# Patient Record
Sex: Male | Born: 1975
Health system: Southern US, Community
[De-identification: ages and names within clinical notes are randomized; demographics above are authoritative.]

## PROBLEM LIST (undated history)

## (undated) ENCOUNTER — Emergency Department (HOSPITAL_BASED_OUTPATIENT_CLINIC_OR_DEPARTMENT_OTHER): Admission: EM | Payer: BC Managed Care – PPO

## (undated) DIAGNOSIS — G4730 Sleep apnea, unspecified: Secondary | ICD-10-CM

## (undated) DIAGNOSIS — N289 Disorder of kidney and ureter, unspecified: Secondary | ICD-10-CM

## (undated) DIAGNOSIS — N189 Chronic kidney disease, unspecified: Secondary | ICD-10-CM

## (undated) DIAGNOSIS — K219 Gastro-esophageal reflux disease without esophagitis: Secondary | ICD-10-CM

## (undated) DIAGNOSIS — J189 Pneumonia, unspecified organism: Secondary | ICD-10-CM

## (undated) DIAGNOSIS — R7611 Nonspecific reaction to tuberculin skin test without active tuberculosis: Secondary | ICD-10-CM

## (undated) DIAGNOSIS — S62316A Displaced fracture of base of fifth metacarpal bone, right hand, initial encounter for closed fracture: Secondary | ICD-10-CM

## (undated) DIAGNOSIS — I10 Essential (primary) hypertension: Secondary | ICD-10-CM

## (undated) DIAGNOSIS — J45909 Unspecified asthma, uncomplicated: Secondary | ICD-10-CM

## (undated) HISTORY — DX: Chronic kidney disease, unspecified: N18.9

## (undated) HISTORY — DX: Nonspecific reaction to tuberculin skin test without active tuberculosis: R76.11

## (undated) HISTORY — DX: Essential (primary) hypertension: I10

## (undated) HISTORY — DX: Disorder of kidney and ureter, unspecified: N28.9

## (undated) HISTORY — PX: ADENOIDECTOMY: SUR15

## (undated) HISTORY — DX: Displaced fracture of base of fifth metacarpal bone, right hand, initial encounter for closed fracture: S62.316A

## (undated) HISTORY — DX: Gastro-esophageal reflux disease without esophagitis: K21.9

## (undated) HISTORY — DX: Unspecified asthma, uncomplicated: J45.909

---
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
## 2007-04-04 ENCOUNTER — Ambulatory Visit: Payer: Self-pay | Admitting: Internal Medicine

## 2007-04-04 DIAGNOSIS — K219 Gastro-esophageal reflux disease without esophagitis: Secondary | ICD-10-CM | POA: Insufficient documentation

## 2007-04-04 HISTORY — DX: Gastro-esophageal reflux disease without esophagitis: K21.9

## 2007-04-22 ENCOUNTER — Encounter: Payer: Self-pay | Admitting: Internal Medicine

## 2007-04-22 ENCOUNTER — Ambulatory Visit: Payer: Self-pay | Admitting: Internal Medicine

## 2007-04-22 DIAGNOSIS — H1030 Unspecified acute conjunctivitis, unspecified eye: Secondary | ICD-10-CM | POA: Insufficient documentation

## 2007-04-22 LAB — CONVERTED CEMR LAB

## 2007-04-23 LAB — CONVERTED CEMR LAB
Bilirubin, Direct: 0.2 mg/dL (ref 0.0–0.3)
CO2: 31 meq/L (ref 19–32)
Calcium: 9.3 mg/dL (ref 8.4–10.5)
Cholesterol: 186 mg/dL (ref 0–200)
Eosinophils Absolute: 0.4 10*3/uL (ref 0.0–0.6)
Eosinophils Relative: 4.1 % (ref 0.0–5.0)
GFR calc Af Amer: 65 mL/min
GFR calc non Af Amer: 54 mL/min
Glucose, Bld: 93 mg/dL (ref 70–99)
H Pylori IgG: NEGATIVE
Hemoglobin: 16.3 g/dL (ref 13.0–17.0)
Leukocytes, UA: NEGATIVE
Lymphocytes Relative: 33.5 % (ref 12.0–46.0)
MCV: 87.4 fL (ref 78.0–100.0)
Monocytes Absolute: 0.9 10*3/uL — ABNORMAL HIGH (ref 0.2–0.7)
Neutro Abs: 5.2 10*3/uL (ref 1.4–7.7)
Neutrophils Relative %: 52.9 % (ref 43.0–77.0)
Nitrite: NEGATIVE
Platelets: 249 10*3/uL (ref 150–400)
Potassium: 4.2 meq/L (ref 3.5–5.1)
Sodium: 141 meq/L (ref 135–145)
Total CHOL/HDL Ratio: 4.9
Total Protein: 7.5 g/dL (ref 6.0–8.3)
Triglycerides: 156 mg/dL — ABNORMAL HIGH (ref 0–149)
Urine Glucose: NEGATIVE mg/dL
WBC: 10 10*3/uL (ref 4.5–10.5)

## 2007-05-01 ENCOUNTER — Ambulatory Visit: Payer: Self-pay | Admitting: Internal Medicine

## 2007-05-01 DIAGNOSIS — M549 Dorsalgia, unspecified: Secondary | ICD-10-CM | POA: Insufficient documentation

## 2007-05-01 DIAGNOSIS — N259 Disorder resulting from impaired renal tubular function, unspecified: Secondary | ICD-10-CM

## 2007-05-01 HISTORY — DX: Disorder resulting from impaired renal tubular function, unspecified: N25.9

## 2007-05-02 ENCOUNTER — Encounter: Payer: Self-pay | Admitting: Internal Medicine

## 2007-05-06 ENCOUNTER — Encounter: Payer: Self-pay | Admitting: Internal Medicine

## 2007-05-06 LAB — CONVERTED CEMR LAB: Total Protein, Serum Electrophoresis: 7.7 g/dL (ref 6.0–8.3)

## 2007-05-09 ENCOUNTER — Encounter: Admission: RE | Admit: 2007-05-09 | Discharge: 2007-05-09 | Payer: Self-pay | Admitting: Internal Medicine

## 2007-05-13 LAB — CONVERTED CEMR LAB
Albumin, U: DETECTED %
Alpha 1, Urine: DETECTED % — AB
Free Kappa Lt Chains,Ur: 1.86 mg/dL — ABNORMAL HIGH (ref 0.04–1.51)
Free Kappa/Lambda Ratio: 11.63 — ABNORMAL HIGH (ref 0.46–4.00)
Time: 24
Volume, Urine: 900 mL

## 2008-01-17 ENCOUNTER — Ambulatory Visit: Payer: Self-pay | Admitting: Internal Medicine

## 2008-02-24 ENCOUNTER — Ambulatory Visit: Payer: Self-pay | Admitting: Internal Medicine

## 2008-02-24 LAB — CONVERTED CEMR LAB
BUN: 12 mg/dL (ref 6–23)
Bilirubin Urine: NEGATIVE
CO2: 30 meq/L (ref 19–32)
Chloride: 104 meq/L (ref 96–112)
Eosinophils Relative: 1.6 % (ref 0.0–5.0)
Glucose, Bld: 101 mg/dL — ABNORMAL HIGH (ref 70–99)
HCT: 47.4 % (ref 39.0–52.0)
Hemoglobin, Urine: NEGATIVE
Leukocytes, UA: NEGATIVE
Lymphocytes Relative: 34.4 % (ref 12.0–46.0)
Monocytes Absolute: 0.7 10*3/uL (ref 0.1–1.0)
Monocytes Relative: 7.4 % (ref 3.0–12.0)
Nitrite: NEGATIVE
Platelets: 234 10*3/uL (ref 150–400)
Potassium: 4.2 meq/L (ref 3.5–5.1)
RDW: 12.1 % (ref 11.5–14.6)
Sodium: 140 meq/L (ref 135–145)
Total Protein, Urine: NEGATIVE mg/dL
WBC: 9.4 10*3/uL (ref 4.5–10.5)
pH: 7.5 (ref 5.0–8.0)

## 2008-04-24 ENCOUNTER — Encounter: Payer: Self-pay | Admitting: Internal Medicine

## 2008-05-27 ENCOUNTER — Encounter: Payer: Self-pay | Admitting: Internal Medicine

## 2008-09-23 ENCOUNTER — Encounter: Payer: Self-pay | Admitting: Internal Medicine

## 2009-01-20 ENCOUNTER — Encounter: Payer: Self-pay | Admitting: Internal Medicine

## 2009-03-24 ENCOUNTER — Encounter: Payer: Self-pay | Admitting: Internal Medicine

## 2009-09-21 ENCOUNTER — Ambulatory Visit: Payer: Self-pay | Admitting: Internal Medicine

## 2009-09-21 DIAGNOSIS — I10 Essential (primary) hypertension: Secondary | ICD-10-CM

## 2009-09-21 HISTORY — DX: Essential (primary) hypertension: I10

## 2009-09-27 ENCOUNTER — Telehealth: Payer: Self-pay | Admitting: Internal Medicine

## 2009-09-27 ENCOUNTER — Encounter: Payer: Self-pay | Admitting: Internal Medicine

## 2009-09-27 LAB — CONVERTED CEMR LAB
AST: 22 units/L (ref 0–37)
BUN: 9 mg/dL (ref 6–23)
Bilirubin, Direct: 0.1 mg/dL (ref 0.0–0.3)
CO2: 21 meq/L (ref 19–32)
Calcium: 9.3 mg/dL (ref 8.4–10.5)
Cholesterol: 170 mg/dL (ref 0–200)
Glucose, Bld: 88 mg/dL (ref 70–99)
HCT: 47.1 % (ref 39.0–52.0)
Hemoglobin: 15.4 g/dL (ref 13.0–17.0)
Indirect Bilirubin: 0.5 mg/dL (ref 0.0–0.9)
MCHC: 32.7 g/dL (ref 30.0–36.0)
MCV: 85.9 fL (ref 78.0–100.0)
RBC: 5.48 M/uL (ref 4.22–5.81)
Sodium: 139 meq/L (ref 135–145)
TSH: 1.601 microintl units/mL (ref 0.350–4.500)
Total Bilirubin: 0.6 mg/dL (ref 0.3–1.2)
Total CHOL/HDL Ratio: 5.2
WBC: 8.3 10*3/uL (ref 4.0–10.5)

## 2009-10-08 ENCOUNTER — Encounter: Payer: Self-pay | Admitting: Internal Medicine

## 2010-03-09 NOTE — Assessment & Plan Note (Signed)
Summary: cpx/mhf rsc from bump/mhf   Vital Signs:  Patient profile:   35 year old male Height:      71 inches Weight:      249.25 pounds BMI:     34.89 O2 Sat:      97 % on Room air Temp:     98.4 degrees F oral Pulse rate:   81 / minute Pulse rhythm:   regular Resp:     18 per minute BP sitting:   122 / 80  (left arm) Cuff size:   large  Vitals Entered By: Glendell Docker CMA (September 21, 2009 2:48 PM)  O2 Flow:  Room air CC: CPX Is Patient Diabetic? No Pain Assessment Patient in pain? no       Does patient need assistance? Functional Status Self care Ambulation Normal Comments non fasting   Primary Care Kvon Mcilhenny:  Dondra Spry DO  CC:  CPX.  History of Present Illness: 35 y/o AA male for routine CPX relationship with partner ended he is raising his daughter on his own no change in wt  diet is suboptimal not exercising regularly  Preventive Screening-Counseling & Management  Alcohol-Tobacco     Alcohol drinks/day: 0     Smoking Status: never  Caffeine-Diet-Exercise     Caffeine use/day: None     Does Patient Exercise: yes  EKG  Procedure date:  09/21/2009  Findings:      Normal sinus rhythm with rate of:  66 bpm  Allergies (verified): No Known Drug Allergies  Past History:  Past Medical History: History of childhood asthma History of genital warts 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 Never Smoked Alcohol use-no Caffeine use/day:  None Does Patient Exercise:  yes  Review of Systems  The patient denies weight loss, weight gain, chest pain, dyspnea on exertion, prolonged cough,  abdominal pain, melena, hematochezia, severe indigestion/heartburn, and depression.    Physical Exam  General:  alert, well-developed, and well-nourished.   Head:  normocephalic and atraumatic.   Ears:  R ear normal and L ear normal.   Mouth:  Oral mucosa and oropharynx without lesions or exudates.  Teeth in good repair. Neck:  supple and no carotid bruits.   Lungs:  normal respiratory effort and normal breath sounds.   Heart:  normal rate, regular rhythm, no murmur, and no gallop.   Abdomen:  soft, non-tender, normal bowel sounds, and no masses.   Extremities:  No lower extremity edema  Neurologic:  cranial nerves II-XII intact and gait normal.   Psych:  normally interactive, good eye contact, not anxious appearing, and not depressed appearing.     Impression & Recommendations:  Problem # 1:  ROUTINE GENERAL MEDICAL [email or web address]  CARE FACL (ICD-V70.0) Reviewed adult health maintenance protocols. Pt counseled on diet and exercise.  Orders: EKG w/ Interpretation (93000) T-HIV Antibody  (Reflex) ([id number])  Td Booster: Tdap (01/17/2008)   Chol: 186 (04/22/2007)   HDL: 37.9 (04/22/2007)   LDL: 117 (04/22/2007)   TG: 156 (04/22/2007) TSH: 0.64 (04/22/2007)     Td Booster: Tdap (01/17/2008)  Chol: 186 (04/22/2007)   HDL: 37.9 (04/22/2007)   LDL: 117 (04/22/2007)   TG: 156 (04/22/2007) TSH: 0.64 (04/22/2007)     Complete Medication List: 1)  Azor 5-20 Mg Tabs (Amlodipine-olmesartan) .... Take 1 tablet by mouth once a day  Other Orders: T-Basic Metabolic Panel [id number]) T-Hepatic Function [id number]) T-Lipid Profile [id number]) T-CBC No Diff ([id number]) T-TSH ([id number])  Patient Instructions: 1)  Please schedule a follow-up appointment in 1 year. 2)  Limit calorie intake to 2000-2200 cal per day 3)  [email or web address]    Current Allergies (reviewed today): No known allergies

## 2010-03-09 NOTE — Letter (Signed)
   [facility] [address] [place], [place]  [address]  [place] Phone: [phone]      September 27, 2009   [facility] [address] [place], [place] [address]  RE:  LAB RESULTS  Dear  Mr. Dripps,  The following is an interpretation of your most recent lab tests.  Please take note of any instructions provided or changes to medications that have resulted from your lab work.  ELECTROLYTES:  Good - no changes needed  KIDNEY FUNCTION TESTS:  Stable - no changes needed  LIVER FUNCTION TESTS:  Good - no changes needed  LIPID PANEL:  Fair - review at your next visit Triglyceride: 156   Cholesterol: 170   LDL: 106   HDL: 33   Chol/HDL%:  5.2 Ratio  THYROID STUDIES:  Thyroid studies normal TSH: 1.601     CBC:  Good - no changes needed       Sincerely Yours,    Dr. Thomos Lemons  Appended Document:  mailed

## 2010-03-09 NOTE — Letter (Signed)
Summary: [facility]   Imported By: Lanelle Bal 11/04/2009 08:24:28  _____________________________________________________________________  External Attachment:    Type:   Image     Comment:   External Document

## 2010-03-09 NOTE — Progress Notes (Signed)
Summary: Lab Results  Phone Note Outgoing Call   Summary of Call: call pt - HIV is negative.   a letter will be sent re:  other lab results  Initial call taken by: D. Thomos Lemons DO,  September 27, 2009 11:20 PM  Follow-up for Phone Call        attempted to contact patient at [phone], no answer. A detailed voice message was left informing patient per Dr Artist Pais instructions Follow-up by: Glendell Docker CMA,  September 28, 2009 9:06 AM

## 2010-05-25 ENCOUNTER — Telehealth: Payer: Self-pay | Admitting: Internal Medicine

## 2010-05-25 DIAGNOSIS — Z0000 Encounter for general adult medical examination without abnormal findings: Secondary | ICD-10-CM

## 2010-05-25 NOTE — Telephone Encounter (Signed)
Pt has a cpe appt with dr. Artist Pais on 6.6.12. Last labs were on 8.22.12. Pt wanted to come in for before august. Please order labs.

## 2010-05-25 NOTE — Telephone Encounter (Signed)
BMET,  FLP, U/A - v70

## 2010-05-26 NOTE — Telephone Encounter (Signed)
Orders entered and faxed to the lab. Left message for pt to return my call.

## 2010-06-07 NOTE — Telephone Encounter (Signed)
Pt notified that orders have been sent to the lab and he was provided with lab hours.

## 2010-06-09 ENCOUNTER — Encounter: Payer: Self-pay | Admitting: Internal Medicine

## 2010-06-10 ENCOUNTER — Ambulatory Visit (INDEPENDENT_AMBULATORY_CARE_PROVIDER_SITE_OTHER): Payer: 59 | Admitting: Internal Medicine

## 2010-06-10 ENCOUNTER — Encounter: Payer: Self-pay | Admitting: Internal Medicine

## 2010-06-10 VITALS — BP 120/90 | HR 74 | Temp 98.4°F | Resp 18 | Ht 71.0 in | Wt 255.0 lb

## 2010-06-10 DIAGNOSIS — Z114 Encounter for screening for human immunodeficiency virus [HIV]: Secondary | ICD-10-CM

## 2010-06-10 DIAGNOSIS — Z1159 Encounter for screening for other viral diseases: Secondary | ICD-10-CM

## 2010-06-10 DIAGNOSIS — R51 Headache: Secondary | ICD-10-CM

## 2010-06-10 DIAGNOSIS — G8929 Other chronic pain: Secondary | ICD-10-CM | POA: Insufficient documentation

## 2010-06-10 MED ORDER — CYCLOBENZAPRINE HCL 5 MG PO TABS: 5.0000 mg | ORAL_TABLET | Freq: Three times a day (TID) | ORAL | Status: DC | PRN

## 2010-06-10 NOTE — Progress Notes (Signed)
  Subjective:    Patient ID: Erik Weber, male    DOB: [birth year], 35 y.o.   MRN: [id number]  HPI  Pt gets frontal headache.  Feeling like a throbbing / pounding sensation.  Sometimes triggered by laughing. Symptoms can last 2 hrs.  Has not tx ed with any otc meds.  No nausea.  No photophobia or sonophobia. Severity - 7 out of 10.  Mild headaches in high school.  No visual changes.   Mild blurry vision.   In has been a while since last eye exam.  Some neck pain.   Review of Systems  Past Medical History  Diagnosis Date  . Childhood asthma   . Genital warts   . Positive TB test   . Hypertension   . Chronic renal insufficiency     History   Social History  . Marital Status: Single    Spouse Name: N/A    Number of Children: N/A  . Years of Education: N/A   Occupational History  . Not on file.   Social History Main Topics  . Smoking status: Never Smoker   . Smokeless tobacco: Not on file  . Alcohol Use: Not on file  . Drug Use: Not on file  . Sexually Active: Not on file   Other Topics Concern  . 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 Tonsillectomy     1980's    Family History  Problem Relation Age of Onset  . Kidney cancer Mother 86  . Hypertension Father     age 27  . Other      no colon, no prostate cancer  . Lung cancer      unlce    No Known Allergies  Current Outpatient Prescriptions on File Prior to Visit  Medication Sig Dispense Refill  . amLODipine-olmesartan (AZOR) 5-20 MG per tablet Take 1 tablet by mouth daily.          BP 120/90  Pulse 74  Temp(Src) 98.4 F (36.9 C) (Oral)  Resp 18  Ht 5\' 11"  (1.803 m)  Wt 255 lb (115.667 kg)  BMI 35.57 kg/m2  SpO2 97%       Objective:   Physical Exam    Constitutional: He is oriented to person, place, and time. He appears well-developed and well-nourished. No distress.  Cardiovascular: Normal rate, regular rhythm and normal heart sounds.   Pulmonary/Chest: Effort normal and breath sounds normal. He has no wheezes. He has no rales.  Neurological: He is oriented to person, place, and time. He displays normal reflexes. No cranial nerve deficit. He exhibits normal muscle tone. Coordination normal.  Skin: Skin is warm and dry.  Psychiatric: His behavior is normal.  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

## 2010-06-10 NOTE — Assessment & Plan Note (Signed)
35 y/o AA male with chronic severe headaches triggered by laughing (7 months) Rule out intracranial lesion - obtain MRI of brain Pt to follow up with eye doctor to rule out ocular etiology Trial of muscle relaxers Patient advised to call office if symptoms persist or worsen. Reassess in 2 wks

## 2010-06-12 ENCOUNTER — Ambulatory Visit
Admission: RE | Admit: 2010-06-12 | Discharge: 2010-06-12 | Disposition: A | Discharge status: Home / Self Care | Payer: 59 | Source: Ambulatory Visit | Attending: Internal Medicine | Admitting: Internal Medicine

## 2010-06-12 DIAGNOSIS — G8929 Other chronic pain: Secondary | ICD-10-CM

## 2010-06-14 LAB — URINALYSIS, MICROSCOPIC ONLY: Crystals: NONE SEEN

## 2010-06-14 LAB — URINALYSIS, ROUTINE W REFLEX MICROSCOPIC
Glucose, UA: NEGATIVE mg/dL
Nitrite: NEGATIVE
Protein, ur: NEGATIVE mg/dL

## 2010-06-24 ENCOUNTER — Ambulatory Visit: Payer: Self-pay | Admitting: Internal Medicine

## 2010-07-13 ENCOUNTER — Encounter: Payer: Self-pay | Admitting: Internal Medicine

## 2010-07-28 ENCOUNTER — Encounter: Payer: Self-pay | Admitting: Internal Medicine

## 2010-08-05 ENCOUNTER — Ambulatory Visit (INDEPENDENT_AMBULATORY_CARE_PROVIDER_SITE_OTHER): Payer: 59 | Admitting: Family

## 2010-08-05 ENCOUNTER — Encounter: Payer: Self-pay | Admitting: Family

## 2010-08-05 DIAGNOSIS — Z206 Contact with and (suspected) exposure to human immunodeficiency virus [HIV]: Secondary | ICD-10-CM

## 2010-08-05 DIAGNOSIS — Z20828 Contact with and (suspected) exposure to other viral communicable diseases: Secondary | ICD-10-CM

## 2010-08-05 DIAGNOSIS — Z0000 Encounter for general adult medical examination without abnormal findings: Secondary | ICD-10-CM

## 2010-08-05 LAB — TSH: TSH: 1.224 u[IU]/mL (ref 0.350–4.500)

## 2010-08-05 LAB — BASIC METABOLIC PANEL
BUN: 15 mg/dL (ref 6–23)
CO2: 27 mEq/L (ref 19–32)
Chloride: 102 mEq/L (ref 96–112)
Creat: 1.65 mg/dL — ABNORMAL HIGH (ref 0.50–1.35)

## 2010-08-05 LAB — HEPATIC FUNCTION PANEL
ALT: 20 U/L (ref 0–53)
Indirect Bilirubin: 0.5 mg/dL (ref 0.0–0.9)
Total Protein: 8 g/dL (ref 6.0–8.3)

## 2010-08-05 LAB — LIPID PANEL
Cholesterol: 204 mg/dL — ABNORMAL HIGH (ref 0–200)
VLDL: 29 mg/dL (ref 0–40)

## 2010-08-05 NOTE — Progress Notes (Signed)
Subjective:    Patient ID: Erik Weber, male    DOB: 04-Jun-1975, 35 y.o.   MRN: [id number]  HPI   Preventative- going to the gym every day.  Diet is "ok".  He is working with a Scientific laboratory technician.  Trying to keep to 2500.   Uses app on phone. Patient is up to date with his tetanus shot.  Pt reports + exposure to someone with known HIV last summer.  He had a neg HIV antibody, but is requesting an HIV viral load.   Review of Systems  Constitutional:       Difficulty losing weight  HENT: Negative for hearing loss.        Told that he has adenoid inflammation and fluid in his ears- Sees Dr. Jearld Fenton at [facility]  Eyes: Negative for visual disturbance.  Respiratory: Negative for shortness of breath.   Cardiovascular: Negative for chest pain.  Gastrointestinal: Negative for nausea, vomiting, diarrhea and blood in stool.  Genitourinary: Negative for dysuria and frequency.  Musculoskeletal: Negative for myalgias and arthralgias.  Neurological: Negative for weakness and numbness.  Hematological: Negative for adenopathy.  Psychiatric/Behavioral:       Denies depression or anxiety   Past Medical History  Diagnosis Date  . Childhood asthma   . Genital warts   . Positive TB test   . Hypertension   . Chronic renal insufficiency     History   Social History  . Marital Status: Single    Spouse Name: N/A    Number of Children: N/A  . Years of Education: N/A   Occupational History  . Not on file.   Social History Main Topics  . Smoking status: Never Smoker   . Smokeless tobacco: Not on file  . Alcohol Use: Not on file  . Drug Use: Not on file  . Sexually Active: Not on file   Other Topics Concern  . 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 Tonsillectomy    1980's    Family History  Problem Relation Age of Onset  . Kidney cancer Mother 5  . Hypertension Father     age 64  . Other      no colon, no prostate cancer  . Lung cancer      unlce    No Known Allergies  Current Outpatient Prescriptions on File Prior to Visit  Medication Sig Dispense Refill  . amLODipine-olmesartan (AZOR) 5-20 MG per tablet Take 1 tablet by mouth daily.        . cyclobenzaprine (FLEXERIL) 5 MG tablet Take 1 tablet (5 mg total) by mouth 3 (three) times daily as needed.  30 tablet  0  . HYDROcodone-acetaminophen (VICODIN) 5-500 MG per tablet Take 1 tablet by mouth every 4 (four) hours as needed. For tooth pain       . ibuprofen (ADVIL,MOTRIN) 600 MG tablet Take 600 mg by mouth every 4 (four) hours as needed. For Tooth extraction pain       . Penicillin V Potassium (PEN-VEE K PO) Take by mouth 4 (four) times daily. X 10 days         BP 126/90  Pulse 74  Temp(Src) 98.5 F (36.9 C) (Oral)  Wt 255 lb (115.667 kg)       Objective:  Physical Exam  Constitutional: He is oriented to person, place, and time. He appears well-developed and well-nourished.  HENT:  Head: Normocephalic and atraumatic.  Right Ear: Tympanic membrane normal.  Left Ear: Tympanic membrane normal.       Some erythema noted in bilatera canals  Eyes: Conjunctivae and EOM are normal. Pupils are equal, round, and reactive to light.  Neck: Normal range of motion. Neck supple. No tracheal deviation present. No thyromegaly present.  Cardiovascular: Normal rate and regular rhythm.   No murmur heard. Pulmonary/Chest: Effort normal and breath sounds normal. No respiratory distress. He has no wheezes. He has no rales.  Abdominal: Soft. Bowel sounds are normal. He exhibits no distension. There is no tenderness. There is no rebound.  Musculoskeletal: Normal range of motion.  Neurological: He is alert and oriented to person, place, and time. Coordination normal.  Skin: Skin is warm and dry.    Psychiatric: He has a normal mood and affect. His behavior is normal. Judgment and thought content normal.          Assessment & Plan:

## 2010-08-05 NOTE — Assessment & Plan Note (Signed)
Pt was counseled on diet, exercise and weight loss.  I recommended that he keep his calories to 1800 or less a day. Will order HIV viral load at patient's request.  Immunizations reviewed and up to date. Patient was counseled on safe sex.

## 2010-08-05 NOTE — Patient Instructions (Addendum)
Try to keep your calories to 1800 a day. Continue your exercise regimen as you are. Please complete your blood work on the first floor.

## 2010-08-06 LAB — CBC
Hemoglobin: 15.5 g/dL (ref 13.0–17.0)
MCH: 28.5 pg (ref 26.0–34.0)
RBC: 5.43 MIL/uL (ref 4.22–5.81)

## 2010-08-13 ENCOUNTER — Telehealth: Payer: Self-pay | Admitting: Family

## 2010-08-13 DIAGNOSIS — N289 Disorder of kidney and ureter, unspecified: Secondary | ICD-10-CM

## 2010-08-13 NOTE — Telephone Encounter (Signed)
Pls call patient and let him know that his kidney function has worsened slightly.  I would like for him to establish with a kidney doctor for consult.  I will make referral and Myriam Jacobson will call him with apt.

## 2010-08-15 NOTE — Telephone Encounter (Signed)
Notified pt and he states he sees a doctor with [facility] and has a follow up in August.

## 2010-08-29 ENCOUNTER — Telehealth: Payer: Self-pay | Admitting: Family

## 2010-08-29 NOTE — Telephone Encounter (Signed)
Please call pt and let him know that for some reason the HIV RNA Quant screen was not run.  If he would still like to have this test completed, we will need him to return to the lab please.

## 2010-08-29 NOTE — Telephone Encounter (Signed)
Pt notified and states that he does not want to have the test run. He states that he had an HIV screen that was negative in May and is satisfied with that result.

## 2011-01-11 ENCOUNTER — Ambulatory Visit (INDEPENDENT_AMBULATORY_CARE_PROVIDER_SITE_OTHER): Payer: 59 | Admitting: Internal Medicine

## 2011-01-11 ENCOUNTER — Telehealth: Payer: Self-pay | Admitting: Internal Medicine

## 2011-01-11 VITALS — BP 124/94 | Temp 98.1°F | Wt 253.0 lb

## 2011-01-11 DIAGNOSIS — H1030 Unspecified acute conjunctivitis, unspecified eye: Secondary | ICD-10-CM

## 2011-01-11 MED ORDER — TOBRAMYCIN-DEXAMETHASONE 0.3-0.1 % OP OINT: TOPICAL_OINTMENT | Freq: Three times a day (TID) | OPHTHALMIC | Status: DC

## 2011-01-11 NOTE — Progress Notes (Signed)
  Subjective:    Patient ID: Erik Weber, male    DOB: Jan 04, 1976, 35 y.o.   MRN: [id number]  Conjunctivitis  The current episode started yesterday. The onset was sudden. The problem has been gradually worsening. The problem is moderate. Associated symptoms include eye discharge and eye redness. Pertinent negatives include no fever, no decreased vision, no double vision and no photophobia.      Review of Systems  Constitutional: Negative for fever.  Eyes: Positive for discharge and redness. Negative for double vision and photophobia.   Past Medical History  Diagnosis Date  . Childhood asthma   . Genital warts   . Positive TB test   . Hypertension   . Chronic renal insufficiency     History   Social History  . Marital Status: Single    Spouse Name: N/A    Number of Children: N/A  . Years of Education: N/A   Occupational History  . Not on file.   Social History Main Topics  . Smoking status: Never Smoker   . Smokeless tobacco: Not on file  . Alcohol Use: Not on file  . Drug Use: Not on file  . Sexually Active: Not on file   Other Topics Concern  . 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 Tonsillectomy     1980's    Family History  Problem Relation Age of Onset  . Kidney cancer Mother 12  . Hypertension Father     age 51  . Other      no colon, no prostate cancer  . Lung cancer      unlce    No Known Allergies  Current Outpatient Prescriptions on File Prior to Visit  Medication Sig Dispense Refill  . amLODipine-olmesartan (AZOR) 5-20 MG per tablet Take 1 tablet by mouth daily.        . cyclobenzaprine (FLEXERIL) 5 MG tablet Take 1 tablet (5 mg total) by mouth 3 (three) times daily as needed.  30 tablet  0  .  HYDROcodone-acetaminophen (VICODIN) 5-500 MG per tablet Take 1 tablet by mouth every 4 (four) hours as needed. For tooth pain       . ibuprofen (ADVIL,MOTRIN) 600 MG tablet Take 600 mg by mouth every 4 (four) hours as needed. For Tooth extraction pain       . Penicillin V Potassium (PEN-VEE K PO) Take by mouth 4 (four) times daily. X 10 days         BP 124/94  Temp(Src) 98.1 F (36.7 C) (Oral)  Wt 253 lb (114.76 kg)       Objective:   Physical Exam  Constitutional: He appears well-developed and well-nourished.  HENT:  Head: Normocephalic and atraumatic.  Eyes: Pupils are equal, round, and reactive to light.       Bilateral conjunctival injection left greater than right Mild discharge  Cardiovascular: Normal rate, regular rhythm and normal heart sounds.   Pulmonary/Chest: Effort normal. No respiratory distress. He has no wheezes.          Assessment & Plan:

## 2011-01-11 NOTE — Patient Instructions (Signed)
Please call our office if your symptoms do not improve or gets worse.  

## 2011-01-11 NOTE — Telephone Encounter (Signed)
Pt needs to be seen.  We can not call in rx without evaluation

## 2011-01-11 NOTE — Telephone Encounter (Signed)
Pt is sch for today 430pm

## 2011-01-11 NOTE — Telephone Encounter (Signed)
Pt has conjunctivitis and wants an rx called in for him. Please contact

## 2011-01-11 NOTE — Assessment & Plan Note (Signed)
Patient with bilateral conjunctivitis. He reports significant eye discharge especially in AM.  His vision is normal and pupillary responses normal. Treat with TobraDex ointment 3 times a day. Patient advised to call office if symptoms persist or worsen.

## 2011-01-13 ENCOUNTER — Telehealth: Payer: Self-pay | Admitting: *Deleted

## 2011-01-13 MED ORDER — ERYTHROMYCIN 5 MG/GM OP OINT: TOPICAL_OINTMENT | Freq: Four times a day (QID) | OPHTHALMIC | Status: AC

## 2011-01-13 NOTE — Telephone Encounter (Signed)
Patient is calling because the co-pay for his Rx for pink eye will be $100 and he would like to know if there is something else he could try?

## 2011-01-16 NOTE — Telephone Encounter (Signed)
rx of eythromycin ophthalmic called in last week.

## 2011-01-16 NOTE — Telephone Encounter (Signed)
Pt aware.

## 2011-03-31 ENCOUNTER — Other Ambulatory Visit: Payer: 59

## 2011-04-04 ENCOUNTER — Other Ambulatory Visit (INDEPENDENT_AMBULATORY_CARE_PROVIDER_SITE_OTHER): Payer: 59

## 2011-04-04 DIAGNOSIS — Z0000 Encounter for general adult medical examination without abnormal findings: Secondary | ICD-10-CM

## 2011-04-04 LAB — HEPATIC FUNCTION PANEL
Albumin: 4.4 g/dL (ref 3.5–5.2)
Bilirubin, Direct: 0 mg/dL (ref 0.0–0.3)
Total Protein: 7.5 g/dL (ref 6.0–8.3)

## 2011-04-04 LAB — LIPID PANEL: Cholesterol: 189 mg/dL (ref 0–200)

## 2011-04-04 LAB — CBC WITH DIFFERENTIAL/PLATELET
Basophils Relative: 0.8 % (ref 0.0–3.0)
Eosinophils Absolute: 0.3 10*3/uL (ref 0.0–0.7)
MCHC: 32.8 g/dL (ref 30.0–36.0)
MCV: 86.5 fl (ref 78.0–100.0)
Monocytes Absolute: 0.7 10*3/uL (ref 0.1–1.0)
Neutro Abs: 4.2 10*3/uL (ref 1.4–7.7)
Neutrophils Relative %: 46 % (ref 43.0–77.0)
RBC: 5.24 Mil/uL (ref 4.22–5.81)
RDW: 13.8 % (ref 11.5–14.6)

## 2011-04-04 LAB — POCT URINALYSIS DIPSTICK
Leukocytes, UA: NEGATIVE
Protein, UA: NEGATIVE
Urobilinogen, UA: 0.2

## 2011-04-04 LAB — BASIC METABOLIC PANEL
BUN: 16 mg/dL (ref 6–23)
CO2: 27 mEq/L (ref 19–32)
Chloride: 104 mEq/L (ref 96–112)
Creatinine, Ser: 1.5 mg/dL (ref 0.4–1.5)
Glucose, Bld: 82 mg/dL (ref 70–99)

## 2011-04-07 ENCOUNTER — Ambulatory Visit (INDEPENDENT_AMBULATORY_CARE_PROVIDER_SITE_OTHER): Payer: 59 | Admitting: Internal Medicine

## 2011-04-07 ENCOUNTER — Encounter: Payer: Self-pay | Admitting: Internal Medicine

## 2011-04-07 DIAGNOSIS — N259 Disorder resulting from impaired renal tubular function, unspecified: Secondary | ICD-10-CM

## 2011-04-07 DIAGNOSIS — Z0000 Encounter for general adult medical examination without abnormal findings: Secondary | ICD-10-CM

## 2011-04-07 MED ORDER — AMLODIPINE-OLMESARTAN 5-20 MG PO TABS: 1.0000 | ORAL_TABLET | Freq: Every day | ORAL | Status: DC

## 2011-04-07 NOTE — Progress Notes (Signed)
Subjective:    Patient ID: Erik Weber, male    DOB: 02/01/1976, 36 y.o.   MRN: [id number]  HPI  36 year old African American male with hypertension and associated renal insufficiency for routine physical. The patient denies significant interval medical history. His renal function/ Cr has been stable at 1.5. Patient has been trying to exercise on regular basis. He is going to the gym and lifting weights. There has been mild weight gain since previous visit.  No significant change in social history. Denies high-risk sexual contact. HIV screening was negative.  Screening labs reviewed in detail the patient  Review of Systems  Constitutional: Negative for activity change, appetite change  Eyes: Negative for visual disturbance.  Respiratory: Negative for cough, chest tightness and shortness of breath.   Cardiovascular: Negative for chest heaviness  Genitourinary:  increased frequency of urination Neurological: Negative for headaches.  Gastrointestinal: Negative for abdominal pain, heartburn melena or hematochezia Psych: Negative for depression or anxiety  Past Medical History  Diagnosis Date  . Childhood asthma   . Genital warts   . Positive TB test   . Hypertension   . Chronic renal insufficiency     History   Social History  . Marital Status: Single    Spouse Name: N/A    Number of Children: N/A  . Years of Education: N/A   Occupational History  . Not on file.   Social History Main Topics  . Smoking status: Never Smoker   . Smokeless tobacco: Not on file  . Alcohol Use: Not on file  . Drug Use: Not on file  . Sexually Active: Not on file   Other Topics Concern  . 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 Tonsillectomy     1980's     Family History  Problem Relation Age of Onset  . Kidney cancer Mother 63  . Hypertension Father     age 58  . Other      no colon, no prostate cancer  . Lung cancer      unlce    No Known Allergies  No current outpatient prescriptions on file prior to visit.    BP 122/82  Pulse 60  Temp(Src) 98.4 F (36.9 C) (Oral)  Ht 5\' 11"  (1.803 m)  Wt 263 lb (119.296 kg)  BMI 36.68 kg/m2          Objective:   Physical Exam  Constitutional: He is oriented to person, place, and time. He appears well-developed and well-nourished.  HENT:  Head: Normocephalic and atraumatic.  Right Ear: External ear normal.  Mouth/Throat: Oropharynx is clear and moist.  Eyes: Conjunctivae are normal. Pupils are equal, round, and reactive to light.  Neck: Normal range of motion. Neck supple.  Cardiovascular: Normal rate, regular rhythm and normal heart sounds.   No murmur heard. Pulmonary/Chest: Effort normal and breath sounds normal. He has no wheezes. He has no rales.  Abdominal: Soft. Bowel sounds are normal. He exhibits no mass.  Musculoskeletal: Normal range of motion.       Trace edema bilaterally  Neurological: He is alert and oriented to person, place, and time.  Skin: Skin is warm and dry. No rash noted.  Psychiatric: He has a normal mood and affect. His behavior is normal.  Assessment & Plan:

## 2011-04-07 NOTE — Patient Instructions (Signed)
Please complete the following lab tests before your next follow up appointment: BMET - 401.9 

## 2011-04-07 NOTE — Assessment & Plan Note (Signed)
Patient followed by nephrologist-Dr. Lowell Guitar. 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 Stressed importance of compliance with antihypertensives. Also stressed the need to avoid NSAIDs/nephrotoxic agents. Cr is stable Lab Results  Component Value Date   CREATININE 1.5 04/04/2011

## 2011-04-07 NOTE — Assessment & Plan Note (Signed)
Reviewed adult health maintenance protocols. We discussed weight loss strategies. Goal weight approximately 220 pounds. Blood pressure is well controlled.  Lipid panel is within acceptable limits.

## 2011-06-02 ENCOUNTER — Ambulatory Visit (INDEPENDENT_AMBULATORY_CARE_PROVIDER_SITE_OTHER): Payer: 59 | Admitting: Internal Medicine

## 2011-06-02 ENCOUNTER — Encounter: Payer: Self-pay | Admitting: Internal Medicine

## 2011-06-02 VITALS — BP 122/82 | HR 80 | Temp 98.2°F | Wt 272.0 lb

## 2011-06-02 DIAGNOSIS — N529 Male erectile dysfunction, unspecified: Secondary | ICD-10-CM

## 2011-06-02 DIAGNOSIS — R635 Abnormal weight gain: Secondary | ICD-10-CM

## 2011-06-02 LAB — TSH: TSH: 0.53 u[IU]/mL (ref 0.35–5.50)

## 2011-06-02 LAB — T4, FREE: Free T4: 0.87 ng/dL (ref 0.60–1.60)

## 2011-06-02 NOTE — Assessment & Plan Note (Signed)
Abnormal weight gain of unclear etiology. Obtain TSH and free T4. Patient advised to stop weight lifting and increase cardiovascular exercises. Patient also advised to follow low-salt diet.

## 2011-06-02 NOTE — Progress Notes (Signed)
  Subjective:    Patient ID: Erik Weber, male    DOB: 07/08/1975, 36 y.o.   MRN: [id number]  HPI  36 year old African American male with history of hypertension and chronic renal insufficiency complains of unusual weight gain over the last several months. He has not changed his eating habits. He has seen a nutritionist in the past and is following approximately 2500-calorie per day diet. He also exercises on a regular basis which includes weight lifting. He denies lower extremity edema. He denies shortness of breath.  Review of Systems  Positive for fatigue, decreased libido  Past Medical History  Diagnosis Date  . Childhood asthma   . Genital warts   . Positive TB test   . Hypertension   . Chronic renal insufficiency     History   Social History  . Marital Status: Single    Spouse Name: N/A    Number of Children: N/A  . Years of Education: N/A   Occupational History  . Not on file.   Social History Main Topics  . Smoking status: Never Smoker   . Smokeless tobacco: Not on file  . Alcohol Use: Not on file  . Drug Use: Not on file  . Sexually Active: Not on file   Other Topics Concern  . 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 Tonsillectomy     1980's    Family History  Problem Relation Age of Onset  . Kidney cancer Mother 15  . Hypertension Father     age 43  . Other      no colon, no prostate cancer  . Lung cancer      unlce    No Known Allergies  No current outpatient prescriptions on file prior to visit.    BP 122/82  Pulse 80  Temp(Src) 98.2 F (36.8 C) (Oral)  Wt 272 lb (123.378 kg)       Objective:   Physical Exam  Constitutional: He is oriented to person, place, and time. He appears well-developed and  well-nourished.  Cardiovascular: Normal rate, regular rhythm and normal heart sounds.   Pulmonary/Chest: Effort normal and breath sounds normal. He has no wheezes.  Musculoskeletal:       Trace lower extremity edema bilaterally  Neurological: He is alert and oriented to person, place, and time.  Skin: Skin is warm and dry.  Psychiatric: He has a normal mood and affect. His behavior is normal.      Assessment & Plan:

## 2011-06-05 ENCOUNTER — Telehealth: Payer: Self-pay | Admitting: *Deleted

## 2011-06-05 LAB — TESTOSTERONE, FREE, TOTAL, SHBG
Sex Hormone Binding: 17 nmol/L (ref 13–71)
Testosterone-% Free: 2.7 % (ref 1.6–2.9)
Testosterone: 243.48 ng/dL — ABNORMAL LOW (ref 300–890)

## 2011-06-05 NOTE — Progress Notes (Signed)
Pt informed

## 2011-06-05 NOTE — Telephone Encounter (Signed)
I suggest OV to discuss pros and cons of testosterone replacement

## 2011-06-05 NOTE — Telephone Encounter (Signed)
Message was given to pt re: low testosterone and weight loss- pt states he has been working out and losing weight but he states it is not helping low testosterone sx-- wants to know if there is any medication he can take that will help with sx?

## 2011-06-05 NOTE — Telephone Encounter (Signed)
Spoke with patient and he will call back for an office visit

## 2011-06-07 ENCOUNTER — Encounter: Payer: Self-pay | Admitting: Internal Medicine

## 2011-06-07 ENCOUNTER — Ambulatory Visit (INDEPENDENT_AMBULATORY_CARE_PROVIDER_SITE_OTHER): Payer: 59 | Admitting: Internal Medicine

## 2011-06-07 VITALS — BP 134/82 | HR 68 | Temp 98.1°F | Ht 71.0 in | Wt 274.0 lb

## 2011-06-07 DIAGNOSIS — E291 Testicular hypofunction: Secondary | ICD-10-CM

## 2011-06-07 HISTORY — DX: Testicular hypofunction: E29.1

## 2011-06-07 NOTE — Progress Notes (Signed)
  Subjective:    Patient ID: Erik Weber, male    DOB: [birth year], 36 y.o.   MRN: [id number]  HPI  36 year old African American male complains of symptoms of hypogonadism. He notes decreased libido over the last 6-7 months. He initially attributed to stress. He also complains of chronic fatigue. Patient also reports muscles ache after exercise. He denies any recent illness. He has occasional headaches. No changes in vision.  Serum testosterone -  Lab Results  Component Value Date   TESTOSTERONE 243.48* 06/02/2011     Review of Systems Weight gain, mild gynecomastia  Past Medical History  Diagnosis Date  . Childhood asthma   . Genital warts   . Positive TB test   . Hypertension   . Chronic renal insufficiency     History   Social History  . Marital Status: Single    Spouse Name: N/A    Number of Children: N/A  . Years of Education: N/A   Occupational History  . Not on file.   Social History Main Topics  . Smoking status: Never Smoker   . Smokeless tobacco: Not on file  . Alcohol Use: Not on file  . Drug Use: Not on file  . Sexually Active: Not on file   Other Topics Concern  . 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 Tonsillectomy     1980's    Family History  Problem Relation Age of Onset  . Kidney cancer Mother 71  . Hypertension Father     age 12  . Other      no colon, no prostate cancer  . Lung cancer      unlce    No Known Allergies  Current Outpatient Prescriptions on File Prior to Visit  Medication Sig Dispense Refill  . amlodipine-olmesartan (AZOR) 10-20 MG per tablet Take 1 tablet by mouth daily.        BP 134/82  Pulse 68  Temp(Src) 98.1 F (36.7 C) (Oral)  Ht 5\' 11"  (1.803 m)  Wt 274 lb (124.286 kg)   BMI 38.22 kg/m2       Objective:   Physical Exam  Constitutional: He is oriented to person, place, and time. He appears well-developed and well-nourished.  Eyes: EOM are normal. Pupils are equal, round, and reactive to light.       No defect in peripheral vision  Cardiovascular: Normal rate, regular rhythm and normal heart sounds.   Pulmonary/Chest: Effort normal and breath sounds normal. He has no wheezes. He has no rales.       Mild gynecomastia  Genitourinary: Penis normal.       Testicular exam normal  Neurological: He is alert and oriented to person, place, and time. No cranial nerve deficit.  Skin: Skin is warm and dry.  Psychiatric: He has a normal mood and affect. His behavior is normal.       Assessment & Plan:

## 2011-06-07 NOTE — Assessment & Plan Note (Signed)
36 year old [place] American male with mild hypogonadism. Check prolactin, LH, IBC and ferritin level.  His symptoms may be related to obesity. We discussed pros and cons of testosterone therapy. If workup negative we discussed starting testosterone replacement with goal testosterone level between 400-500. Obtain baseline PSA.

## 2011-06-08 LAB — LUTEINIZING HORMONE: LH: 5.37 m[IU]/mL (ref 1.50–9.30)

## 2011-06-08 LAB — PROLACTIN: Prolactin: 8.5 ng/mL (ref 2.1–17.1)

## 2011-06-09 ENCOUNTER — Telehealth: Payer: Self-pay | Admitting: *Deleted

## 2011-06-09 ENCOUNTER — Ambulatory Visit: Payer: 59 | Admitting: Internal Medicine

## 2011-06-09 MED ORDER — TESTOSTERONE 12.5 MG/ACT (1%) TD GEL: 4.0000 | Freq: Every day | TRANSDERMAL | Status: DC

## 2011-06-12 ENCOUNTER — Telehealth: Payer: Self-pay | Admitting: Internal Medicine

## 2011-06-12 MED ORDER — TESTOSTERONE 50 MG/5GM (1%) TD GEL: 5.0000 g | Freq: Every day | TRANSDERMAL | Status: DC

## 2011-06-12 NOTE — Telephone Encounter (Signed)
rx faxed to pharmacy, pt aware 

## 2011-06-12 NOTE — Telephone Encounter (Signed)
Pt is going to call insurance company and find out what they cover and call us back

## 2011-06-12 NOTE — Telephone Encounter (Signed)
Call pt - rx changed to Testim and sent to [facility]

## 2011-06-12 NOTE — Telephone Encounter (Signed)
Pt called back and knows that the testim is covered and wants to know if Dr Artist Pais would change his rx

## 2011-06-12 NOTE — Telephone Encounter (Signed)
Addended by: Simeon Craft on: 06/12/2011 01:37 PM   Modules accepted: Orders, Medications

## 2011-06-12 NOTE — Telephone Encounter (Signed)
Pt said that Androgel in not covered by pts insurance. Pt needs to know what meds are covered? Pt said that Testim is covered, but pt wants to know what Dr Artist Pais would recommend. Pls call.

## 2011-06-13 ENCOUNTER — Telehealth: Payer: Self-pay | Admitting: Internal Medicine

## 2011-06-13 MED ORDER — TESTOSTERONE 50 MG/5GM (1%) TD GEL: 5.0000 g | Freq: Every day | TRANSDERMAL | Status: DC

## 2011-06-13 NOTE — Telephone Encounter (Signed)
rx faxed to pharmacy

## 2011-06-13 NOTE — Telephone Encounter (Signed)
rx called in, pt aware 

## 2011-06-13 NOTE — Telephone Encounter (Signed)
[facility] can not read fax for testosterone please re fax

## 2011-06-13 NOTE — Telephone Encounter (Signed)
Pt contacted the [facility] about testim, [facility] stated they did not receive script pt requesting to have script resent to [facility]

## 2011-07-10 ENCOUNTER — Encounter: Payer: Self-pay | Admitting: Internal Medicine

## 2011-07-10 ENCOUNTER — Ambulatory Visit (INDEPENDENT_AMBULATORY_CARE_PROVIDER_SITE_OTHER): Payer: 59 | Admitting: Internal Medicine

## 2011-07-10 VITALS — BP 154/102 | HR 72 | Temp 98.8°F | Wt 268.0 lb

## 2011-07-10 DIAGNOSIS — E291 Testicular hypofunction: Secondary | ICD-10-CM

## 2011-07-10 DIAGNOSIS — I10 Essential (primary) hypertension: Secondary | ICD-10-CM

## 2011-07-10 NOTE — Assessment & Plan Note (Signed)
The patient has mild increase in blood pressure since starting testosterone replacement. Continue to monitor, if persistent elevation we discussed increasing Azor dose to 40/10.

## 2011-07-10 NOTE — Assessment & Plan Note (Addendum)
Patient using testosterone gel but has not noticed significant change in symptomatology. Repeat testosterone level today. She would like to either consider changing to axiron gel versus testosterone injections. Lab Results  Component Value Date   TESTOSTERONE 243.48* 06/02/2011   Baseline PSA was normal. Plan to repeat PSA in 6 months. Lab Results  Component Value Date   PSA 0.30 06/07/2011

## 2011-07-10 NOTE — Progress Notes (Signed)
  Subjective:    Patient ID: Erik Weber, male    DOB: 12/25/1975, 36 y.o.   MRN: [id number]  HPI  36 year old [place] American male for followup regarding hypogonadism. Patient found to have mildly depressed testosterone levels. Prolactin level and iron studies were normal. LH was also normal. Patient was started on testosterone gel 5 g daily. He has not noticed significant improvement. Application of gel is somewhat inconvenient. He has a 80-year-old son who he he is trying to avoid exposure to gel.  Review of Systems Blood pressure increased,  Negative for chest pain or headache  Past Medical History  Diagnosis Date  . Childhood asthma   . Genital warts   . Positive TB test   . Hypertension   . Chronic renal insufficiency     History   Social History  . Marital Status: Single    Spouse Name: N/A    Number of Children: N/A  . Years of Education: N/A   Occupational History  . Not on file.   Social History Main Topics  . Smoking status: Never Smoker   . Smokeless tobacco: Not on file  . Alcohol Use: Not on file  . Drug Use: Not on file  . Sexually Active: Not on file   Other Topics Concern  . 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 Tonsillectomy     1980's    Family History  Problem Relation Age of Onset  . Kidney cancer Mother 85  . Hypertension Father     age 37  . Other      no colon, no prostate cancer  . Lung cancer      unlce    No Known Allergies  Current Outpatient Prescriptions on File Prior to Visit  Medication Sig Dispense Refill  . amlodipine-olmesartan (AZOR) 10-20 MG per tablet Take 1 tablet by mouth daily.      Marland Kitchen testosterone (TESTIM) 50 MG/5GM GEL Place 5 g onto the skin daily.  5 g  2    BP 154/102   Pulse 72  Temp(Src) 98.8 F (37.1 C) (Oral)  Wt 268 lb (121.564 kg)       Objective:   Physical Exam  Constitutional: He appears well-developed and well-nourished.  Cardiovascular: Normal rate, regular rhythm and normal heart sounds.   Pulmonary/Chest: Effort normal and breath sounds normal. He has no wheezes.  Musculoskeletal: He exhibits no edema.  Skin: Skin is warm and dry.          Assessment & Plan:

## 2011-07-11 LAB — TESTOSTERONE, FREE, TOTAL, SHBG
Sex Hormone Binding: 18 nmol/L (ref 13–71)
Testosterone-% Free: 2.6 % (ref 1.6–2.9)
Testosterone: 235.17 ng/dL — ABNORMAL LOW (ref 300–890)

## 2011-07-13 ENCOUNTER — Other Ambulatory Visit: Payer: Self-pay | Admitting: *Deleted

## 2011-07-13 MED ORDER — TESTOSTERONE CYPIONATE 200 MG/ML IM SOLN: 200.0000 mg | INTRAMUSCULAR | Status: DC

## 2011-07-17 ENCOUNTER — Ambulatory Visit (INDEPENDENT_AMBULATORY_CARE_PROVIDER_SITE_OTHER): Payer: 59 | Admitting: Internal Medicine

## 2011-07-17 DIAGNOSIS — E349 Endocrine disorder, unspecified: Secondary | ICD-10-CM

## 2011-07-17 DIAGNOSIS — E291 Testicular hypofunction: Secondary | ICD-10-CM

## 2011-07-17 MED ORDER — "SYRINGE 22G X 1"" 3 ML MISC": 1.0000 | Status: DC

## 2011-07-17 MED ORDER — TESTOSTERONE CYPIONATE 200 MG/ML IM SOLN
200.0000 mg | Freq: Once | INTRAMUSCULAR | Status: AC
  Administered 2011-07-17: 200 mg via INTRAMUSCULAR

## 2011-07-18 ENCOUNTER — Ambulatory Visit: Payer: 59 | Admitting: Internal Medicine

## 2011-08-14 ENCOUNTER — Telehealth: Payer: Self-pay | Admitting: *Deleted

## 2011-08-14 NOTE — Telephone Encounter (Signed)
Erik Weber is not covered by insurance and he is retaining a lot of fluid.  Pt wants to know you could change his medicine

## 2011-08-15 NOTE — Telephone Encounter (Signed)
Called pt and told pt to call insurance company and find out what is covered and call me back.  Also left some samples up front for him to p/u

## 2011-08-15 NOTE — Telephone Encounter (Signed)
Please find our which ARB/Hctz combo is preferred on his ins co.

## 2011-08-28 ENCOUNTER — Telehealth: Payer: Self-pay | Admitting: *Deleted

## 2011-08-28 MED ORDER — OLMESARTAN MEDOXOMIL-HCTZ 20-12.5 MG PO TABS: 1.0000 | ORAL_TABLET | Freq: Every day | ORAL | Status: DC

## 2011-08-28 NOTE — Telephone Encounter (Signed)
I also suggest OV within 6 wks of starting new medication.  Pt should increase intake of high K foods

## 2011-08-28 NOTE — Telephone Encounter (Signed)
Insurance covers Eprosartan, Losartan, Benicar, and Micardis.  Pt was wanting the Eprosartan cause its the cheapist.  [employer]

## 2011-08-28 NOTE — Telephone Encounter (Signed)
I suggest change to Benicar / Hctz 20/12.5 mg # 30 one po qd.  RF x 2.  Pt will need BMET within 2 wks of starting medication - 401.9

## 2011-08-28 NOTE — Telephone Encounter (Signed)
Pt aware, rx sent in electronically, pt will call back to schedule appts

## 2011-11-02 ENCOUNTER — Telehealth: Payer: Self-pay | Admitting: Internal Medicine

## 2011-11-02 NOTE — Telephone Encounter (Signed)
Pt called and is req to get copy of cpx lab results from 03/2011. Pt needs this for his work. Pls call pt.

## 2011-11-02 NOTE — Telephone Encounter (Signed)
Labs mailed, pt aware

## 2011-12-19 ENCOUNTER — Other Ambulatory Visit: Payer: Self-pay | Admitting: Internal Medicine

## 2012-02-09 ENCOUNTER — Telehealth: Payer: Self-pay | Admitting: Internal Medicine

## 2012-02-09 NOTE — Telephone Encounter (Signed)
Per Dr Artist Pais add PSA, appt scheduled

## 2012-02-09 NOTE — Telephone Encounter (Signed)
Ok for order for free and total testosterone.  Use hypogonadism code

## 2012-02-09 NOTE — Telephone Encounter (Signed)
Patient is requesting to have his testosterone level checked, please advise

## 2012-02-12 ENCOUNTER — Other Ambulatory Visit (INDEPENDENT_AMBULATORY_CARE_PROVIDER_SITE_OTHER): Payer: 59

## 2012-02-12 DIAGNOSIS — E291 Testicular hypofunction: Secondary | ICD-10-CM

## 2012-02-12 LAB — TESTOSTERONE: Testosterone: 249.4 ng/dL — ABNORMAL LOW (ref 350.00–890.00)

## 2012-02-13 ENCOUNTER — Other Ambulatory Visit: Payer: Self-pay | Admitting: Internal Medicine

## 2012-02-13 MED ORDER — TESTOSTERONE 30 MG/ACT TD SOLN: 60.0000 mg | Freq: Every day | TRANSDERMAL | Status: DC

## 2012-02-13 NOTE — Progress Notes (Signed)
Pt is sch for 03-25-2012

## 2012-02-13 NOTE — Progress Notes (Signed)
Could you call pt to schedule testosterone lab? Thanks

## 2012-02-13 NOTE — Progress Notes (Signed)
1-7 lmom for pt to callback

## 2012-02-20 ENCOUNTER — Encounter: Payer: Self-pay | Admitting: *Deleted

## 2012-02-21 ENCOUNTER — Other Ambulatory Visit: Payer: Self-pay | Admitting: Internal Medicine

## 2012-03-14 ENCOUNTER — Other Ambulatory Visit: Payer: Self-pay | Admitting: Internal Medicine

## 2012-03-25 ENCOUNTER — Other Ambulatory Visit: Payer: 59

## 2012-03-25 ENCOUNTER — Encounter: Payer: Self-pay | Admitting: Internal Medicine

## 2012-03-27 ENCOUNTER — Ambulatory Visit (INDEPENDENT_AMBULATORY_CARE_PROVIDER_SITE_OTHER): Payer: 59 | Admitting: Internal Medicine

## 2012-03-27 ENCOUNTER — Encounter: Payer: Self-pay | Admitting: Internal Medicine

## 2012-03-27 VITALS — BP 124/90 | HR 76 | Temp 98.1°F | Wt 278.0 lb

## 2012-03-27 DIAGNOSIS — E291 Testicular hypofunction: Secondary | ICD-10-CM

## 2012-03-27 DIAGNOSIS — J209 Acute bronchitis, unspecified: Secondary | ICD-10-CM

## 2012-03-27 MED ORDER — MOMETASONE FURO-FORMOTEROL FUM 200-5 MCG/ACT IN AERO: 2.0000 | INHALATION_SPRAY | Freq: Two times a day (BID) | RESPIRATORY_TRACT | Status: DC

## 2012-03-27 MED ORDER — HYDROCODONE-HOMATROPINE 5-1.5 MG/5ML PO SYRP: 5.0000 mL | ORAL_SOLUTION | Freq: Three times a day (TID) | ORAL | Status: DC | PRN

## 2012-03-27 MED ORDER — CEFPODOXIME PROXETIL 200 MG PO TABS: 200.0000 mg | ORAL_TABLET | Freq: Two times a day (BID) | ORAL | Status: DC

## 2012-03-27 MED ORDER — AZITHROMYCIN 250 MG PO TABS: ORAL_TABLET | ORAL | Status: DC

## 2012-03-27 NOTE — Assessment & Plan Note (Signed)
Patient has been on depo testosterone for one month. Arrange followup testosterone level and PSA in one month.

## 2012-03-27 NOTE — Patient Instructions (Addendum)
Please contact our office if your symptoms do not improve or gets worse. Return in one month for blood tests: Total testosterone level, PSA - 257.2

## 2012-03-27 NOTE — Assessment & Plan Note (Signed)
37 year old [place] American male with signs symptoms of acute bronchitis. Treat with Vantin 200 mg twice daily for 10 days and azithromycin 250 mg for 5 days. Use sample of Dulera metered-dose inhaler as directed.  Patient advised to call office if symptoms persist or worsen.

## 2012-03-27 NOTE — Progress Notes (Signed)
  Subjective:    Patient ID: Erik Weber, male    DOB: 12-17-1975, 37 y.o.   MRN: [id number]  HPI  37 year old African American male with history of hypertension complains of chest congestion and cough x1 week. His cough is productive of greenish sputum. He reports intermittent wheezing and mild shortness of breath. He denies any sinus pressure.  Hypogonadism-he finally received depo testosterone. He has been using for one month.  Review of Systems Negative for fever or myalgias  Past Medical History  Diagnosis Date  . Childhood asthma   . Genital warts   . Positive TB test   . Hypertension   . Chronic renal insufficiency     History   Social History  . Marital Status: Single    Spouse Name: N/A    Number of Children: N/A  . Years of Education: N/A   Occupational History  . Not on file.   Social History Main Topics  . Smoking status: Never Smoker   . Smokeless tobacco: Not on file  . Alcohol Use: Not on file  . Drug Use: Not on file  . Sexually Active: Not on file   Other Topics Concern  . 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   Never Smoked   Alcohol use-no   Caffeine use/day:  None   Does Patient Exercise:  yes          Past Surgical History  Procedure Laterality Date  . Tonsillectomy      1980's    Family History  Problem Relation Age of Onset  . Kidney cancer Mother 43  . Hypertension Father     age 54  . Other      no colon, no prostate cancer  . Lung cancer      unlce    No Known Allergies  Current Outpatient Prescriptions on File Prior to Visit  Medication Sig Dispense Refill  . BENICAR HCT 20-12.5 MG per tablet TAKE ONE TABLET BY MOUTH EVERY DAY  30 tablet  0  . Syringe/Needle, Disp, (SYRINGE 3CC/22GX1") 22G X 1" 3 ML MISC 1 each by Does not apply route every 14 (fourteen) days.  100 each  0  . testosterone  cypionate (DEPOTESTOTERONE CYPIONATE) 200 MG/ML injection Inject 1 mL (200 mg total) into the muscle every 14 (fourteen) days.  10 mL  0   No current facility-administered medications on file prior to visit.    BP 124/90  Pulse 76  Temp(Src) 98.1 F (36.7 C) (Oral)  Wt 278 lb (126.1 kg)  BMI 38.79 kg/m2  SpO2 97%       Objective:   Physical Exam  Constitutional: He appears well-developed and well-nourished.  HENT:  Head: Normocephalic and atraumatic.  Mouth/Throat: No oropharyngeal exudate.  Left tympanic membranes erythematous and retracted, oropharyngeal erythema   Neck: Neck supple.  No neck tenderness  Cardiovascular: Normal rate, regular rhythm and normal heart sounds.   Pulmonary/Chest: Effort normal.  Bilateral expiratory wheeze, worse in right lung field versus left  Skin: Skin is warm and dry.  Psychiatric: He has a normal mood and affect. His behavior is normal.          Assessment & Plan:

## 2012-04-15 ENCOUNTER — Other Ambulatory Visit: Payer: Self-pay | Admitting: Internal Medicine

## 2012-04-18 ENCOUNTER — Other Ambulatory Visit (INDEPENDENT_AMBULATORY_CARE_PROVIDER_SITE_OTHER): Payer: 59

## 2012-04-18 DIAGNOSIS — E291 Testicular hypofunction: Secondary | ICD-10-CM

## 2012-04-18 DIAGNOSIS — Z209 Contact with and (suspected) exposure to unspecified communicable disease: Secondary | ICD-10-CM

## 2012-04-18 LAB — PSA: PSA: 0.45 ng/mL (ref 0.10–4.00)

## 2012-04-19 ENCOUNTER — Other Ambulatory Visit: Payer: 59

## 2012-04-24 ENCOUNTER — Telehealth: Payer: Self-pay | Admitting: Internal Medicine

## 2012-04-24 NOTE — Telephone Encounter (Signed)
Patient Information:  Caller Name: Fritz  Phone: [phone]  Patient: Erik Weber, Erik Weber  Gender: Male  DOB: 1975-05-24  Age: 37 Years  PCP: Artist Pais Doe-Hyun Molly Maduro) (Adults only)  Office Follow Up:  Does the office need to follow up with this patient?: No  Instructions For The Office: N/A  RN Note:  Suggested Coricidin HBP OTC; Neti Pot; cool mist humidifier; push liquids.  Symptoms  Reason For Call & Symptoms: Has nasal congestion since 3/17.  No fever.  No wheezing.  Reviewed Health History In EMR: Yes  Reviewed Medications In EMR: Yes  Reviewed Allergies In EMR: Yes  Reviewed Surgeries / Procedures: Yes  Date of Onset of Symptoms: 04/22/2012  Treatments Tried: Musinex, Benadryl  Treatments Tried Worked: No  Guideline(s) Used:  Colds  Disposition Per Guideline:   Home Care  Reason For Disposition Reached:   Colds with no complications  Advice Given:  Reassurance  It sounds like an uncomplicated cold that we can treat at home.  Treatment for Associated Symptoms of Colds:  For muscle aches, headaches, or moderate fever (more than 101 F or 38.9 C): Take acetaminophen every 4 hours.  Hydrate: Drink adequate liquids.  Expected Course:   Nasal discharge 7-14 days  Caution - Nasal Decongestants:  Do not take these medications if you have high blood pressure, heart disease, prostate problems, or an overactive thyroid.  Patient Will Follow Care Advice:  YES

## 2012-05-09 ENCOUNTER — Telehealth: Payer: Self-pay | Admitting: Internal Medicine

## 2012-05-09 MED ORDER — TESTOSTERONE CYPIONATE 200 MG/ML IM SOLN: 200.0000 mg | INTRAMUSCULAR | Status: DC

## 2012-05-09 MED ORDER — AMLODIPINE BESYLATE 5 MG PO TABS: 5.0000 mg | ORAL_TABLET | Freq: Every day | ORAL | Status: DC

## 2012-05-09 MED ORDER — OLMESARTAN MEDOXOMIL-HCTZ 20-12.5 MG PO TABS: ORAL_TABLET | ORAL | Status: DC

## 2012-05-09 NOTE — Telephone Encounter (Signed)
Call his pharm to confirm he has been taking amlodipine.  If yes, ok to refill for 3 month supply.  Please update med list accordingly

## 2012-05-09 NOTE — Telephone Encounter (Signed)
It looks like amlodipine was taken off of med list.  Please advise

## 2012-05-09 NOTE — Telephone Encounter (Signed)
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 Please refill his amlodipine 5 mg 1poqd , benicar HCT 20-12.5 mg 1poqd, and testosterone cypinate 200 mg/ml injection inject 1 ml into the muscle q 14 days. Please assist.

## 2012-05-09 NOTE — Telephone Encounter (Signed)
rx sent in electronically 

## 2012-05-13 ENCOUNTER — Telehealth: Payer: Self-pay | Admitting: Internal Medicine

## 2012-05-13 MED ORDER — AMLODIPINE BESYLATE 5 MG PO TABS: 5.0000 mg | ORAL_TABLET | Freq: Every day | ORAL | Status: DC

## 2012-05-13 MED ORDER — TESTOSTERONE CYPIONATE 200 MG/ML IM SOLN: 200.0000 mg | INTRAMUSCULAR | Status: DC

## 2012-05-13 MED ORDER — OLMESARTAN MEDOXOMIL-HCTZ 20-12.5 MG PO TABS: ORAL_TABLET | ORAL | Status: DC

## 2012-05-13 NOTE — Telephone Encounter (Signed)
rxs faxed to pharmacy

## 2012-05-13 NOTE — Telephone Encounter (Signed)
Patient called stating that [facility] never received refill request for his benicar, norvasc, and testosterone. Please assist.

## 2012-05-14 ENCOUNTER — Other Ambulatory Visit: Payer: 59

## 2012-05-15 ENCOUNTER — Other Ambulatory Visit (INDEPENDENT_AMBULATORY_CARE_PROVIDER_SITE_OTHER): Payer: 59

## 2012-05-15 DIAGNOSIS — Z0000 Encounter for general adult medical examination without abnormal findings: Secondary | ICD-10-CM

## 2012-05-15 LAB — BASIC METABOLIC PANEL
CO2: 29 mEq/L (ref 19–32)
Calcium: 9.4 mg/dL (ref 8.4–10.5)
GFR: 61.33 mL/min (ref >60.00)
Sodium: 138 mEq/L (ref 135–145)

## 2012-05-15 LAB — POCT URINALYSIS DIPSTICK
Leukocytes, UA: NEGATIVE
Protein, UA: NEGATIVE
Urobilinogen, UA: 0.2

## 2012-05-15 LAB — CBC WITH DIFFERENTIAL/PLATELET
Basophils Relative: 0.7 % (ref 0.0–3.0)
Hemoglobin: 16.1 g/dL (ref 13.0–17.0)
Lymphocytes Relative: 45.5 % (ref 12.0–46.0)
Monocytes Relative: 8.4 % (ref 3.0–12.0)
Neutro Abs: 3.8 10*3/uL (ref 1.4–7.7)
RBC: 5.71 Mil/uL (ref 4.22–5.81)

## 2012-05-15 LAB — HEPATIC FUNCTION PANEL
AST: 24 U/L (ref 0–37)
Albumin: 4.4 g/dL (ref 3.5–5.2)

## 2012-05-15 LAB — LIPID PANEL
HDL: 30.6 mg/dL — ABNORMAL LOW (ref >39.00)
Total CHOL/HDL Ratio: 6
VLDL: 42.8 mg/dL — ABNORMAL HIGH (ref 0.0–40.0)

## 2012-05-15 LAB — TSH: TSH: 0.68 u[IU]/mL (ref 0.35–5.50)

## 2012-05-15 LAB — LDL CHOLESTEROL, DIRECT: Direct LDL: 111.2 mg/dL

## 2012-05-16 ENCOUNTER — Other Ambulatory Visit: Payer: Self-pay | Admitting: Internal Medicine

## 2012-05-21 ENCOUNTER — Encounter: Payer: 59 | Admitting: Internal Medicine

## 2012-05-28 ENCOUNTER — Encounter: Payer: 59 | Admitting: Internal Medicine

## 2012-05-28 ENCOUNTER — Ambulatory Visit (INDEPENDENT_AMBULATORY_CARE_PROVIDER_SITE_OTHER): Payer: 59 | Admitting: Family

## 2012-05-28 ENCOUNTER — Encounter: Payer: Self-pay | Admitting: Family

## 2012-05-28 VITALS — BP 120/88 | HR 69 | Ht 71.0 in | Wt 273.0 lb

## 2012-05-28 DIAGNOSIS — E781 Pure hyperglyceridemia: Secondary | ICD-10-CM | POA: Insufficient documentation

## 2012-05-28 DIAGNOSIS — Z0000 Encounter for general adult medical examination without abnormal findings: Secondary | ICD-10-CM

## 2012-05-28 HISTORY — DX: Pure hyperglyceridemia: E78.1

## 2012-05-28 NOTE — Progress Notes (Signed)
Subjective:    Patient ID: Erik Weber, male    DOB: [birth year], 37 y.o.   MRN: [id number]  HPI 37 year old African American male, nonsmoker, patient of Dr. Marquis Lunch. is in today for complete physical exam. He has a history of hypertension, hypogonadism, and bronchitis. He's currently stable on all medications. Denies any concerns.   All immunizations and health maintenance protocols were reviewed with the patient and they are up to date with these protocols.   screening laboratory values were reviewed with the patient including screening of hyperlipidemia PSA renal function and hepatic function.   There medications past medical history social history problem list and allergies were reviewed in detail.   Goals were established with regard to weight loss exercise diet in compliance with medications   Review of Systems  Constitutional: Negative.   HENT: Negative.   Eyes: Negative.   Respiratory: Negative.   Cardiovascular: Negative.   Gastrointestinal: Negative.   Endocrine: Negative.   Genitourinary: Negative.   Musculoskeletal: Negative.   Skin: Negative.   Allergic/Immunologic: Negative.   Neurological: Negative.   Hematological: Negative.   Psychiatric/Behavioral: Negative.    Past Medical History  Diagnosis Date  . Childhood asthma   . Genital warts   . Positive TB test   . Hypertension   . Chronic renal insufficiency     History   Social History  . Marital Status: Single    Spouse Name: N/A    Number of Children: N/A  . Years of Education: N/A   Occupational History  . Not on file.   Social History Main Topics  . Smoking status: Never Smoker   . Smokeless tobacco: Not on file  . Alcohol Use: Not on file  . Drug Use: Not on file  . Sexually Active: Not on file   Other Topics Concern  . 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   Never  Smoked   Alcohol use-no   Caffeine use/day:  None   Does Patient Exercise:  yes          Past Surgical History  Procedure Laterality Date  . Tonsillectomy      1980's    Family History  Problem Relation Age of Onset  . Kidney cancer Mother 22  . Hypertension Father     age 31  . Other      no colon, no prostate cancer  . Lung cancer      unlce    No Known Allergies  Current Outpatient Prescriptions on File Prior to Visit  Medication Sig Dispense Refill  . amLODipine (NORVASC) 5 MG tablet Take 1 tablet (5 mg total) by mouth daily.  90 tablet  0  . BENICAR HCT 20-12.5 MG per tablet TAKE ONE TABLET BY MOUTH ONCE DAILY  30 tablet  0  . cefpodoxime (VANTIN) 200 MG tablet Take 1 tablet (200 mg total) by mouth 2 (two) times daily.  20 tablet  0  . HYDROcodone-homatropine (HYCODAN) 5-1.5 MG/5ML syrup Take 5 mLs by mouth every 8 (eight) hours as needed for cough.  120 mL  0  . mometasone-formoterol (DULERA) 200-5 MCG/ACT AERO Inhale 2 puffs into the lungs 2 (two) times daily.  8.8 g  0  . olmesartan-hydrochlorothiazide (BENICAR HCT) 20-12.5 MG per tablet TAKE ONE TABLET BY MOUTH EVERY DAY  90 tablet  0  . Syringe/Needle, Disp, (SYRINGE 3CC/22GX1") 22G X 1" 3 ML MISC 1 each by Does not apply route every 14 (fourteen) days.  100 each  0  . testosterone cypionate (DEPOTESTOTERONE CYPIONATE) 200 MG/ML injection Inject 1 mL (200 mg total) into the muscle every 14 (fourteen) days.  10 mL  0  . azithromycin (ZITHROMAX) 250 MG tablet 2 tabs on day one, then one tab daily for 4 days  6 tablet  0   No current facility-administered medications on file prior to visit.    BP 120/88  Pulse 69  Ht 5\' 11"  (1.803 m)  Wt 273 lb (123.832 kg)  BMI 38.09 kg/m2  SpO2 98%chart    Objective:   Physical Exam  Constitutional: He appears well-developed and well-nourished.  HENT:  Head: Normocephalic and atraumatic.  Right Ear: External ear normal.  Left Ear: External ear normal.  Nose: Nose  normal.  Mouth/Throat: Oropharynx is clear and moist.  Eyes: Conjunctivae and EOM are normal. Pupils are equal, round, and reactive to light.  Neck: Normal range of motion. Neck supple.  Cardiovascular: Normal rate, regular rhythm and normal heart sounds.   Pulmonary/Chest: Effort normal and breath sounds normal.  Abdominal: Soft. Bowel sounds are normal.  Genitourinary: Penis normal. No penile tenderness.  Musculoskeletal: Normal range of motion.  Neurological: He is alert. He has normal reflexes.  Skin: Skin is warm and dry.  Psychiatric: He has a normal mood and affect.          Assessment & Plan:  Assessment:  1. CPX 2. Hypertension 3. Hypercholesterolemia  Plan: Encouraged a healthy diet, exercise, testicular exams. Advised low triglyceride diet. Recheck cholesterol in 3-4 months with Dr. Artist Pais and sooner as needed.

## 2012-05-28 NOTE — Patient Instructions (Signed)
Hypertriglyceridemia  Diet for High blood levels of Triglycerides Most fats in food are triglycerides. Triglycerides in your blood are stored as fat in your body. High levels of triglycerides in your blood may put you at a greater risk for heart disease and stroke.  Normal triglyceride levels are less than 150 mg/dL. Borderline high levels are 150-199 mg/dl. High levels are 200 - 499 mg/dL, and very high triglyceride levels are greater than 500 mg/dL. The decision to treat high triglycerides is generally based on the level. For people with borderline or high triglyceride levels, treatment includes weight loss and exercise. Drugs are recommended for people with very high triglyceride levels. Many people who need treatment for high triglyceride levels have metabolic syndrome. This syndrome is a collection of disorders that often include: insulin resistance, high blood pressure, blood clotting problems, high cholesterol and triglycerides. TESTING PROCEDURE FOR TRIGLYCERIDES  You should not eat 4 hours before getting your triglycerides measured. The normal range of triglycerides is between 10 and 250 milligrams per deciliter (mg/dl). Some people may have extreme levels (1000 or above), but your triglyceride level may be too high if it is above 150 mg/dl, depending on what other risk factors you have for heart disease.  People with high blood triglycerides may also have high blood cholesterol levels. If you have high blood cholesterol as well as high blood triglycerides, your risk for heart disease is probably greater than if you only had high triglycerides. High blood cholesterol is one of the main risk factors for heart disease. CHANGING YOUR DIET  Your weight can affect your blood triglyceride level. If you are more than 20% above your ideal body weight, you may be able to lower your blood triglycerides by losing weight. Eating less and exercising regularly is the best way to combat this. Fat provides more  calories than any other food. The best way to lose weight is to eat less fat. Only 30% of your total calories should come from fat. Less than 7% of your diet should come from saturated fat. A diet low in fat and saturated fat is the same as a diet to decrease blood cholesterol. By eating a diet lower in fat, you may lose weight, lower your blood cholesterol, and lower your blood triglyceride level.  Eating a diet low in fat, especially saturated fat, may also help you lower your blood triglyceride level. Ask your dietitian to help you figure how much fat you can eat based on the number of calories your caregiver has prescribed for you.  Exercise, in addition to helping with weight loss may also help lower triglyceride levels.   Alcohol can increase blood triglycerides. You may need to stop drinking alcoholic beverages.  Too much carbohydrate in your diet may also increase your blood triglycerides. Some complex carbohydrates are necessary in your diet. These may include bread, rice, potatoes, other starchy vegetables and cereals.  Reduce "simple" carbohydrates. These may include pure sugars, candy, honey, and jelly without losing other nutrients. If you have the kind of high blood triglycerides that is affected by the amount of carbohydrates in your diet, you will need to eat less sugar and less high-sugar foods. Your caregiver can help you with this.  Adding 2-4 grams of fish oil (EPA+ DHA) may also help lower triglycerides. Speak with your caregiver before adding any supplements to your regimen. Following the Diet  Maintain your ideal weight. Your caregivers can help you with a diet. Generally, eating less food and getting more   exercise will help you lose weight. Joining a weight control group may also help. Ask your caregivers for a good weight control group in your area.  Eat low-fat foods instead of high-fat foods. This can help you lose weight too.  These foods are lower in fat. Eat MORE of these:    Dried beans, peas, and lentils.  Egg whites.  Low-fat cottage cheese.  Fish.  Lean cuts of meat, such as round, sirloin, rump, and flank (cut extra fat off meat you fix).  Whole grain breads, cereals and pasta.  Skim and nonfat dry milk.  Low-fat yogurt.  Poultry without the skin.  Cheese made with skim or part-skim milk, such as mozzarella, parmesan, farmers', ricotta, or pot cheese. These are higher fat foods. Eat LESS of these:   Whole milk and foods made from whole milk, such as American, blue, cheddar, monterey jack, and swiss cheese  High-fat meats, such as luncheon meats, sausages, knockwurst, bratwurst, hot dogs, ribs, corned beef, ground pork, and regular ground beef.  Fried foods. Limit saturated fats in your diet. Substituting unsaturated fat for saturated fat may decrease your blood triglyceride level. You will need to read package labels to know which products contain saturated fats.  These foods are high in saturated fat. Eat LESS of these:   Fried pork skins.  Whole milk.  Skin and fat from poultry.  Palm oil.  Butter.  Shortening.  Cream cheese.  Bacon.  Margarines and baked goods made from listed oils.  Vegetable shortenings.  Chitterlings.  Fat from meats.  Coconut oil.  Palm kernel oil.  Lard.  Cream.  Sour cream.  Fatback.  Coffee whiteners and non-dairy creamers made with these oils.  Cheese made from whole milk. Use unsaturated fats (both polyunsaturated and monounsaturated) moderately. Remember, even though unsaturated fats are better than saturated fats; you still want a diet low in total fat.  These foods are high in unsaturated fat:   Canola oil.  Sunflower oil.  Mayonnaise.  Almonds.  Peanuts.  Pine nuts.  Margarines made with these oils.  Safflower oil.  Olive oil.  Avocados.  Cashews.  Peanut butter.  Sunflower seeds.  Soybean oil.  Peanut  oil.  Olives.  Pecans.  Walnuts.  Pumpkin seeds. Avoid sugar and other high-sugar foods. This will decrease carbohydrates without decreasing other nutrients. Sugar in your food goes rapidly to your blood. When there is excess sugar in your blood, your liver may use it to make more triglycerides. Sugar also contains calories without other important nutrients.  Eat LESS of these:   Sugar, brown sugar, powdered sugar, jam, jelly, preserves, honey, syrup, molasses, pies, candy, cakes, cookies, frosting, pastries, colas, soft drinks, punches, fruit drinks, and regular gelatin.  Avoid alcohol. Alcohol, even more than sugar, may increase blood triglycerides. In addition, alcohol is high in calories and low in nutrients. Ask for sparkling water, or a diet soft drink instead of an alcoholic beverage. Suggestions for planning and preparing meals   Bake, broil, grill or roast meats instead of frying.  Remove fat from meats and skin from poultry before cooking.  Add spices, herbs, lemon juice or vinegar to vegetables instead of salt, rich sauces or gravies.  Use a non-stick skillet without fat or use no-stick sprays.  Cool and refrigerate stews and broth. Then remove the hardened fat floating on the surface before serving.  Refrigerate meat drippings and skim off fat to make low-fat gravies.  Serve more fish.  Use less butter,   margarine and other high-fat spreads on bread or vegetables.  Use skim or reconstituted non-fat dry milk for cooking.  Cook with low-fat cheeses.  Substitute low-fat yogurt or cottage cheese for all or part of the sour cream in recipes for sauces, dips or congealed salads.  Use half yogurt/half mayonnaise in salad recipes.  Substitute evaporated skim milk for cream. Evaporated skim milk or reconstituted non-fat dry milk can be whipped and substituted for whipped cream in certain recipes.  Choose fresh fruits for dessert instead of high-fat foods such as pies or  cakes. Fruits are naturally low in fat. When Dining Out   Order low-fat appetizers such as fruit or vegetable juice, pasta with vegetables or tomato sauce.  Select clear, rather than cream soups.  Ask that dressings and gravies be served on the side. Then use less of them.  Order foods that are baked, broiled, poached, steamed, stir-fried, or roasted.  Ask for margarine instead of butter, and use only a small amount.  Drink sparkling water, unsweetened tea or coffee, or diet soft drinks instead of alcohol or other sweet beverages. QUESTIONS AND ANSWERS ABOUT OTHER FATS IN THE BLOOD: SATURATED FAT, TRANS FAT, AND CHOLESTEROL What is trans fat? Trans fat is a type of fat that is formed when vegetable oil is hardened through a process called hydrogenation. This process helps makes foods more solid, gives them shape, and prolongs their shelf life. Trans fats are also called hydrogenated or partially hydrogenated oils.  What do saturated fat, trans fat, and cholesterol in foods have to do with heart disease? Saturated fat, trans fat, and cholesterol in the diet all raise the level of LDL "bad" cholesterol in the blood. The higher the LDL cholesterol, the greater the risk for coronary heart disease (CHD). Saturated fat and trans fat raise LDL similarly.  What foods contain saturated fat, trans fat, and cholesterol? High amounts of saturated fat are found in animal products, such as fatty cuts of meat, chicken skin, and full-fat dairy products like butter, whole milk, cream, and cheese, and in tropical vegetable oils such as palm, palm kernel, and coconut oil. Trans fat is found in some of the same foods as saturated fat, such as vegetable shortening, some margarines (especially hard or stick margarine), crackers, cookies, baked goods, fried foods, salad dressings, and other processed foods made with partially hydrogenated vegetable oils. Small amounts of trans fat also occur naturally in some animal  products, such as milk products, beef, and lamb. Foods high in cholesterol include liver, other organ meats, egg yolks, shrimp, and full-fat dairy products. How can I use the new food label to make heart-healthy food choices? Check the Nutrition Facts panel of the food label. Choose foods lower in saturated fat, trans fat, and cholesterol. For saturated fat and cholesterol, you can also use the Percent Daily Value (%DV): 5% DV or less is low, and 20% DV or more is high. (There is no %DV for trans fat.) Use the Nutrition Facts panel to choose foods low in saturated fat and cholesterol, and if the trans fat is not listed, read the ingredients and limit products that list shortening or hydrogenated or partially hydrogenated vegetable oil, which tend to be high in trans fat. POINTS TO REMEMBER:   Discuss your risk for heart disease with your caregivers, and take steps to reduce risk factors.  Change your diet. Choose foods that are low in saturated fat, trans fat, and cholesterol.  Add exercise to your daily routine if   it is not already being done. Participate in physical activity of moderate intensity, like brisk walking, for at least 30 minutes on most, and preferably all days of the week. No time? Break the 30 minutes into three, 10-minute segments during the day.  Stop smoking. If you do smoke, contact your caregiver to discuss ways in which they can help you quit.  Do not use street drugs.  Maintain a normal weight.  Maintain a healthy blood pressure.  Keep up with your blood work for checking the fats in your blood as directed by your caregiver. Document Released: 11/11/2003 Document Revised: 07/25/2011 Document Reviewed: 06/08/2008 [employer] Patient Information 2013 [employer].  

## 2012-08-14 ENCOUNTER — Encounter: Payer: 59 | Admitting: Family

## 2012-09-18 ENCOUNTER — Other Ambulatory Visit: Payer: Self-pay | Admitting: *Deleted

## 2012-09-18 MED ORDER — OLMESARTAN MEDOXOMIL-HCTZ 20-12.5 MG PO TABS: ORAL_TABLET | ORAL | Status: DC

## 2012-09-21 ENCOUNTER — Encounter: Payer: Self-pay | Admitting: Family Medicine

## 2012-09-21 ENCOUNTER — Ambulatory Visit (INDEPENDENT_AMBULATORY_CARE_PROVIDER_SITE_OTHER): Payer: BC Managed Care – PPO | Admitting: Family Medicine

## 2012-09-21 VITALS — BP 130/80 | HR 88 | Temp 98.2°F | Resp 20 | Wt 278.8 lb

## 2012-09-21 DIAGNOSIS — J0100 Acute maxillary sinusitis, unspecified: Secondary | ICD-10-CM | POA: Insufficient documentation

## 2012-09-21 MED ORDER — PROMETHAZINE-DM 6.25-15 MG/5ML PO SYRP: 5.0000 mL | ORAL_SOLUTION | Freq: Four times a day (QID) | ORAL | Status: DC | PRN

## 2012-09-21 MED ORDER — AMOXICILLIN 875 MG PO TABS: 875.0000 mg | ORAL_TABLET | Freq: Two times a day (BID) | ORAL | Status: DC

## 2012-09-21 NOTE — Progress Notes (Signed)
  Subjective:    Patient ID: Erik Weber, male    DOB: 10/12/1975, 37 y.o.   MRN: [id number]  HPI URI- sxs started 2-3 days ago w/ 'sinus congestion and now it moved into my chest'.  + strep and bronchitis exposure.  No sore throat.  No fevers.  + facial pain/pressure.  No ear pain.  Cough is productive.  Using Nasacort.  No N/V/D.   Review of Systems For ROS see HPI     Objective:   Physical Exam  Constitutional: He appears well-developed and well-nourished. No distress.  HENT:  Head: Normocephalic and atraumatic.  Right Ear: Tympanic membrane normal.  Left Ear: Tympanic membrane normal.  Nose: Mucosal edema and rhinorrhea present. Right sinus exhibits maxillary sinus tenderness and frontal sinus tenderness. Left sinus exhibits maxillary sinus tenderness and frontal sinus tenderness.  Mouth/Throat: Mucous membranes are normal. No oropharyngeal exudate, posterior oropharyngeal edema or posterior oropharyngeal erythema.  + PND  Eyes: Conjunctivae and EOM are normal. Pupils are equal, round, and reactive to light.  Neck: Normal range of motion. Neck supple.  Cardiovascular: Normal rate, regular rhythm and normal heart sounds.   Pulmonary/Chest: Effort normal and breath sounds normal. No respiratory distress. He has no wheezes.  + hacking cough  Lymphadenopathy:    He has no cervical adenopathy.  Skin: Skin is warm and dry.          Assessment & Plan:

## 2012-09-21 NOTE — Patient Instructions (Addendum)
This is a sinus infection Start the Amox twice daily- take w/ food Drink plenty of fluids REST! Use the cough syrup as needed- will cause drowsiness Use Mucinex DM for daytime cough Call with any questions or concerns Hang in there!!!

## 2012-09-21 NOTE — Assessment & Plan Note (Signed)
Pt's sxs and PE consistent w/ infxn.  Start abx.  Cough meds prn.  Reviewed supportive care and red flags that should prompt return.  Pt expressed understanding and is in agreement w/ plan.  

## 2012-09-26 ENCOUNTER — Telehealth: Payer: Self-pay | Admitting: Internal Medicine

## 2012-09-26 NOTE — Telephone Encounter (Signed)
Because patient was stable on the Benicar HCT before he went on his new insurance, they have approved it for the life of the policy. Patient is aware.

## 2012-09-26 NOTE — Telephone Encounter (Signed)
Dr Artist Pais, I am trying to get patient's Benicar approved through insurance. Can you tell me why he is not a candidate for an ACE? I did not see he's ever tried one, and it's a criteria I have to fill with [employer] [place]. Thanks!

## 2012-09-26 NOTE — Telephone Encounter (Signed)
If I recall correctly, he was started on ARB by his nephrologist.  I am not sure if his nephrologist ever tried ACE.  You can call pt and ask him.  If he doesn't know, then consider calling nephrologist

## 2012-10-22 ENCOUNTER — Encounter: Payer: Self-pay | Admitting: Family

## 2012-10-22 ENCOUNTER — Ambulatory Visit (HOSPITAL_BASED_OUTPATIENT_CLINIC_OR_DEPARTMENT_OTHER)
Admission: RE | Admit: 2012-10-22 | Discharge: 2012-10-22 | Disposition: A | Discharge status: Home / Self Care | Payer: BC Managed Care – PPO | Source: Ambulatory Visit | Attending: Family | Admitting: Family

## 2012-10-22 ENCOUNTER — Telehealth: Payer: Self-pay | Admitting: Family

## 2012-10-22 ENCOUNTER — Ambulatory Visit (INDEPENDENT_AMBULATORY_CARE_PROVIDER_SITE_OTHER): Payer: BC Managed Care – PPO | Admitting: Family

## 2012-10-22 VITALS — BP 130/98 | HR 80 | Temp 98.0°F | Resp 18 | Wt 278.0 lb

## 2012-10-22 DIAGNOSIS — R109 Unspecified abdominal pain: Secondary | ICD-10-CM | POA: Insufficient documentation

## 2012-10-22 DIAGNOSIS — N259 Disorder resulting from impaired renal tubular function, unspecified: Secondary | ICD-10-CM

## 2012-10-22 DIAGNOSIS — N289 Disorder of kidney and ureter, unspecified: Secondary | ICD-10-CM

## 2012-10-22 DIAGNOSIS — R1032 Left lower quadrant pain: Secondary | ICD-10-CM | POA: Insufficient documentation

## 2012-10-22 DIAGNOSIS — R300 Dysuria: Secondary | ICD-10-CM

## 2012-10-22 DIAGNOSIS — N390 Urinary tract infection, site not specified: Secondary | ICD-10-CM

## 2012-10-22 LAB — BASIC METABOLIC PANEL
CO2: 29 mEq/L (ref 19–32)
Calcium: 9.8 mg/dL (ref 8.4–10.5)
Creat: 1.53 mg/dL — ABNORMAL HIGH (ref 0.50–1.35)

## 2012-10-22 LAB — POCT URINALYSIS DIPSTICK
Bilirubin, UA: NEGATIVE
Glucose, UA: NEGATIVE
Nitrite, UA: NEGATIVE

## 2012-10-22 MED ORDER — CEFTRIAXONE SODIUM 250 MG IJ SOLR
250.0000 mg | Freq: Once | INTRAMUSCULAR | Status: AC
  Administered 2012-10-22: 250 mg via INTRAMUSCULAR

## 2012-10-22 MED ORDER — AZITHROMYCIN 250 MG PO TABS
1000.0000 mg | ORAL_TABLET | Freq: Once | ORAL | Status: AC
  Administered 2012-10-22: 1000 mg via ORAL

## 2012-10-22 NOTE — Telephone Encounter (Signed)
Called radiology to follow up on ct results. Spoke to radiologist on call. He reports that there is no record of ct being completed. Attempted to reach pt at number listed. No answer.

## 2012-10-22 NOTE — Assessment & Plan Note (Signed)
Presents for dysuria and abdominal pain.  Repeat BMET to assess kidney function.

## 2012-10-22 NOTE — Patient Instructions (Addendum)
Please call if symptoms worsen or if not improved in 2-3 days. Complete your lab work prior to leaving. Complete CT scan on the first floor.

## 2012-10-22 NOTE — Assessment & Plan Note (Signed)
Costovertebral angle tenderness to left side. Urinalysis positive for leukocytes and blood.  Testing urine for GC/Chlamydia, treating today in office with Rocephin 250mg  IM and Azithromycin 1000mg  PO to cover urinary tract infection and possible GC/Chlamydia.

## 2012-10-22 NOTE — Progress Notes (Signed)
Subjective:    Patient ID: Erik Weber, male    DOB: [birth year], 37 y.o.   MRN: [id number]  HPI Mr. Swaim is a 37 year old male who presents today with a chief complaint of painful urination.  Dysuria) Patient reports dysuria has been present for 1 week each time he urinates; denies hematuria and penile discharge; reports no new sexual partners.  Patient reports taking Azo OTC which has not helped with his symptoms.  Abdominal Pain) Patient reporting bilateral lower quadrant abdominal pain x 1 week; denies constipation, last bowel movement this AM; denies bloody stools.  Reports nausea, denies vomiting.   Review of Systems  Gastrointestinal: Positive for nausea and abdominal pain. Negative for vomiting, constipation, blood in stool and abdominal distention.       Tender upon palpation to left upper and lower quadrants.  Genitourinary: Positive for dysuria, urgency, frequency and flank pain. Negative for hematuria, decreased urine volume, discharge, difficulty urinating and penile pain.       Tender to left side upon costovertebral assessment.   Past Medical History  Diagnosis Date  . Childhood asthma   . Genital warts   . Positive TB test   . Hypertension   . Chronic renal insufficiency     History   Social History  . Marital Status: Single    Spouse Name: N/A    Number of Children: N/A  . Years of Education: N/A   Occupational History  . Not on file.   Social History Main Topics  . Smoking status: Never Smoker   . Smokeless tobacco: Not on file  . Alcohol Use: Not on file  . Drug Use: Not on file  . Sexual Activity: Not on file   Other Topics Concern  . Not on file   Social History Narrative   Occupation: Clinical biochemist for [employer]    Adopted daughter - 2    Moved from [place] 2 yrs ago.   Never Smoked   Alcohol use-no   Caffeine use/day:  None   Does Patient Exercise:  yes          Past Surgical History  Procedure Laterality  Date  . Tonsillectomy      1980's    Family History  Problem Relation Age of Onset  . Kidney cancer Mother 33  . Hypertension Father     age 36  . Other      no colon, no prostate cancer  . Lung cancer      unlce    No Known Allergies  Current Outpatient Prescriptions on File Prior to Visit  Medication Sig Dispense Refill  . amLODipine (NORVASC) 5 MG tablet Take 1 tablet (5 mg total) by mouth daily.  90 tablet  0  . olmesartan-hydrochlorothiazide (BENICAR HCT) 20-12.5 MG per tablet TAKE ONE TABLET BY MOUTH EVERY DAY  90 tablet  0  . Syringe/Needle, Disp, (SYRINGE 3CC/22GX1") 22G X 1" 3 ML MISC 1 each by Does not apply route every 14 (fourteen) days.  100 each  0  . testosterone cypionate (DEPOTESTOTERONE CYPIONATE) 200 MG/ML injection Inject 1 mL (200 mg total) into the muscle every 14 (fourteen) days.  10 mL  0   No current facility-administered medications on file prior to visit.    BP 130/98  Pulse 80  Temp(Src) 98 F (36.7 C) (Oral)  Resp 18  Wt 278 lb (126.1 kg)  BMI 38.79 kg/m2  SpO2 96%  Objective:   Physical Exam  Constitutional: He is oriented to person, place, and time.  Cardiovascular: Normal rate and regular rhythm.   No murmur heard. Pulmonary/Chest: Breath sounds normal.  Abdominal: Soft. Bowel sounds are normal. He exhibits no distension. There is tenderness. There is no guarding.  Tenderness present to left side upon CVA assessment.  Neurological: He is alert and oriented to person, place, and time.  Skin: Skin is warm and dry.  Psychiatric: He has a normal mood and affect.          Assessment & Plan:

## 2012-10-22 NOTE — Assessment & Plan Note (Signed)
Tender to left upper and left lower quadrants; CVA tenderness to left side.    Ordered CT scan of abdomen with oral contrast.

## 2012-10-23 LAB — GC/CHLAMYDIA PROBE AMP, URINE: GC Probe Amp, Urine: NEGATIVE

## 2012-10-23 MED ORDER — CIPROFLOXACIN HCL 500 MG PO TABS: 500.0000 mg | ORAL_TABLET | Freq: Two times a day (BID) | ORAL | Status: DC

## 2012-10-23 NOTE — Progress Notes (Deleted)
  Subjective:    Patient ID: Erik Weber, male    DOB: September 29, 1975, 37 y.o.   MRN: [id number]  HPI    Review of Systems     Objective:   Physical Exam        Assessment & Plan:

## 2012-10-23 NOTE — Telephone Encounter (Signed)
Spoke with pt.  He reports abdominal pain is somewhat better but still has dysuria.  He reports that he did complete the CT abdomen and pelvis.  Result still unavailable in epic.  Spoke with Eber Jones in imaging and she reports that the PACs system was down yesterday evening and she will look into this and let me know.

## 2012-10-23 NOTE — Telephone Encounter (Signed)
Advised pt of CT results and let him know that I sent rx for cipro to his pharmacy to start for possible uti. He verbalizes understanding.

## 2012-10-24 LAB — URINE CULTURE: Colony Count: >=100000

## 2012-11-21 ENCOUNTER — Telehealth: Payer: Self-pay | Admitting: Internal Medicine

## 2012-11-21 NOTE — Telephone Encounter (Signed)
Pt is calling to request HIV lab orders be placed. Please assist.

## 2012-11-22 NOTE — Telephone Encounter (Signed)
Please obtain more information.  Has he recently has "high risk sexual encounter" (sex with someone with known HIV positive status),  Is he having any genital symptoms, flu like symptoms - etc.

## 2012-11-22 NOTE — Telephone Encounter (Signed)
pts partner has not been faithful and has been with "multiple" people.  He just found out last week and wants to get tested

## 2012-11-25 NOTE — Telephone Encounter (Signed)
Patient needs to come in for OV so we can determine which testing is needed

## 2012-12-02 ENCOUNTER — Other Ambulatory Visit: Payer: Self-pay | Admitting: Internal Medicine

## 2013-01-06 ENCOUNTER — Other Ambulatory Visit: Payer: Self-pay | Admitting: Internal Medicine

## 2013-04-08 ENCOUNTER — Telehealth: Payer: Self-pay | Admitting: Internal Medicine

## 2013-04-08 DIAGNOSIS — Z0000 Encounter for general adult medical examination without abnormal findings: Secondary | ICD-10-CM

## 2013-04-08 DIAGNOSIS — Z206 Contact with and (suspected) exposure to human immunodeficiency virus [HIV]: Secondary | ICD-10-CM

## 2013-04-08 DIAGNOSIS — I10 Essential (primary) hypertension: Secondary | ICD-10-CM

## 2013-04-08 NOTE — Telephone Encounter (Signed)
Pt would like to go to [facility] for his [facility] labs, can you put the order in?

## 2013-04-09 NOTE — Telephone Encounter (Signed)
What labs do you want drawn?

## 2013-04-10 NOTE — Telephone Encounter (Signed)
[facility] labs, hiv v70.0 and 401.9

## 2013-04-10 NOTE — Telephone Encounter (Signed)
Future order placed 

## 2013-04-18 ENCOUNTER — Other Ambulatory Visit: Payer: Self-pay | Admitting: Internal Medicine

## 2013-04-24 ENCOUNTER — Other Ambulatory Visit (INDEPENDENT_AMBULATORY_CARE_PROVIDER_SITE_OTHER): Payer: BC Managed Care – PPO

## 2013-04-24 DIAGNOSIS — I10 Essential (primary) hypertension: Secondary | ICD-10-CM

## 2013-04-24 DIAGNOSIS — Z0000 Encounter for general adult medical examination without abnormal findings: Secondary | ICD-10-CM

## 2013-04-24 DIAGNOSIS — Z206 Contact with and (suspected) exposure to human immunodeficiency virus [HIV]: Secondary | ICD-10-CM

## 2013-04-24 LAB — CBC WITH DIFFERENTIAL/PLATELET
BASOS ABS: 0.1 10*3/uL (ref 0.0–0.1)
Basophils Relative: 0.6 % (ref 0.0–3.0)
EOS PCT: 2 % (ref 0.0–5.0)
Eosinophils Absolute: 0.2 10*3/uL (ref 0.0–0.7)
HCT: 46.6 % (ref 39.0–52.0)
Hemoglobin: 15.6 g/dL (ref 13.0–17.0)
Lymphocytes Relative: 42.5 % (ref 12.0–46.0)
Lymphs Abs: 3.7 10*3/uL (ref 0.7–4.0)
MCHC: 33.4 g/dL (ref 30.0–36.0)
MCV: 85.5 fl (ref 78.0–100.0)
MONO ABS: 0.6 10*3/uL (ref 0.1–1.0)
MONOS PCT: 6.9 % (ref 3.0–12.0)
Neutro Abs: 4.1 10*3/uL (ref 1.4–7.7)
Neutrophils Relative %: 48 % (ref 43.0–77.0)
PLATELETS: 270 10*3/uL (ref 150.0–400.0)
RBC: 5.45 Mil/uL (ref 4.22–5.81)
RDW: 13.2 % (ref 11.5–14.6)
WBC: 8.6 10*3/uL (ref 4.5–10.5)

## 2013-04-24 LAB — URINALYSIS
BILIRUBIN URINE: NEGATIVE
HGB URINE DIPSTICK: NEGATIVE
KETONES UR: NEGATIVE
LEUKOCYTES UA: NEGATIVE
NITRITE: NEGATIVE
Specific Gravity, Urine: 1.015 (ref 1.000–1.030)
Total Protein, Urine: NEGATIVE
UROBILINOGEN UA: 0.2 (ref 0.0–1.0)
Urine Glucose: NEGATIVE
pH: 6 (ref 5.0–8.0)

## 2013-04-24 LAB — LIPID PANEL
CHOLESTEROL: 178 mg/dL (ref 0–200)
HDL: 38.4 mg/dL — ABNORMAL LOW (ref >39.00)
LDL CALC: 108 mg/dL — AB (ref 0–99)
Total CHOL/HDL Ratio: 5
Triglycerides: 158 mg/dL — ABNORMAL HIGH (ref 0.0–149.0)
VLDL: 31.6 mg/dL (ref 0.0–40.0)

## 2013-04-24 LAB — BASIC METABOLIC PANEL
BUN: 15 mg/dL (ref 6–23)
CALCIUM: 9.1 mg/dL (ref 8.4–10.5)
CO2: 26 mEq/L (ref 19–32)
CREATININE: 1.5 mg/dL (ref 0.4–1.5)
Chloride: 104 mEq/L (ref 96–112)
GFR: 65.65 mL/min (ref >60.00)
Glucose, Bld: 89 mg/dL (ref 70–99)
Potassium: 4 mEq/L (ref 3.5–5.1)
Sodium: 139 mEq/L (ref 135–145)

## 2013-04-24 LAB — HEPATIC FUNCTION PANEL
ALK PHOS: 63 U/L (ref 39–117)
ALT: 42 U/L (ref 0–53)
AST: 32 U/L (ref 0–37)
Albumin: 4.6 g/dL (ref 3.5–5.2)
Bilirubin, Direct: 0 mg/dL (ref 0.0–0.3)
TOTAL PROTEIN: 7.9 g/dL (ref 6.0–8.3)
Total Bilirubin: 0.9 mg/dL (ref 0.3–1.2)

## 2013-04-24 LAB — TSH: TSH: 0.93 u[IU]/mL (ref 0.35–5.50)

## 2013-04-24 LAB — HIV ANTIBODY (ROUTINE TESTING W REFLEX): HIV: NONREACTIVE

## 2013-05-05 ENCOUNTER — Telehealth: Payer: Self-pay | Admitting: Internal Medicine

## 2013-05-05 NOTE — Telephone Encounter (Signed)
Pt returned your call for you lab results.  He said you can leave a detailed message on his vm.

## 2013-05-06 NOTE — Telephone Encounter (Signed)
See result note, pt aware

## 2013-05-19 ENCOUNTER — Encounter: Payer: Self-pay | Admitting: Internal Medicine

## 2013-05-19 ENCOUNTER — Ambulatory Visit (INDEPENDENT_AMBULATORY_CARE_PROVIDER_SITE_OTHER): Payer: BC Managed Care – PPO | Admitting: Internal Medicine

## 2013-05-19 VITALS — BP 122/84 | HR 60 | Temp 98.7°F | Ht 72.0 in | Wt 268.0 lb

## 2013-05-19 DIAGNOSIS — B360 Pityriasis versicolor: Secondary | ICD-10-CM

## 2013-05-19 DIAGNOSIS — Z0000 Encounter for general adult medical examination without abnormal findings: Secondary | ICD-10-CM

## 2013-05-19 DIAGNOSIS — E291 Testicular hypofunction: Secondary | ICD-10-CM

## 2013-05-19 DIAGNOSIS — I10 Essential (primary) hypertension: Secondary | ICD-10-CM

## 2013-05-19 DIAGNOSIS — N259 Disorder resulting from impaired renal tubular function, unspecified: Secondary | ICD-10-CM

## 2013-05-19 HISTORY — DX: Pityriasis versicolor: B36.0

## 2013-05-19 MED ORDER — AMLODIPINE BESYLATE 5 MG PO TABS: 5.0000 mg | ORAL_TABLET | Freq: Every day | ORAL | Status: DC

## 2013-05-19 MED ORDER — TADALAFIL 20 MG PO TABS: ORAL_TABLET | ORAL | Status: DC

## 2013-05-19 MED ORDER — FLUCONAZOLE 100 MG PO TABS: 100.0000 mg | ORAL_TABLET | Freq: Every day | ORAL | Status: DC

## 2013-05-19 MED ORDER — OLMESARTAN MEDOXOMIL-HCTZ 20-12.5 MG PO TABS: 1.0000 | ORAL_TABLET | Freq: Every day | ORAL | Status: DC

## 2013-05-19 NOTE — Progress Notes (Signed)
Subjective:    Patient ID: Erik Weber, male    DOB: 06-01-1975, 38 y.o.   MRN: [id number]  HPI  38 year old African American male with history of hypertension and chronic renal insufficiency for routine physical. Patient denies significant interval medical history.  Patient has history of hypogonadism with low libido and sexual dysfunction. Patient took depo testosterone for several months but he discontinued.    Hypertension - stable.  Chronic renal insufficiency - Cr is stable.  No family hx of chronic liver disease.  No high risk sexual contacts.    Social and family hx reviewed and updated.  Review of Systems  Constitutional: Negative for activity change, appetite change. Mild weight loss Eyes: Negative for visual disturbance.  Respiratory: Negative for cough, chest tightness and shortness of breath.   Cardiovascular: Negative for chest pain.  Genitourinary: Negative for difficulty urinating. No genital rash or ulcers Neurological: Negative for headaches.  Gastrointestinal: Negative for abdominal pain, heartburn melena or hematochezia Psych: Negative for depression or anxiety Skin:  Seen by dermatologist for tinea versicolor. Endo:  Low libido    Past Medical History  Diagnosis Date  . Childhood asthma   . Genital warts   . Positive TB test   . Hypertension   . Chronic renal insufficiency     History   Social History  . Marital Status: Single    Spouse Name: N/A    Number of Children: N/A  . Years of Education: N/A   Occupational History  . Not on file.   Social History Main Topics  . Smoking status: Never Smoker   . Smokeless tobacco: Not on file  . Alcohol Use: Not on file  . Drug Use: Not on file  . Sexual Activity: Not on file   Other Topics Concern  . Not on file   Social History Narrative   Occupation: Works for lab   Single    Adopted daughter - 4   Moved from [place] 4 yrs ago.   Never Smoked   Alcohol use-no   Caffeine use/day:   None   Does Patient Exercise:  yes             Past Surgical History  Procedure Laterality Date  . Tonsillectomy      1980's    Family History  Problem Relation Age of Onset  . Kidney cancer Mother 36  . Hypertension Father     age 71  . Other      no colon, no prostate cancer  . Lung cancer      unlce    No Known Allergies  No current outpatient prescriptions on file prior to visit.   No current facility-administered medications on file prior to visit.    BP 122/84  Pulse 60  Temp(Src) 98.7 F (37.1 C) (Oral)  Ht 6' (1.829 m)  Wt 268 lb (121.564 kg)  BMI 36.34 kg/m2     Objective:   Physical Exam  Constitutional: He is oriented to person, place, and time. He appears well-developed and well-nourished. No distress.  HENT:  Head: Normocephalic and atraumatic.  Right Ear: External ear normal.  Left Ear: External ear normal.  Mouth/Throat: Oropharynx is clear and moist.  Hearing is grossly normal  Eyes: Conjunctivae and EOM are normal. Pupils are equal, round, and reactive to light. No scleral icterus.  Neck: Normal range of motion. Neck supple.  Cardiovascular: Normal rate, regular rhythm, normal heart sounds and intact distal pulses.   No murmur  heard. Pulmonary/Chest: Effort normal and breath sounds normal. He has no wheezes. He has no rales.  Abdominal: Soft. Bowel sounds are normal. He exhibits no distension and no mass.  Musculoskeletal: Normal range of motion. He exhibits no edema.  Lymphadenopathy:    He has no cervical adenopathy.  Neurological: He is alert and oriented to person, place, and time. No cranial nerve deficit.  Skin: Skin is warm and dry.  Scattered circular slightly hypopigmented areas trunk and arms.  Psychiatric: He has a normal mood and affect. His behavior is normal.          Assessment & Plan:

## 2013-05-19 NOTE — Assessment & Plan Note (Signed)
Repeat testosterone levels.  If free testosterone levels are normal, no treatment.  Patient advised to work on weight loss.  Trial of Cialis for mild ED.

## 2013-05-19 NOTE — Assessment & Plan Note (Signed)
Stable.  He understands importance of BP control.  I stressed importance of avoiding NSAIDs.

## 2013-05-19 NOTE — Progress Notes (Signed)
Pre visit review using our clinic review tool, if applicable. No additional management support is needed unless otherwise documented below in the visit note. 

## 2013-05-19 NOTE — Assessment & Plan Note (Signed)
BP well controlled.  No change in medication regimen. BP: 122/84 mmHg   Lab Results  Component Value Date   CREATININE 1.5 04/24/2013   Lab Results  Component Value Date   NA 139 04/24/2013   K 4.0 04/24/2013   CL 104 04/24/2013   CO2 26 04/24/2013

## 2013-05-19 NOTE — Assessment & Plan Note (Signed)
Patient reports he was seen by dermatologist. History with fluconazole but has experienced recurrence of tinea versicolor. Use Fluconazole 100 mg once daily for 10 days.

## 2013-05-20 LAB — TESTOSTERONE, FREE, TOTAL, SHBG
Sex Hormone Binding: 18 nmol/L (ref 13–71)
Testosterone, Free: 62.5 pg/mL (ref 47.0–244.0)
Testosterone-% Free: 2.6 % (ref 1.6–2.9)
Testosterone: 239 ng/dL — ABNORMAL LOW (ref 300–890)

## 2013-05-27 ENCOUNTER — Encounter: Payer: Self-pay | Admitting: Family

## 2013-05-27 ENCOUNTER — Ambulatory Visit (INDEPENDENT_AMBULATORY_CARE_PROVIDER_SITE_OTHER): Payer: BC Managed Care – PPO | Admitting: Family

## 2013-05-27 VITALS — BP 118/76 | HR 81 | Temp 98.8°F | Wt 276.0 lb

## 2013-05-27 DIAGNOSIS — IMO0002 Reserved for concepts with insufficient information to code with codable children: Secondary | ICD-10-CM

## 2013-05-27 MED ORDER — DOXYCYCLINE HYCLATE 100 MG PO TABS: 100.0000 mg | ORAL_TABLET | Freq: Two times a day (BID) | ORAL | Status: DC

## 2013-05-27 NOTE — Patient Instructions (Signed)
Abscess An abscess is an infected area that contains a collection of pus and debris.It can occur in almost any part of the body. An abscess is also known as a furuncle or boil. CAUSES  An abscess occurs when tissue gets infected. This can occur from blockage of oil or sweat glands, infection of hair follicles, or a minor injury to the skin. As the body tries to fight the infection, pus collects in the area and creates pressure under the skin. This pressure causes pain. People with weakened immune systems have difficulty fighting infections and get certain abscesses more often.  SYMPTOMS Usually an abscess develops on the skin and becomes a painful mass that is red, warm, and tender. If the abscess forms under the skin, you may feel a moveable soft area under the skin. Some abscesses break open (rupture) on their own, but most will continue to get worse without care. The infection can spread deeper into the body and eventually into the bloodstream, causing you to feel ill.  DIAGNOSIS  Your caregiver will take your medical history and perform a physical exam. A sample of fluid may also be taken from the abscess to determine what is causing your infection. TREATMENT  Your caregiver may prescribe antibiotic medicines to fight the infection. However, taking antibiotics alone usually does not cure an abscess. Your caregiver may need to make a small cut (incision) in the abscess to drain the pus. In some cases, gauze is packed into the abscess to reduce pain and to continue draining the area. HOME CARE INSTRUCTIONS   Only take over-the-counter or prescription medicines for pain, discomfort, or fever as directed by your caregiver.  If you were prescribed antibiotics, take them as directed. Finish them even if you start to feel better.  If gauze is used, follow your caregiver's directions for changing the gauze.  To avoid spreading the infection:  Keep your draining abscess covered with a  bandage.  Wash your hands well.  Do not share personal care items, towels, or whirlpools with others.  Avoid skin contact with others.  Keep your skin and clothes clean around the abscess.  Keep all follow-up appointments as directed by your caregiver. SEEK MEDICAL CARE IF:   You have increased pain, swelling, redness, fluid drainage, or bleeding.  You have muscle aches, chills, or a general ill feeling.  You have a fever. MAKE SURE YOU:   Understand these instructions.  Will watch your condition.  Will get help right away if you are not doing well or get worse. Document Released: 11/02/2004 Document Revised: 07/25/2011 Document Reviewed: 04/07/2011 Exit[employer] Patient Information 2014 [employer].  

## 2013-05-27 NOTE — Progress Notes (Signed)
Subjective:    Patient ID: Erik Weber, male    DOB: [birth year], 38 y.o.   MRN: [id number]  HPI 38 year old AA male, nonsmoker presenting today for an insect bite.  He was bite 8 days ago while clearing some branches. The bite is on right  forearm. It was swollen and red but he put a tobacco on it.  He has been able to expel the contents of the bite and has gotten smaller. He has used peroxide, alcohol, and neosporin with a bandaid.     Review of Systems  Constitutional: Negative.  Negative for fever and chills.  Respiratory: Negative.  Negative for shortness of breath.   Cardiovascular: Negative.   Musculoskeletal: Negative.   Skin: Positive for wound.       Right forearm  Psychiatric/Behavioral: Negative.    Past Medical History  Diagnosis Date  . Childhood asthma   . Genital warts   . Positive TB test   . Hypertension   . Chronic renal insufficiency     History   Social History  . Marital Status: Single    Spouse Name: N/A    Number of Children: N/A  . Years of Education: N/A   Occupational History  . Not on file.   Social History Main Topics  . Smoking status: Never Smoker   . Smokeless tobacco: Not on file  . Alcohol Use: Not on file  . Drug Use: Not on file  . Sexual Activity: Not on file   Other Topics Concern  . Not on file   Social History Narrative   Occupation: Works for lab   Single    Adopted daughter - 4   Moved from [place] 4 yrs ago.   Never Smoked   Alcohol use-no   Caffeine use/day:  None   Does Patient Exercise:  yes             Past Surgical History  Procedure Laterality Date  . Tonsillectomy      1980's    Family History  Problem Relation Age of Onset  . Kidney cancer Mother 2  . Hypertension Father     age 42  . Other      no colon, no prostate cancer  . Lung cancer      unlce    No Known Allergies  Current Outpatient Prescriptions on File Prior to Visit  Medication Sig Dispense Refill  . amLODipine  (NORVASC) 5 MG tablet Take 1 tablet (5 mg total) by mouth daily.  90 tablet  1  . fluconazole (DIFLUCAN) 100 MG tablet Take 1 tablet (100 mg total) by mouth daily.  7 tablet  0  . olmesartan-hydrochlorothiazide (BENICAR HCT) 20-12.5 MG per tablet Take 1 tablet by mouth daily.  90 tablet  1  . tadalafil (CIALIS) 20 MG tablet 1/2 tablet once daily as needed  3 tablet  0   No current facility-administered medications on file prior to visit.    BP 118/76  Pulse 81  Temp(Src) 98.8 F (37.1 C) (Oral)  Wt 276 lb (125.193 kg)  SpO2 98%chart    Objective:   Physical Exam  Constitutional: He is oriented to person, place, and time. He appears well-developed and well-nourished.  Neck: Normal range of motion. Neck supple.  Cardiovascular: Normal rate, regular rhythm and normal heart sounds.   Pulmonary/Chest: Effort normal and breath sounds normal.  Musculoskeletal: Normal range of motion.  Neurological: He is alert and oriented to person, place, and  time.  Skin: Skin is warm and dry.  Dime sized area on right posterior forearm. Purulent discharge small amount.   Psychiatric: He has a normal mood and affect.          Assessment & Plan:  Assessment 1. Abscess 2. Cellulitus  Plan 1. Doxycycline 100 mg PO BID x 10 days.  2. Contact office for any questions or concerns.

## 2013-05-31 LAB — WOUND CULTURE
GRAM STAIN: NONE SEEN
Gram Stain: NONE SEEN
Gram Stain: NONE SEEN

## 2013-06-02 ENCOUNTER — Other Ambulatory Visit: Payer: Self-pay | Admitting: Family

## 2013-06-02 MED ORDER — MUPIROCIN CALCIUM 2 % NA OINT: 1.0000 "application " | TOPICAL_OINTMENT | Freq: Two times a day (BID) | NASAL | Status: DC

## 2013-06-17 ENCOUNTER — Other Ambulatory Visit: Payer: Self-pay | Admitting: Internal Medicine

## 2013-08-18 ENCOUNTER — Encounter: Payer: Self-pay | Admitting: Internal Medicine

## 2013-08-18 ENCOUNTER — Ambulatory Visit (INDEPENDENT_AMBULATORY_CARE_PROVIDER_SITE_OTHER): Payer: BC Managed Care – PPO | Admitting: Internal Medicine

## 2013-08-18 VITALS — BP 122/84 | Temp 98.3°F | Ht 72.0 in | Wt 266.0 lb

## 2013-08-18 DIAGNOSIS — E669 Obesity, unspecified: Secondary | ICD-10-CM | POA: Insufficient documentation

## 2013-08-18 DIAGNOSIS — I10 Essential (primary) hypertension: Secondary | ICD-10-CM

## 2013-08-18 HISTORY — DX: Obesity, unspecified: E66.9

## 2013-08-18 NOTE — Assessment & Plan Note (Signed)
Patient's BMI is 36. Patient advised to join [employer]. Continue exercise program but with emphasis in engaging in more aerobic exercise versus resistance training. We discussed pros and cons of weight loss medications. He agrees to try lifestyle changes at this point.  Reassess in 3 months. Goal weight loss between 12 to 24 pounds before next office visit

## 2013-08-18 NOTE — Progress Notes (Signed)
Pre visit review using our clinic review tool, if applicable. No additional management support is needed unless otherwise documented below in the visit note. 

## 2013-08-18 NOTE — Assessment & Plan Note (Signed)
Stable. No change in medication. BP: 122/84 mmHg

## 2013-08-18 NOTE — Progress Notes (Signed)
   Subjective:    Patient ID: Erik Weber, male    DOB: [birth year], 38 y.o.   MRN: [id number]  HPI  38 year-old [place] American male with history of hypertension and chronic renal insufficiency for followup. Patient denies significant interval medical history. He has been struggling with obesity for many years. He would like to discuss weight loss options. Currently he has been trying to exercise on a semi-regular basis. 3 days a week, he reports lifting weights and 2 days a week - he is engaging in aerobic exercise. There is been mild weight loss since previous visit (approximately 10 pounds).  He denies any history of compulsive eating disorder. He denies binge eating.  He has not tried Weight [employer]chers in the past.  Review of Systems Negative for chest pain or unusual shortness of breath    Past Medical History  Diagnosis Date  . Childhood asthma   . Genital warts   . Positive TB test   . Hypertension   . Chronic renal insufficiency     History   Social History  . Marital Status: Single    Spouse Name: N/A    Number of Children: N/A  . Years of Education: N/A   Occupational History  . Not on file.   Social History Main Topics  . Smoking status: Never Smoker   . Smokeless tobacco: Not on file  . Alcohol Use: Not on file  . Drug Use: Not on file  . Sexual Activity: Not on file   Other Topics Concern  . Not on file   Social History Narrative   Occupation: Works for lab   Single    Adopted daughter - 4   Moved from [place] 4 yrs ago.   Never Smoked   Alcohol use-no   Caffeine use/day:  None   Does Patient Exercise:  yes             Past Surgical History  Procedure Laterality Date  . Tonsillectomy      1980's    Family History  Problem Relation Age of Onset  . Kidney cancer Mother 19  . Hypertension Father     age 33  . Other      no colon, no prostate cancer  . Lung cancer      unlce    No Known Allergies  Current Outpatient  Prescriptions on File Prior to Visit  Medication Sig Dispense Refill  . amLODipine (NORVASC) 5 MG tablet Take 1 tablet (5 mg total) by mouth daily.  90 tablet  1  . olmesartan-hydrochlorothiazide (BENICAR HCT) 20-12.5 MG per tablet Take 1 tablet by mouth daily.  90 tablet  1   No current facility-administered medications on file prior to visit.    BP 122/84  Temp(Src) 98.3 F (36.8 C) (Oral)  Ht 6' (1.829 m)  Wt 266 lb (120.657 kg)  BMI 36.07 kg/m2    Objective:   Physical Exam  Constitutional: He is oriented to person, place, and time. He appears well-developed and well-nourished. No distress.  Cardiovascular: Normal rate, regular rhythm and normal heart sounds.   No murmur heard. Pulmonary/Chest: Effort normal and breath sounds normal. He has no wheezes.  Musculoskeletal: He exhibits no edema.  Neurological: He is alert and oriented to person, place, and time.  Psychiatric: He has a normal mood and affect. His behavior is normal.          Assessment & Plan:

## 2013-08-22 ENCOUNTER — Telehealth: Payer: Self-pay | Admitting: Internal Medicine

## 2013-08-22 NOTE — Telephone Encounter (Signed)
Pt had physical on 05/19/13 however was also billed for a out patient office visit, pt states he did not ask any additional questions and wants to know why he was billed extra. Pt was billed $160.00.

## 2013-08-25 NOTE — Telephone Encounter (Signed)
Please advise 

## 2013-08-25 NOTE — Telephone Encounter (Signed)
Blood pressure issue and tinea versicolor issue addressed.  ED also addressed.  These are not included in preventative health care.

## 2013-08-26 NOTE — Telephone Encounter (Signed)
Pt aware.

## 2013-10-02 NOTE — Telephone Encounter (Signed)
Pt is wanting to speak with you regarding bill he received. Pt states Jenny Reichmann called him and made him aware of the charges. Pt is disputing the charges because pt states he has never been to the ED dept in [place] so that was never addressed and also pt states as he was leaving the room he simple raised his arm and stated to dr. Shawna Orleans that the dermatologist had written you a rx for it and he states dr. Shawna Orleans stated he knew what the rx was and he would fill it. Pt states he was there for his yearly exam. Doesn't understand what should have been addressed if he is coming for a exam, pt states it seems as though he suppose to come for the exam and not say anything. Pt requesting a call back directly from site manager.

## 2013-11-04 ENCOUNTER — Telehealth: Payer: Self-pay | Admitting: Internal Medicine

## 2013-11-04 NOTE — Telephone Encounter (Signed)
Pt is calling to try new medication prep truvada. Pt did not elaborate reason to try [facility]

## 2013-11-07 NOTE — Telephone Encounter (Signed)
Do you want him to be seen for this?

## 2013-11-07 NOTE — Telephone Encounter (Signed)
Spoke with patient and he will call back to schedule an appointment to discuss a prescription for new medications.

## 2013-11-07 NOTE — Telephone Encounter (Signed)
Erik Weber,  Can you help with this.  Not sure why pt asking for HIV medication.  He there has been high risk exposure to HIV positive pt - he needs to be seen right away to determine whether he needs to take HIV prophylactic medication. Very important.  A [facility] provider may need to talk with ID specialist regarding this matter.

## 2013-11-07 NOTE — Telephone Encounter (Signed)
He needs to be seen ASAP

## 2013-11-28 ENCOUNTER — Ambulatory Visit (INDEPENDENT_AMBULATORY_CARE_PROVIDER_SITE_OTHER): Payer: BC Managed Care – PPO | Admitting: Internal Medicine

## 2013-11-28 ENCOUNTER — Encounter: Payer: Self-pay | Admitting: Internal Medicine

## 2013-11-28 VITALS — BP 126/90 | HR 72 | Temp 98.7°F | Ht 72.0 in | Wt 260.0 lb

## 2013-11-28 DIAGNOSIS — N528 Other male erectile dysfunction: Secondary | ICD-10-CM

## 2013-11-28 DIAGNOSIS — B360 Pityriasis versicolor: Secondary | ICD-10-CM

## 2013-11-28 DIAGNOSIS — Z717 Human immunodeficiency virus [HIV] counseling: Secondary | ICD-10-CM

## 2013-11-28 DIAGNOSIS — I10 Essential (primary) hypertension: Secondary | ICD-10-CM

## 2013-11-28 DIAGNOSIS — N259 Disorder resulting from impaired renal tubular function, unspecified: Secondary | ICD-10-CM

## 2013-11-28 DIAGNOSIS — Z0000 Encounter for general adult medical examination without abnormal findings: Secondary | ICD-10-CM

## 2013-11-28 LAB — BASIC METABOLIC PANEL
BUN: 13 mg/dL (ref 6–23)
CALCIUM: 9.6 mg/dL (ref 8.4–10.5)
CHLORIDE: 103 meq/L (ref 96–112)
CO2: 19 meq/L (ref 19–32)
Creatinine, Ser: 1.6 mg/dL — ABNORMAL HIGH (ref 0.4–1.5)
GFR: 64.47 mL/min (ref >60.00)
GLUCOSE: 80 mg/dL (ref 70–99)
Potassium: 3.9 mEq/L (ref 3.5–5.1)
SODIUM: 138 meq/L (ref 135–145)

## 2013-11-28 MED ORDER — OLMESARTAN MEDOXOMIL-HCTZ 20-12.5 MG PO TABS: 1.0000 | ORAL_TABLET | Freq: Every day | ORAL | Status: DC

## 2013-11-28 MED ORDER — TADALAFIL 5 MG PO TABS: 5.0000 mg | ORAL_TABLET | Freq: Every day | ORAL | Status: DC | PRN

## 2013-11-28 MED ORDER — AMLODIPINE BESYLATE 5 MG PO TABS: 5.0000 mg | ORAL_TABLET | Freq: Every day | ORAL | Status: DC

## 2013-11-28 MED ORDER — FLUCONAZOLE 100 MG PO TABS: 100.0000 mg | ORAL_TABLET | Freq: Every day | ORAL | Status: DC

## 2013-11-28 NOTE — Assessment & Plan Note (Signed)
Monitor electrolytes and kidney function.  Continue ARB.

## 2013-11-28 NOTE — Assessment & Plan Note (Signed)
Well controlled.  No change in medication regimen.

## 2013-11-28 NOTE — Progress Notes (Signed)
Pre visit review using our clinic review tool, if applicable. No additional management support is needed unless otherwise documented below in the visit note. 

## 2013-11-28 NOTE — Progress Notes (Signed)
   Subjective:    Patient ID: Erik Weber, male    DOB: [birth year], 38 y.o.   MRN: [id number]  HPI  38 year old African American male with history of chronic hypertension, mild renal insufficiency and tinea versicolor for followup.  Hypertension-he reports good medication compliance. His blood pressure is well-controlled.  Tinea versicolor-he was treated with Diflucan in the past. He reports recurrence of rash on left arm and torso.  Patient is high-risk for HIV infection. He is currently in a relationship with someone that is known HIV positive. Patient reports he is not engaged in any sexual activity. He would like to consider taking prophylactic medication for HIV prevention.  Review of Systems Negative for chest pain, mild weight loss.  He is using "weight loss app"    Past Medical History  Diagnosis Date  . Childhood asthma   . Genital warts   . Positive TB test   . Hypertension   . Chronic renal insufficiency     History   Social History  . Marital Status: Single    Spouse Name: N/A    Number of Children: N/A  . Years of Education: N/A   Occupational History  . Not on file.   Social History Main Topics  . Smoking status: Never Smoker   . Smokeless tobacco: Not on file  . Alcohol Use: Not on file  . Drug Use: Not on file  . Sexual Activity: Not on file   Other Topics Concern  . Not on file   Social History Narrative   Occupation: Works for lab   Single    Adopted daughter - 4   Moved from [place] 4 yrs ago.   Never Smoked   Alcohol use-no   Caffeine use/day:  None   Does Patient Exercise:  yes             Past Surgical History  Procedure Laterality Date  . Tonsillectomy      1980's    Family History  Problem Relation Age of Onset  . Kidney cancer Mother 49  . Hypertension Father     age 6  . Other      no colon, no prostate cancer  . Lung cancer      unlce    No Known Allergies  No current outpatient prescriptions on file prior  to visit.   No current facility-administered medications on file prior to visit.    BP 126/90  Pulse 72  Temp(Src) 98.7 F (37.1 C) (Oral)  Ht 6' (1.829 m)  Wt 260 lb (117.935 kg)  BMI 35.25 kg/m2    Objective:   Physical Exam  Constitutional: He is oriented to person, place, and time. He appears well-developed and well-nourished.  Cardiovascular: Normal rate, regular rhythm and normal heart sounds.   Pulmonary/Chest: Effort normal and breath sounds normal. He has no wheezes.  Musculoskeletal: He exhibits no edema.  Neurological: He is alert and oriented to person, place, and time.  Skin: Skin is warm and dry.  Scattered annular hypopigmented areas on left forearm, and upper torso  Psychiatric: He has a normal mood and affect. His behavior is normal.          Assessment & Plan:

## 2013-11-28 NOTE — Assessment & Plan Note (Signed)
Recurrent tinea versicolor.  Retreat with diflucan.

## 2013-11-28 NOTE — Assessment & Plan Note (Signed)
Patient currently in a relationship with HIV positive partner. They're not sexually active. He is interested in taking HIV prophylactic medication. Discussed patient with Dr. Johnnye Sima. Patient not a candidate for Truvada secondary to renal insufficiency. Refer to Dr. Johnnye Sima for further evaluation and possible treatment (Epzicom).  Patient to be screen for HLA-B*[id number] allele.

## 2013-12-02 ENCOUNTER — Telehealth: Payer: Self-pay | Admitting: Internal Medicine

## 2013-12-02 NOTE — Telephone Encounter (Signed)
emmi emailed °

## 2013-12-18 ENCOUNTER — Other Ambulatory Visit: Payer: Self-pay | Admitting: Orthopaedic Surgery

## 2013-12-18 DIAGNOSIS — M47816 Spondylosis without myelopathy or radiculopathy, lumbar region: Secondary | ICD-10-CM

## 2013-12-23 ENCOUNTER — Ambulatory Visit (INDEPENDENT_AMBULATORY_CARE_PROVIDER_SITE_OTHER): Payer: BC Managed Care – PPO | Admitting: Infectious Diseases

## 2013-12-23 ENCOUNTER — Encounter: Payer: Self-pay | Admitting: Infectious Diseases

## 2013-12-23 VITALS — BP 142/84 | HR 88 | Temp 99.0°F | Ht 71.0 in | Wt 256.0 lb

## 2013-12-23 DIAGNOSIS — Z0000 Encounter for general adult medical examination without abnormal findings: Secondary | ICD-10-CM

## 2013-12-23 DIAGNOSIS — Z7289 Other problems related to lifestyle: Secondary | ICD-10-CM

## 2013-12-23 DIAGNOSIS — Z609 Problem related to social environment, unspecified: Secondary | ICD-10-CM

## 2013-12-23 MED ORDER — EMTRICITABINE-TENOFOVIR DF 200-300 MG PO TABS: 1.0000 | ORAL_TABLET | Freq: Every day | ORAL | Status: DC

## 2013-12-23 NOTE — Assessment & Plan Note (Addendum)
He is seen today for PRRP evaluation. His Cr Cl is his greatest concern to giving him Truvada.  We discussed safe sex practices. I emphasized the importance of condom use. He will get prescription and discuss with his insurance company regarding coverage for this.  He will start TRV and then will see him back in 1 month for repeat Cr.    V69.8 (ICD9 for PREP) (Z72.51 ICD10?)

## 2013-12-23 NOTE — Progress Notes (Signed)
   Subjective:    Patient ID: Erik Weber, male    DOB: 1975/10/26, 38 y.o.   MRN: [id number]  HPI 38 yo M with hx of mild CRI and HTN (on benicar).  Has current HIV+ MSM partner. Partner is currently on ART. He is undetectable. Not clear what ART he is on. Has not had intercourse.     Review of Systems  Constitutional: Negative for appetite change and unexpected weight change.  HENT: Negative for nosebleeds.   Gastrointestinal: Negative for diarrhea and constipation.  Genitourinary: Negative for difficulty urinating.  Hematological: Negative for adenopathy.       Objective:   Physical Exam  Constitutional: He appears well-developed and well-nourished.  HENT:  Mouth/Throat: No oropharyngeal exudate.  Eyes: EOM are normal. Pupils are equal, round, and reactive to light.  Neck: Neck supple.  Cardiovascular: Normal rate, regular rhythm and normal heart sounds.   Pulmonary/Chest: Effort normal and breath sounds normal.  Abdominal: Soft. Bowel sounds are normal. He exhibits no distension. There is no tenderness.  Musculoskeletal: He exhibits edema.  Trace LE edema  Lymphadenopathy:    He has no cervical adenopathy.          Assessment & Plan:

## 2014-01-05 ENCOUNTER — Ambulatory Visit
Admission: RE | Admit: 2014-01-05 | Discharge: 2014-01-05 | Disposition: A | Discharge status: Home / Self Care | Payer: BC Managed Care – PPO | Source: Ambulatory Visit | Attending: Orthopaedic Surgery | Admitting: Orthopaedic Surgery

## 2014-01-05 DIAGNOSIS — M47816 Spondylosis without myelopathy or radiculopathy, lumbar region: Secondary | ICD-10-CM

## 2014-01-06 ENCOUNTER — Other Ambulatory Visit: Payer: Self-pay

## 2014-03-02 ENCOUNTER — Ambulatory Visit: Payer: BC Managed Care – PPO | Admitting: Internal Medicine

## 2014-03-25 ENCOUNTER — Telehealth: Payer: Self-pay | Admitting: Internal Medicine

## 2014-03-25 DIAGNOSIS — Z0000 Encounter for general adult medical examination without abnormal findings: Secondary | ICD-10-CM

## 2014-03-25 NOTE — Telephone Encounter (Signed)
Pt prefers to go [facility] for his upcoming cpe w/ dr you.  Can you put those orders in? Thanks!

## 2014-03-26 NOTE — Telephone Encounter (Signed)
Future orders placed 

## 2014-04-15 ENCOUNTER — Other Ambulatory Visit (INDEPENDENT_AMBULATORY_CARE_PROVIDER_SITE_OTHER): Payer: BLUE CROSS/BLUE SHIELD

## 2014-04-15 ENCOUNTER — Other Ambulatory Visit: Payer: Self-pay

## 2014-04-15 DIAGNOSIS — Z0000 Encounter for general adult medical examination without abnormal findings: Secondary | ICD-10-CM

## 2014-04-15 DIAGNOSIS — E291 Testicular hypofunction: Secondary | ICD-10-CM

## 2014-04-15 LAB — BASIC METABOLIC PANEL
BUN: 13 mg/dL (ref 6–23)
CALCIUM: 9.7 mg/dL (ref 8.4–10.5)
CO2: 30 mEq/L (ref 19–32)
Chloride: 102 mEq/L (ref 96–112)
Creatinine, Ser: 1.73 mg/dL — ABNORMAL HIGH (ref 0.40–1.50)
GFR: 56.68 mL/min — ABNORMAL LOW (ref >60.00)
Glucose, Bld: 91 mg/dL (ref 70–99)
POTASSIUM: 4.1 meq/L (ref 3.5–5.1)
SODIUM: 138 meq/L (ref 135–145)

## 2014-04-15 LAB — CBC WITH DIFFERENTIAL/PLATELET
Basophils Absolute: 0 10*3/uL (ref 0.0–0.1)
Basophils Relative: 0.5 % (ref 0.0–3.0)
Eosinophils Absolute: 0.3 10*3/uL (ref 0.0–0.7)
Eosinophils Relative: 3.2 % (ref 0.0–5.0)
HCT: 49.2 % (ref 39.0–52.0)
Hemoglobin: 16.5 g/dL (ref 13.0–17.0)
LYMPHS PCT: 32.7 % (ref 12.0–46.0)
Lymphs Abs: 2.9 10*3/uL (ref 0.7–4.0)
MCHC: 33.6 g/dL (ref 30.0–36.0)
MCV: 84.8 fl (ref 78.0–100.0)
Monocytes Absolute: 0.6 10*3/uL (ref 0.1–1.0)
Monocytes Relative: 6.4 % (ref 3.0–12.0)
Neutro Abs: 5.2 10*3/uL (ref 1.4–7.7)
Neutrophils Relative %: 57.2 % (ref 43.0–77.0)
Platelets: 302 10*3/uL (ref 150.0–400.0)
RBC: 5.8 Mil/uL (ref 4.22–5.81)
RDW: 13.6 % (ref 11.5–15.5)
WBC: 9 10*3/uL (ref 4.0–10.5)

## 2014-04-15 LAB — LIPID PANEL
CHOL/HDL RATIO: 5
CHOLESTEROL: 200 mg/dL (ref 0–200)
HDL: 40.3 mg/dL (ref >39.00)
LDL Cholesterol: 124 mg/dL — ABNORMAL HIGH (ref 0–99)
NonHDL: 159.7
Triglycerides: 179 mg/dL — ABNORMAL HIGH (ref 0.0–149.0)
VLDL: 35.8 mg/dL (ref 0.0–40.0)

## 2014-04-15 LAB — URINALYSIS
BILIRUBIN URINE: NEGATIVE
Hgb urine dipstick: NEGATIVE
Ketones, ur: NEGATIVE
LEUKOCYTES UA: NEGATIVE
Nitrite: NEGATIVE
Specific Gravity, Urine: 1.01 (ref 1.000–1.030)
Total Protein, Urine: NEGATIVE
Urine Glucose: NEGATIVE
Urobilinogen, UA: 0.2 (ref 0.0–1.0)
pH: 7 (ref 5.0–8.0)

## 2014-04-15 LAB — HEPATIC FUNCTION PANEL
ALK PHOS: 58 U/L (ref 39–117)
ALT: 27 U/L (ref 0–53)
AST: 20 U/L (ref 0–37)
Albumin: 4.5 g/dL (ref 3.5–5.2)
Bilirubin, Direct: 0.2 mg/dL (ref 0.0–0.3)
TOTAL PROTEIN: 7.7 g/dL (ref 6.0–8.3)
Total Bilirubin: 0.8 mg/dL (ref 0.2–1.2)

## 2014-04-15 LAB — TSH: TSH: 1.37 u[IU]/mL (ref 0.35–4.50)

## 2014-04-15 NOTE — Addendum Note (Signed)
Addended by: Colleen Can on: 04/15/2014 08:39 AM   Modules accepted: Orders

## 2014-04-16 LAB — HIV ANTIBODY (ROUTINE TESTING W REFLEX): HIV 1&2 Ab, 4th Generation: NONREACTIVE

## 2014-04-17 LAB — TESTOSTERONE,FREE AND TOTAL
TESTOSTERONE FREE: 9.9 pg/mL (ref 8.7–25.1)
Testosterone: 411 ng/dL (ref 348–1197)

## 2014-04-22 ENCOUNTER — Ambulatory Visit (INDEPENDENT_AMBULATORY_CARE_PROVIDER_SITE_OTHER): Payer: BLUE CROSS/BLUE SHIELD | Admitting: Internal Medicine

## 2014-04-22 ENCOUNTER — Encounter: Payer: Self-pay | Admitting: Internal Medicine

## 2014-04-22 VITALS — BP 118/80 | HR 80 | Temp 98.8°F | Ht 72.0 in | Wt 258.0 lb

## 2014-04-22 DIAGNOSIS — Z0000 Encounter for general adult medical examination without abnormal findings: Secondary | ICD-10-CM

## 2014-04-22 MED ORDER — OLMESARTAN MEDOXOMIL-HCTZ 20-12.5 MG PO TABS: 1.0000 | ORAL_TABLET | Freq: Every day | ORAL | Status: DC

## 2014-04-22 MED ORDER — AMLODIPINE BESYLATE 5 MG PO TABS: 5.0000 mg | ORAL_TABLET | Freq: Every day | ORAL | Status: DC

## 2014-04-22 NOTE — Progress Notes (Signed)
Subjective:    Patient ID: Erik Weber, male    DOB: 10-23-1975, 39 y.o.   MRN: [id number]  HPI  39 year old African-American male with history of hypertension, chronic renal insufficiency and obesity for routine physical.  Interval medical history-patient seen by infectious disease specialist. Patient was prescribed Truvada but never started. He reports ending his relationship with his previous partner.  CRI - serum Cr slightly worse.  Patient denies dehydrated illness, hypotension or use of over-the-counter NSAIDs.  Review of Systems  Constitutional: Negative for activity change, appetite change and unexpected weight change.  Eyes: Negative for visual disturbance.  Respiratory: Negative for cough, chest tightness and shortness of breath.   Cardiovascular: Negative for chest pain.  Genitourinary: Negative for difficulty urinating.  Neurological: Negative for headaches.  Gastrointestinal: Negative for abdominal pain, heartburn melena or hematochezia Psych: Negative for depression or anxiety Endo:  No polyuria or polydypsia        Past Medical History  Diagnosis Date  . Childhood asthma   . Positive TB test   . Hypertension   . Chronic renal insufficiency     History   Social History  . Marital Status: Single    Spouse Name: N/A  . Number of Children: N/A  . Years of Education: N/A   Occupational History  . Not on file.   Social History Main Topics  . Smoking status: Never Smoker   . Smokeless tobacco: Never Used  . Alcohol Use: No  . Drug Use: No  . Sexual Activity:    Partners: Male   Other Topics Concern  . Not on file   Social History Narrative   Occupation: Works for lab   Single    Adopted daughter - 4   Moved from [place] 4 yrs ago.   Never Smoked   Alcohol use-no   Caffeine use/day:  None   Does Patient Exercise:  yes             Past Surgical History  Procedure Laterality Date  . Tonsillectomy      1980's    Family History    Problem Relation Age of Onset  . Kidney cancer Mother 9  . Hypertension Father     age 74  . Other      no colon, no prostate cancer  . Lung cancer      unlce    No Known Allergies  Current Outpatient Prescriptions on File Prior to Visit  Medication Sig Dispense Refill  . amLODipine (NORVASC) 5 MG tablet Take 1 tablet (5 mg total) by mouth daily. 90 tablet 1  . emtricitabine-tenofovir (TRUVADA) 200-300 MG per tablet Take 1 tablet by mouth daily. 90 tablet 5  . olmesartan-hydrochlorothiazide (BENICAR HCT) 20-12.5 MG per tablet Take 1 tablet by mouth daily. 90 tablet 1  . tadalafil (CIALIS) 5 MG tablet Take 1 tablet (5 mg total) by mouth daily as needed for erectile dysfunction. 30 tablet 0   No current facility-administered medications on file prior to visit.    BP 118/80 mmHg  Pulse 80  Temp(Src) 98.8 F (37.1 C) (Oral)  Ht 6' (1.829 m)  Wt 258 lb (117.028 kg)  BMI 34.98 kg/m2     Objective:   Physical Exam  Constitutional: Appears well-developed and well-nourished. No distress.  Head: Normocephalic and atraumatic.  Ear:  Right and left ear normal.  TMs clear.  Hearing is grossly normal Mouth/Throat: Oropharynx is clear and moist.  Eyes: Conjunctivae are normal. Pupils are equal,  round, and reactive to light.  Neck: Normal range of motion. Neck supple. No thyromegaly present. No carotid bruit Cardiovascular: Normal rate, regular rhythm and normal heart sounds.  Exam reveals no gallop and no friction rub.  No murmur heard. Pulmonary/Chest: Effort normal and breath sounds normal.  No wheezes. No rales.  Abdominal: Soft. Bowel sounds are normal. No mass. There is no tenderness.  Neurological: Alert. No cranial nerve deficit.  Skin: Skin is warm and dry.  Psychiatric: Normal mood and affect. Behavior is normal.         Assessment & Plan:

## 2014-04-22 NOTE — Assessment & Plan Note (Signed)
Reviewed adult health maintenance protocols.  Patient urged to follow up with ID specialist.  Screening blood work reviewed.  Weight loss and regular exercise encouraged.  Increase fluid / water intake.  Continue following up with nephrologist.

## 2014-04-22 NOTE — Patient Instructions (Signed)
Increase your fluid intake to 6-8 glasses of water per day Monitor your blood pressure at home.  Contact our office if your systolic BP is less than 063 Follow-up with infectious disease specialist and nephrologist as planned Please complete the following lab tests before your next follow up appointment: BMET - hypertension, renal insufficiency Work on 20-25 lbs weight loss over next 6 - 12 months

## 2014-04-22 NOTE — Progress Notes (Signed)
Pre visit review using our clinic review tool, if applicable. No additional management support is needed unless otherwise documented below in the visit note. 

## 2014-04-27 ENCOUNTER — Other Ambulatory Visit: Payer: Self-pay

## 2014-05-01 ENCOUNTER — Other Ambulatory Visit: Payer: Self-pay

## 2014-05-08 ENCOUNTER — Encounter: Payer: Self-pay | Admitting: Internal Medicine

## 2014-07-13 ENCOUNTER — Telehealth: Payer: Self-pay | Admitting: Internal Medicine

## 2014-07-13 NOTE — Telephone Encounter (Signed)
Jenny Reichmann can you confirm MD Shawna Orleans is okay with prescription refill for Myrtis Ser?

## 2014-07-13 NOTE — Telephone Encounter (Signed)
Dr Shawna Orleans, this is not on his med list.  Please advise

## 2014-07-13 NOTE — Telephone Encounter (Signed)
Pt said the med was called [place]

## 2014-07-13 NOTE — Telephone Encounter (Signed)
Pt call asking about a medicine that he does not remember the name of. I ask him what was it used for and he blotches on his skin and he think the medicine start with a C . Pt is asking for a call back about the medicine

## 2014-07-13 NOTE — Telephone Encounter (Signed)
Medication is tenia versicolor. [facility]

## 2014-07-13 NOTE — Telephone Encounter (Signed)
Can you call patient and see what he needs?  Thank you

## 2014-07-13 NOTE — Telephone Encounter (Signed)
He was previously prescribed diflucan for condition - tinea versicolor.  If he is having recurrence, call in difulcan 150 mg take 2 tabs or 300 mg once weekly for 2 weeks. # 4 No RF.

## 2014-07-15 MED ORDER — FLUCONAZOLE 150 MG PO TABS: 300.0000 mg | ORAL_TABLET | ORAL | Status: DC

## 2014-07-15 NOTE — Telephone Encounter (Signed)
rx sent in electronically 

## 2014-07-16 ENCOUNTER — Telehealth: Payer: Self-pay | Admitting: Internal Medicine

## 2014-07-16 NOTE — Telephone Encounter (Signed)
Patient has appointment tomorrow with MD Sarajane Jews

## 2014-07-16 NOTE — Telephone Encounter (Signed)
Patient Name: Erik Weber  DOB: 06-26-1975    Initial Comment Caller states he is having c diff infection sx.   Nurse Assessment  Nurse: Raphael Gibney, RN, Vanita Ingles Date/Time (Eastern Time): 07/16/2014 11:26:41 AM  Confirm and document reason for call. If symptomatic, describe symptoms. ---Caller states he is having frequent diarrhea. Diarrhea started on Saturday. No blood. No fever or vomiting. Has diarrhea every time he eats. He is drinking fluids and he has urinated.  Has the patient traveled out of the country within the last 30 days? ---No  Does the patient require triage? ---Yes  Related visit to physician within the last 2 weeks? ---No  Does the PT have any chronic conditions? (i.e. diabetes, asthma, etc.) ---Yes  List chronic conditions. ---HTN     Guidelines    Guideline Title Affirmed Question Affirmed Notes  Diarrhea Abdominal pain (Exception: Pain clears with each passage of diarrhea stool)    Final Disposition User   See Physician within 24 Hours [place], Therapist, sports, Vanita Ingles

## 2014-07-17 ENCOUNTER — Ambulatory Visit (INDEPENDENT_AMBULATORY_CARE_PROVIDER_SITE_OTHER): Payer: BLUE CROSS/BLUE SHIELD | Admitting: Family Medicine

## 2014-07-17 ENCOUNTER — Encounter: Payer: Self-pay | Admitting: Family Medicine

## 2014-07-17 VITALS — BP 128/80 | HR 72 | Temp 98.5°F | Resp 16 | Ht 72.0 in | Wt 253.0 lb

## 2014-07-17 DIAGNOSIS — K529 Noninfective gastroenteritis and colitis, unspecified: Secondary | ICD-10-CM

## 2014-07-17 MED ORDER — METRONIDAZOLE 500 MG PO TABS: 500.0000 mg | ORAL_TABLET | Freq: Three times a day (TID) | ORAL | Status: DC

## 2014-07-17 MED ORDER — PROMETHAZINE HCL 25 MG PO TABS: 25.0000 mg | ORAL_TABLET | ORAL | Status: DC | PRN

## 2014-07-17 MED ORDER — CIPROFLOXACIN HCL 500 MG PO TABS: 500.0000 mg | ORAL_TABLET | Freq: Two times a day (BID) | ORAL | Status: DC

## 2014-07-17 NOTE — Progress Notes (Signed)
Pre visit review using our clinic review tool, if applicable. No additional management support is needed unless otherwise documented below in the visit note. 

## 2014-07-17 NOTE — Progress Notes (Signed)
   Subjective:    Patient ID: Erik Weber, male    DOB: [birth year], 39 y.o.   MRN: [id number]  HPI Here for one week of intermittent generalized abdominal pains, diarrhea, and nausea without vomiting. No fever. This started after he ate a meal of sushi. Drinking fluids. He has not taken anything for this. His appetite is down. Eating food makes him feel worse.    Review of Systems  Constitutional: Negative.   Respiratory: Negative.   Cardiovascular: Negative.   Gastrointestinal: Positive for nausea, abdominal pain and diarrhea. Negative for vomiting, constipation, blood in stool, abdominal distention, anal bleeding and rectal pain.  Genitourinary: Negative.        Objective:   Physical Exam  Constitutional: He appears well-developed and well-nourished. No distress.  Eyes: Conjunctivae are normal. No scleral icterus.  Neck: No thyromegaly present.  Pulmonary/Chest: Effort normal and breath sounds normal.  Abdominal: Soft. Bowel sounds are normal. He exhibits no distension and no mass. There is no rebound and no guarding.  Mild diffuse tenderness   Lymphadenopathy:    He has no cervical adenopathy.          Assessment & Plan:  Enteritis. Treat with Flagyl and Cipro. Use Phenergan prn. Add Imodium AD prn.

## 2014-08-01 ENCOUNTER — Other Ambulatory Visit: Payer: Self-pay | Admitting: Internal Medicine

## 2014-08-06 ENCOUNTER — Telehealth: Payer: Self-pay | Admitting: Internal Medicine

## 2014-08-06 NOTE — Telephone Encounter (Signed)
PA for Cialis 5 mg was approved but patient's plan will only allow 6 per 30 days.

## 2014-08-11 NOTE — Telephone Encounter (Signed)
Inform pt of ins co only covering 6 tabs.  He may want to consider getting Cialis 20 mg - 6 tabs per month.

## 2014-08-13 NOTE — Telephone Encounter (Signed)
Left message for pt to call back  °

## 2014-08-13 NOTE — Telephone Encounter (Signed)
Pt is return Erik Weber

## 2014-08-14 MED ORDER — TADALAFIL 20 MG PO TABS: 20.0000 mg | ORAL_TABLET | Freq: Every day | ORAL | Status: DC | PRN

## 2014-08-14 NOTE — Telephone Encounter (Signed)
rx sent in electronically 

## 2014-08-14 NOTE — Telephone Encounter (Signed)
Pt returned your call and said that Cialis 14m would be ok to call in for him.

## 2014-08-21 MED ORDER — TADALAFIL 20 MG PO TABS: 20.0000 mg | ORAL_TABLET | Freq: Every day | ORAL | Status: DC | PRN

## 2014-08-21 NOTE — Addendum Note (Signed)
Addended by: Westley Hummer B on: 08/21/2014 05:18 PM   Modules accepted: Orders

## 2014-08-21 NOTE — Telephone Encounter (Signed)
RX was sent in for 60 instead of 6.  Can you please re-send Cialis 20 mg to [facility] on [address]?

## 2014-08-26 ENCOUNTER — Telehealth: Payer: Self-pay | Admitting: Family Medicine

## 2014-08-26 NOTE — Telephone Encounter (Signed)
Rec'd from [facility] forward 3 pages to Dr. Sarajane Jews

## 2014-10-16 ENCOUNTER — Telehealth: Payer: Self-pay | Admitting: *Deleted

## 2014-10-16 NOTE — Telephone Encounter (Signed)
Pt called and needs a refill of his Cialis 63m. His insurance will only cover 5 tablets and pt needs 30 tablets. This med needs a prior auth before insurance will approve the 30 tablets. Please advise MD. Thanks

## 2014-10-20 MED ORDER — TADALAFIL 5 MG PO TABS: 5.0000 mg | ORAL_TABLET | Freq: Every day | ORAL | Status: DC | PRN

## 2014-10-20 NOTE — Telephone Encounter (Signed)
New Rx sent and awaiting prior auth.

## 2014-12-28 ENCOUNTER — Encounter: Payer: Self-pay | Admitting: Family

## 2014-12-28 ENCOUNTER — Ambulatory Visit (INDEPENDENT_AMBULATORY_CARE_PROVIDER_SITE_OTHER): Payer: BLUE CROSS/BLUE SHIELD | Admitting: Family

## 2014-12-28 VITALS — BP 126/77 | HR 85 | Temp 98.6°F | Resp 16 | Ht 72.0 in | Wt 252.0 lb

## 2014-12-28 DIAGNOSIS — R319 Hematuria, unspecified: Secondary | ICD-10-CM | POA: Diagnosis not present

## 2014-12-28 DIAGNOSIS — R8299 Other abnormal findings in urine: Secondary | ICD-10-CM

## 2014-12-28 DIAGNOSIS — N289 Disorder of kidney and ureter, unspecified: Secondary | ICD-10-CM | POA: Diagnosis not present

## 2014-12-28 DIAGNOSIS — R82998 Other abnormal findings in urine: Secondary | ICD-10-CM

## 2014-12-28 LAB — POCT URINALYSIS DIPSTICK
Bilirubin, UA: NEGATIVE
Glucose, UA: NEGATIVE
Ketones, UA: NEGATIVE
LEUKOCYTES UA: NEGATIVE
Nitrite, UA: NEGATIVE
Spec Grav, UA: >=1.03
UROBILINOGEN UA: NEGATIVE
pH, UA: 5

## 2014-12-28 MED ORDER — CIPROFLOXACIN HCL 250 MG PO TABS: 250.0000 mg | ORAL_TABLET | Freq: Two times a day (BID) | ORAL | Status: DC

## 2014-12-28 NOTE — Addendum Note (Signed)
Addended by: Harl Bowie on: 12/28/2014 05:07 PM   Modules accepted: Orders

## 2014-12-28 NOTE — Progress Notes (Signed)
Subjective:    Patient ID: Erik Weber, male    DOB: 1976-01-07, 39 y.o.   MRN: [id number]  HPI  Mr.  Weber is a 39 yr old male who presents today to discuss dark urine.  He reports that he noted urine to be dark since Saturday afternoon.  Has been drinking cranberry juice and water.  Reports that color has been getting lighter with increased fluid intake.  Denies hx of kidney stones.   Review of Systems  Genitourinary: Negative for dysuria, frequency and hematuria.  Musculoskeletal: Negative for back pain.   See HPI  Past Medical History  Diagnosis Date  . Childhood asthma   . Positive TB test   . Hypertension   . Chronic renal insufficiency     Social History   Social History  . Marital Status: Single    Spouse Name: N/A  . Number of Children: N/A  . Years of Education: N/A   Occupational History  . Not on file.   Social History Main Topics  . Smoking status: Never Smoker   . Smokeless tobacco: Never Used  . Alcohol Use: No  . Drug Use: No  . Sexual Activity:    Partners: Male   Other Topics Concern  . Not on file   Social History Narrative   Occupation: Works for lab   Single    Adopted daughter - 4   Moved from [place] 4 yrs ago.   Never Smoked   Alcohol use-no   Caffeine use/day:  None   Does Patient Exercise:  yes             Past Surgical History  Procedure Laterality Date  . Tonsillectomy      1980's    Family History  Problem Relation Age of Onset  . Kidney cancer Mother 73  . Hypertension Father     age 84  . Other      no colon, no prostate cancer  . Lung cancer      unlce    No Known Allergies  Current Outpatient Prescriptions on File Prior to Visit  Medication Sig Dispense Refill  . amLODipine (NORVASC) 5 MG tablet Take 1 tablet (5 mg total) by mouth daily. 90 tablet 1  . olmesartan-hydrochlorothiazide (BENICAR HCT) 20-12.5 MG per tablet Take 1 tablet by mouth daily. 90 tablet 1   No current facility-administered  medications on file prior to visit.    BP 126/77 mmHg  Pulse 85  Temp(Src) 98.6 F (37 C) (Oral)  Resp 16  Ht 6' (1.829 m)  Wt 252 lb (114.306 kg)  BMI 34.17 kg/m2  SpO2 98%       Objective:   Physical Exam  Constitutional: He is oriented to person, place, and time. He appears well-developed and well-nourished. No distress.  HENT:  Head: Normocephalic and atraumatic.  Cardiovascular: Normal rate and regular rhythm.   No murmur heard. Pulmonary/Chest: Effort normal and breath sounds normal. No respiratory distress. He has no wheezes. He has no rales.  Abdominal: Soft. He exhibits no distension. There is no tenderness.  Musculoskeletal: He exhibits no edema.  Neurological: He is alert and oriented to person, place, and time.  Skin: Skin is warm and dry.  Psychiatric: He has a normal mood and affect. His behavior is normal. Thought content normal.          Assessment & Plan:  Hematuria- obtain bmet to assess renal function and CBC to assess blood count.   Start  renally dosed cipro for empiric rx of UTI. Send urine for culture.  Plan follow up in 2 weeks. If persistent hematuria at that time will need CT to further assess and possible urology referral.

## 2014-12-28 NOTE — Patient Instructions (Signed)
Please start cipro for possible urinary tract infection.  Call if you develop bright red blood in urine, low back pain or fever.   Follow up in 2 weeks.

## 2014-12-28 NOTE — Progress Notes (Signed)
Pre visit review using our clinic review tool, if applicable. No additional management support is needed unless otherwise documented below in the visit note. 

## 2014-12-29 LAB — CBC WITH DIFFERENTIAL/PLATELET
BASOS ABS: 0.1 10*3/uL (ref 0.0–0.1)
Basophils Relative: 0.7 % (ref 0.0–3.0)
Eosinophils Absolute: 0.2 10*3/uL (ref 0.0–0.7)
Eosinophils Relative: 2.6 % (ref 0.0–5.0)
HEMATOCRIT: 49.8 % (ref 39.0–52.0)
Hemoglobin: 16.4 g/dL (ref 13.0–17.0)
LYMPHS PCT: 39.4 % (ref 12.0–46.0)
Lymphs Abs: 3.6 10*3/uL (ref 0.7–4.0)
MCHC: 32.9 g/dL (ref 30.0–36.0)
MCV: 88.8 fl (ref 78.0–100.0)
MONOS PCT: 5.4 % (ref 3.0–12.0)
Monocytes Absolute: 0.5 10*3/uL (ref 0.1–1.0)
NEUTROS ABS: 4.8 10*3/uL (ref 1.4–7.7)
Neutrophils Relative %: 51.9 % (ref 43.0–77.0)
Platelets: 263 10*3/uL (ref 150.0–400.0)
RBC: 5.61 Mil/uL (ref 4.22–5.81)
RDW: 13.1 % (ref 11.5–15.5)
WBC: 9.3 10*3/uL (ref 4.0–10.5)

## 2014-12-29 LAB — BASIC METABOLIC PANEL
BUN: 16 mg/dL (ref 6–23)
CALCIUM: 9.4 mg/dL (ref 8.4–10.5)
CHLORIDE: 100 meq/L (ref 96–112)
CO2: 31 meq/L (ref 19–32)
Creatinine, Ser: 1.68 mg/dL — ABNORMAL HIGH (ref 0.40–1.50)
GFR: 58.42 mL/min — ABNORMAL LOW (ref >60.00)
Glucose, Bld: 82 mg/dL (ref 70–99)
POTASSIUM: 4 meq/L (ref 3.5–5.1)
SODIUM: 138 meq/L (ref 135–145)

## 2014-12-30 ENCOUNTER — Telehealth: Payer: Self-pay | Admitting: Internal Medicine

## 2014-12-30 LAB — URINE CULTURE: Colony Count: 10000

## 2014-12-30 NOTE — Telephone Encounter (Signed)
Caller name: Garreth   Relationship to patient: Self   Can be reached: [phone]  Pharmacy:  Reason for call: pt called in to see if his lab results are in. Please call back

## 2014-12-30 NOTE — Telephone Encounter (Signed)
Kidney function is unchanged. Blood count is normal.  Urine culture is pending.

## 2014-12-30 NOTE — Telephone Encounter (Signed)
Notified pt. 

## 2015-01-03 ENCOUNTER — Encounter: Payer: Self-pay | Admitting: Family

## 2015-01-08 NOTE — Telephone Encounter (Signed)
Pt called and results were discussed with pt as above.

## 2015-01-12 ENCOUNTER — Ambulatory Visit: Payer: BLUE CROSS/BLUE SHIELD | Admitting: Family

## 2015-02-06 ENCOUNTER — Other Ambulatory Visit: Payer: Self-pay | Admitting: Internal Medicine

## 2015-02-22 ENCOUNTER — Ambulatory Visit (INDEPENDENT_AMBULATORY_CARE_PROVIDER_SITE_OTHER): Payer: BLUE CROSS/BLUE SHIELD | Admitting: Family

## 2015-02-22 ENCOUNTER — Encounter: Payer: Self-pay | Admitting: Family

## 2015-02-22 VITALS — BP 132/88 | HR 84 | Temp 98.8°F | Resp 18 | Ht 72.0 in | Wt 248.2 lb

## 2015-02-22 DIAGNOSIS — J029 Acute pharyngitis, unspecified: Secondary | ICD-10-CM

## 2015-02-22 DIAGNOSIS — J020 Streptococcal pharyngitis: Secondary | ICD-10-CM | POA: Diagnosis not present

## 2015-02-22 LAB — POCT RAPID STREP A (OFFICE): RAPID STREP A SCREEN: POSITIVE — AB

## 2015-02-22 MED ORDER — AMOXICILLIN 500 MG PO CAPS: 500.0000 mg | ORAL_CAPSULE | Freq: Two times a day (BID) | ORAL | Status: DC

## 2015-02-22 NOTE — Progress Notes (Signed)
Pre visit review using our clinic review tool, if applicable. No additional management support is needed unless otherwise documented below in the visit note. 

## 2015-02-22 NOTE — Patient Instructions (Addendum)
Start amoxicillin for strep. Call if symptoms worsen or if symptoms are not improved in 2-3 days.

## 2015-02-22 NOTE — Progress Notes (Signed)
Subjective:    Patient ID: Erik Weber, male    DOB: Jun 14, 1975, 40 y.o.   MRN: [id number]  HPI  Erik Weber is a 40 yr old male who presents today with chief complaint of cough.  Cough began 3 days ago.  Has associated chest congestion which is worsening. 4 days ago developed a dry throat.  Also has some associated nasal congestion.  He denies associated fever. Tried alka selzer cold/flu and tylenol cold/flu with temporary relief of symptoms.    Review of Systems    see HPI  Past Medical History  Diagnosis Date  . Childhood asthma   . Positive TB test   . Hypertension   . Chronic renal insufficiency     Social History   Social History  . Marital Status: Single    Spouse Name: N/A  . Number of Children: N/A  . Years of Education: N/A   Occupational History  . Not on file.   Social History Main Topics  . Smoking status: Never Smoker   . Smokeless tobacco: Never Used  . Alcohol Use: No  . Drug Use: No  . Sexual Activity:    Partners: Male   Other Topics Concern  . Not on file   Social History Narrative   Occupation: Works for lab   Single    Adopted daughter - 4   Moved from [place] 4 yrs ago.   Never Smoked   Alcohol use-no   Caffeine use/day:  None   Does Patient Exercise:  yes             Past Surgical History  Procedure Laterality Date  . Tonsillectomy      1980's    Family History  Problem Relation Age of Onset  . Kidney cancer Mother 88  . Hypertension Father     age 30  . Other      no colon, no prostate cancer  . Lung cancer      unlce    No Known Allergies  Current Outpatient Prescriptions on File Prior to Visit  Medication Sig Dispense Refill  . amLODipine (NORVASC) 5 MG tablet TAKE ONE TABLET BY MOUTH ONCE DAILY 90 tablet 0  . olmesartan-hydrochlorothiazide (BENICAR HCT) 20-12.5 MG tablet TAKE ONE TABLET BY MOUTH ONCE DAILY 90 tablet 0  . phentermine (ADIPEX-P) 37.5 MG tablet Take 1 tablet by mouth daily.  5  . TRUVADA  200-300 MG tablet Take 1 tablet by mouth daily.  5   No current facility-administered medications on file prior to visit.    BP 132/88 mmHg  Pulse 84  Temp(Src) 98.8 F (37.1 C) (Oral)  Resp 18  Ht 6' (1.829 m)  Wt 248 lb 3.2 oz (112.583 kg)  BMI 33.65 kg/m2  SpO2 100%    Objective:   Physical Exam  Constitutional: He is oriented to person, place, and time. He appears well-developed and well-nourished. No distress.  HENT:  Head: Normocephalic and atraumatic.  Right Ear: Tympanic membrane and ear canal normal.  Left Ear: Tympanic membrane and ear canal normal.  Mouth/Throat: Posterior oropharyngeal erythema present. No oropharyngeal exudate or posterior oropharyngeal edema.  Cardiovascular: Normal rate and regular rhythm.   No murmur heard. Pulmonary/Chest: Effort normal and breath sounds normal. No respiratory distress. He has no wheezes. He has no rales.  Musculoskeletal: He exhibits no edema.  Lymphadenopathy:    He has no cervical adenopathy.  Neurological: He is alert and oriented to person, place, and time.  Skin:  Skin is warm and dry.  Psychiatric: He has a normal mood and affect. His behavior is normal. Thought content normal.          Assessment & Plan:  Strep pharyngitis- Rapid strep is +. Advised pt to begin amoxicillin, call if symptoms worsen or if symptoms are not improved in 2-3 days.

## 2015-04-02 ENCOUNTER — Ambulatory Visit (INDEPENDENT_AMBULATORY_CARE_PROVIDER_SITE_OTHER): Payer: BLUE CROSS/BLUE SHIELD | Admitting: Medical

## 2015-04-02 ENCOUNTER — Encounter: Payer: Self-pay | Admitting: Medical

## 2015-04-02 VITALS — BP 128/84 | HR 70 | Temp 98.1°F | Ht 72.0 in | Wt 250.6 lb

## 2015-04-02 DIAGNOSIS — J029 Acute pharyngitis, unspecified: Secondary | ICD-10-CM | POA: Diagnosis not present

## 2015-04-02 DIAGNOSIS — R05 Cough: Secondary | ICD-10-CM | POA: Diagnosis not present

## 2015-04-02 DIAGNOSIS — H6693 Otitis media, unspecified, bilateral: Secondary | ICD-10-CM

## 2015-04-02 DIAGNOSIS — R059 Cough, unspecified: Secondary | ICD-10-CM

## 2015-04-02 DIAGNOSIS — R062 Wheezing: Secondary | ICD-10-CM | POA: Diagnosis not present

## 2015-04-02 LAB — POCT RAPID STREP A (OFFICE): Rapid Strep A Screen: POSITIVE — AB

## 2015-04-02 MED ORDER — BECLOMETHASONE DIPROPIONATE 40 MCG/ACT IN AERS: 2.0000 | INHALATION_SPRAY | Freq: Two times a day (BID) | RESPIRATORY_TRACT | Status: DC

## 2015-04-02 MED ORDER — AZELASTINE HCL 0.1 % NA SOLN: 2.0000 | Freq: Two times a day (BID) | NASAL | Status: DC

## 2015-04-02 MED ORDER — AMOXICILLIN-POT CLAVULANATE 875-125 MG PO TABS: 1.0000 | ORAL_TABLET | Freq: Two times a day (BID) | ORAL | Status: DC

## 2015-04-02 NOTE — Progress Notes (Signed)
Subjective:    Patient ID: Erik Weber, male    DOB: 05-28-1975, 40 y.o.   MRN: [id number]  HPI   Pt states over past 1-2 months. He states some nasal congestion and chest congestion over past 2 months.   Pt states had strep in January.He did not have much symptoms. Pt took amoxicillin then.  No fever, no chills or sweats.  Pt is bringing up mucous when he coughs.   Pt not reporting sinus pressure.  Pt has been sneezing some over last month.  Pt has been wheezing. He states history of asthma but no had problems for years but then use it some recently over past week. Describes mild wheezing.     Review of Systems  Constitutional: Negative for fever, chills and fatigue.  HENT: Positive for congestion and sneezing. Negative for ear pain, hearing loss, postnasal drip, sinus pressure, sore throat and trouble swallowing.   Respiratory: Positive for cough and wheezing.   Cardiovascular: Negative for chest pain and palpitations.  Gastrointestinal: Negative for abdominal pain.  Musculoskeletal: Negative for back pain.  Neurological: Negative for dizziness, numbness and headaches.  Hematological: Negative for adenopathy. Does not bruise/bleed easily.  Psychiatric/Behavioral: Negative for behavioral problems and confusion.    Past Medical History  Diagnosis Date  . Childhood asthma   . Positive TB test   . Hypertension   . Chronic renal insufficiency     Social History   Social History  . Marital Status: Single    Spouse Name: N/A  . Number of Children: N/A  . Years of Education: N/A   Occupational History  . Not on file.   Social History Main Topics  . Smoking status: Never Smoker   . Smokeless tobacco: Never Used  . Alcohol Use: No  . Drug Use: No  . Sexual Activity:    Partners: Male   Other Topics Concern  . Not on file   Social History Narrative   Occupation: Works for lab   Single    Adopted daughter - 4   Moved from [place] 4 yrs ago.   Never  Smoked   Alcohol use-no   Caffeine use/day:  None   Does Patient Exercise:  yes             Past Surgical History  Procedure Laterality Date  . Tonsillectomy      1980's    Family History  Problem Relation Age of Onset  . Kidney cancer Mother 31  . Hypertension Father     age 2  . Other      no colon, no prostate cancer  . Lung cancer      unlce    No Known Allergies  Current Outpatient Prescriptions on File Prior to Visit  Medication Sig Dispense Refill  . amLODipine (NORVASC) 5 MG tablet TAKE ONE TABLET BY MOUTH ONCE DAILY 90 tablet 0  . olmesartan-hydrochlorothiazide (BENICAR HCT) 20-12.5 MG tablet TAKE ONE TABLET BY MOUTH ONCE DAILY 90 tablet 0  . phentermine (ADIPEX-P) 37.5 MG tablet Take 1 tablet by mouth daily.  5  . TRUVADA 200-300 MG tablet Take 1 tablet by mouth daily.  5   No current facility-administered medications on file prior to visit.    BP 128/84 mmHg  Pulse 70  Temp(Src) 98.1 F (36.7 C) (Oral)  Ht 6' (1.829 m)  Wt 250 lb 9.6 oz (113.671 kg)  BMI 33.98 kg/m2  SpO2 98%       Objective:  Physical Exam  General  Mental Status - Alert. General Appearance - Well groomed. Not in acute distress.  Skin Rashes- No Rashes.  HEENT Head- Normal. Ear Auditory Canal - Left- Normal. Right - Normal.Tympanic Membrane- Left- bright red. Right- bright red Eye Sclera/Conjunctiva- Left- Normal. Right- Normal. Nose & Sinuses Nasal Mucosa- Left-  Boggy and Congested. Right-  Boggy and  Congested.Bilateral no  maxillary and no  frontal sinus pressure. Mouth & Throat Lips: Upper Lip- Normal: no dryness, cracking, pallor, cyanosis, or vesicular eruption. Lower Lip-Normal: no dryness, cracking, pallor, cyanosis or vesicular eruption. Buccal Mucosa- Bilateral- No Aphthous ulcers. Oropharynx- No Discharge or Erythema. Tonsils: Characteristics- Bilateral-  Erythema and Congestion. Size/Enlargement- Bilateral- No enlargement. Discharge-  bilateral-None.  Neck Neck- Supple. No Masses.   Chest and Lung Exam Auscultation: Breath Sounds:-Clear even and unlabored.  Cardiovascular Auscultation:Rythm- Regular, rate and rhythm. Murmurs & Other Heart Sounds:Ausculatation of the heart reveal- No Murmurs.  Lymphatic Head & Neck General Head & Neck Lymphatics: Bilateral: Description- No Localized lymphadenopathy.       Assessment & Plan:  You appear to have ear infections on both sides. Will rx augmentin.  You had appearance of red pharynx on exam. Augmentin should treat this as well.(Rapid test done today since last month was + and you did not have symptoms.) Today tes + again.  For chronic intermittent nasal congestion. I think you may have allergic rhinitis vs vasomotor rhinitis. Will rx astelin.  For recent wheezing and hx of asthma. Will rx qvar in addition to your albuterol.  Follow up in 7 days any persisting symptoms or as needed.  Offered hiv test today since he is at some risk by hx. He states will get test done with Dr. Shawna Orleans test week.  Also explained to him he may be coming into contact with strep carrier.

## 2015-04-02 NOTE — Progress Notes (Signed)
Pre visit review using our clinic review tool, if applicable. No additional management support is needed unless otherwise documented below in the visit note. 

## 2015-04-02 NOTE — Patient Instructions (Addendum)
You appear to have ear infections on both sides. Will rx augmentin.  You had appearance of red pharynx on exam. Augmentin should treat this as well.(Rapid test done today since last month was + and you did not have symptoms.) Today test + again.  For chronic intermittent nasal congestion. I think you may have allergic rhinitis vs vasomotor rhinitis. Will rx astelin.  For recent wheezing and hx of asthma. Will rx qvar in addition to your albuterol.  Follow up in 7 days any persisting symptoms or as needed

## 2015-05-17 ENCOUNTER — Other Ambulatory Visit: Payer: Self-pay | Admitting: Internal Medicine

## 2015-06-11 ENCOUNTER — Ambulatory Visit (INDEPENDENT_AMBULATORY_CARE_PROVIDER_SITE_OTHER): Payer: BLUE CROSS/BLUE SHIELD | Admitting: Internal Medicine

## 2015-06-11 ENCOUNTER — Encounter: Payer: Self-pay | Admitting: Internal Medicine

## 2015-06-11 VITALS — BP 110/60 | HR 101 | Temp 97.5°F | Ht 72.0 in | Wt 250.0 lb

## 2015-06-11 DIAGNOSIS — R300 Dysuria: Secondary | ICD-10-CM

## 2015-06-11 LAB — POC URINALSYSI DIPSTICK (AUTOMATED)
BILIRUBIN UA: NEGATIVE
Glucose, UA: NEGATIVE
Ketones, UA: NEGATIVE
LEUKOCYTES UA: NEGATIVE
NITRITE UA: NEGATIVE
PH UA: 6
PROTEIN UA: NEGATIVE
RBC UA: NEGATIVE
Spec Grav, UA: 1.025
UROBILINOGEN UA: 0.2

## 2015-06-11 NOTE — Progress Notes (Signed)
Pre visit review using our clinic review tool, if applicable. No additional management support is needed unless otherwise documented below in the visit note. 

## 2015-06-11 NOTE — Progress Notes (Signed)
Subjective:    Patient ID: Erik Weber, male    DOB: [birth year], 40 y.o.   MRN: [id number]  DOS:  06/11/2015 Type of visit - description : Acute visit Interval history: 2 weeks history of mild dysuria, no other associated symptoms. Last week took  4 tablets of a leftover ciprofloxacin, symptoms temporarily decreased.  The patient is in a stable relationship with a male, + anal intercourse. + Unprotected sex Patient is seronegative HIV.   Review of Systems Denies fever chills Denies penile discharge, genital rash. No hematuria, difficulty urinating. + Mild urinary frequency.   Past Medical History  Diagnosis Date  . Childhood asthma   . Positive TB test   . Hypertension   . Chronic renal insufficiency     Past Surgical History  Procedure Laterality Date  . Tonsillectomy      1980's    Social History   Social History  . Marital Status: Single    Spouse Name: N/A  . Number of Children: N/A  . Years of Education: N/A   Occupational History  . Not on file.   Social History Main Topics  . Smoking status: Never Smoker   . Smokeless tobacco: Never Used  . Alcohol Use: No  . Drug Use: No  . Sexual Activity:    Partners: Male   Other Topics Concern  . Not on file   Social History Narrative   Occupation: Works for lab   Single    Adopted daughter - 4   Moved from [place] 4 yrs ago.   Never Smoked   Alcohol use-no   Caffeine use/day:  None   Does Patient Exercise:  yes                 Medication List       This list is accurate as of: 06/11/15 11:59 PM.  Always use your most recent med list.               amLODipine 5 MG tablet  Commonly known as:  NORVASC  TAKE ONE TABLET BY MOUTH ONCE DAILY     olmesartan-hydrochlorothiazide 20-12.5 MG tablet  Commonly known as:  BENICAR HCT  TAKE ONE TABLET BY MOUTH ONCE DAILY     phentermine 37.5 MG tablet  Commonly known as:  ADIPEX-P  Take 1 tablet by mouth daily.     TRUVADA 200-300 MG tablet    Generic drug:  emtricitabine-tenofovir  Take 1 tablet by mouth daily.           Objective:   Physical Exam BP 110/60 mmHg  Pulse 101  Temp(Src) 97.5 F (36.4 C) (Oral)  Ht 6' (1.829 m)  Wt 250 lb (113.399 kg)  BMI 33.90 kg/m2  SpO2 98% General:   Well developed, well nourished . NAD.  HEENT:  Normocephalic . Face symmetric, atraumatic Lungs:  CTA B Normal respiratory effort, no intercostal retractions, no accessory muscle use. Heart: RRR,  no murmur.  no pretibial edema bilaterally  Abdomen:  Not distended, soft, non-tender. No rebound or rigidity.  GU: Normal scrotal contents, penis including the urinary meatus normal without rash, ulcers. Groins without LAD is. Skin: Not pale. Not jaundice Neurologic:  alert & oriented X3.  Speech normal, gait appropriate for age and unassisted Psych--  Cognition and judgment appear intact.  Cooperative with normal attention span and concentration.  Behavior appropriate. No anxious or depressed appearing.    Assessment & Plan:   Dysuria: 40 year old gentleman, in a stable  relationship with another male, + unprotected sex presents with dysuria. He did take 4 tablets of Cipro. udip (-) Plan:  UA, urine culture, G& C, HIV, RPR. If all results are negative and he continue with sx consider further antibiotics (?partially treated UTI) Safe sex discussed, also avoid self started ABX in the future.

## 2015-06-11 NOTE — Patient Instructions (Signed)
GO TO THE LAB :      Get the blood work    Call if symptoms persist  Drink plenty of fluids   Safe Sex Safe sex is about reducing the risk of giving or getting a sexually transmitted disease (STD). STDs are spread through sexual contact involving the genitals, mouth, or rectum. Some STDs can be cured and others cannot. Safe sex can also prevent unintended pregnancies.  WHAT ARE SOME SAFE SEX PRACTICES?  Limit your sexual activity to only one partner who is having sex with only you.  Talk to your partner about his or her past partners, past STDs, and drug use.  Use a condom every time you have sexual intercourse. This includes vaginal, oral, and anal sexual activity. Both females and males should wear condoms during oral sex. Only use latex or polyurethane condoms and water-based lubricants. Using petroleum-based lubricants or oils to lubricate a condom will weaken the condom and increase the chance that it will break. The condom should be in place from the beginning to the end of sexual activity. Wearing a condom reduces, but does not completely eliminate, your risk of getting or giving an STD. STDs can be spread by contact with infected body fluids and skin.  Get vaccinated for hepatitis B and HPV.  Avoid alcohol and recreational drugs, which can affect your judgment. You may forget to use a condom or participate in high-risk sex.  For females, avoid douching after sexual intercourse. Douching can spread an infection farther into the reproductive tract.  Check your body for signs of sores, blisters, rashes, or unusual discharge. See your health care provider if you notice any of these signs.  Avoid sexual contact if you have symptoms of an infection or are being treated for an STD. If you or your partner has herpes, avoid sexual contact when blisters are present. Use condoms at all other times.  If you are at risk of being infected with HIV, it is recommended that you take a prescription  medicine daily to prevent HIV infection. This is called pre-exposure prophylaxis (PrEP). You are considered at risk if:  You are a man who has sex with other men (MSM).  You are a heterosexual man or woman who is sexually active with more than one partner.  You take drugs by injection.  You are sexually active with a partner who has HIV.  Talk with your health care provider about whether you are at high risk of being infected with HIV. If you choose to begin PrEP, you should first be tested for HIV. You should then be tested every 3 months for as long as you are taking PrEP.  See your health care provider for regular screenings, exams, and tests for other STDs. Before having sex with a new partner, each of you should be screened for STDs and should talk about the results with each other. WHAT ARE THE BENEFITS OF SAFE SEX?   There is less chance of getting or giving an STD.  You can prevent unwanted or unintended pregnancies.  By discussing safe sex concerns with your partner, you may increase feelings of intimacy, comfort, trust, and honesty between the two of you.   This information is not intended to replace advice given to you by your health care provider. Make sure you discuss any questions you have with your health care provider.   Document Released: 03/02/2004 Document Revised: 02/13/2014 Document Reviewed: 07/17/2011 Elsevier Interactive Patient Education [employer].

## 2015-06-12 LAB — URINALYSIS, ROUTINE W REFLEX MICROSCOPIC
BILIRUBIN URINE: NEGATIVE
GLUCOSE, UA: NEGATIVE
HGB URINE DIPSTICK: NEGATIVE
KETONES UR: NEGATIVE
Nitrite: NEGATIVE
PH: 6 (ref 5.0–8.0)
PROTEIN: NEGATIVE
Specific Gravity, Urine: 1.012 (ref 1.001–1.035)

## 2015-06-12 LAB — URINALYSIS, MICROSCOPIC ONLY
BACTERIA UA: NONE SEEN [HPF]
CASTS: NONE SEEN [LPF]
Crystals: NONE SEEN [HPF]
RBC / HPF: NONE SEEN RBC/HPF (ref <=2)
YEAST: NONE SEEN [HPF]

## 2015-06-12 LAB — URINE CULTURE: Colony Count: >=100000

## 2015-06-12 LAB — HIV ANTIBODY (ROUTINE TESTING W REFLEX): HIV 1&2 Ab, 4th Generation: NONREACTIVE

## 2015-06-12 LAB — GC/CHLAMYDIA PROBE AMP
CT Probe RNA: NOT DETECTED
GC Probe RNA: NOT DETECTED

## 2015-06-14 LAB — RPR

## 2015-06-16 ENCOUNTER — Telehealth: Payer: Self-pay | Admitting: Internal Medicine

## 2015-06-16 ENCOUNTER — Ambulatory Visit (INDEPENDENT_AMBULATORY_CARE_PROVIDER_SITE_OTHER): Payer: BLUE CROSS/BLUE SHIELD | Admitting: Family Medicine

## 2015-06-16 ENCOUNTER — Ambulatory Visit (HOSPITAL_BASED_OUTPATIENT_CLINIC_OR_DEPARTMENT_OTHER)
Admission: RE | Admit: 2015-06-16 | Discharge: 2015-06-16 | Disposition: A | Discharge status: Home / Self Care | Payer: BLUE CROSS/BLUE SHIELD | Source: Ambulatory Visit | Attending: Family Medicine | Admitting: Family Medicine

## 2015-06-16 ENCOUNTER — Encounter: Payer: Self-pay | Admitting: Family Medicine

## 2015-06-16 VITALS — BP 126/87 | HR 97 | Temp 98.6°F | Ht 73.0 in | Wt 246.6 lb

## 2015-06-16 DIAGNOSIS — R195 Other fecal abnormalities: Secondary | ICD-10-CM

## 2015-06-16 DIAGNOSIS — D72829 Elevated white blood cell count, unspecified: Secondary | ICD-10-CM | POA: Diagnosis not present

## 2015-06-16 DIAGNOSIS — R10811 Right upper quadrant abdominal tenderness: Secondary | ICD-10-CM | POA: Insufficient documentation

## 2015-06-16 DIAGNOSIS — R1013 Epigastric pain: Secondary | ICD-10-CM

## 2015-06-16 LAB — CBC
HEMATOCRIT: 46.9 % (ref 38.5–50.0)
Hemoglobin: 16.1 g/dL (ref 13.2–17.1)
MCH: 29.8 pg (ref 27.0–33.0)
MCHC: 34.3 g/dL (ref 32.0–36.0)
MCV: 86.7 fL (ref 80.0–100.0)
MPV: 10.5 fL (ref 7.5–12.5)
PLATELETS: 279 10*3/uL (ref 140–400)
RBC: 5.41 MIL/uL (ref 4.20–5.80)
RDW: 13.1 % (ref 11.0–15.0)
WBC: 11.6 10*3/uL — AB (ref 3.8–10.8)

## 2015-06-16 LAB — IFOBT (OCCULT BLOOD): IFOBT: NEGATIVE

## 2015-06-16 MED ORDER — SUCRALFATE 1 G PO TABS: 1.0000 g | ORAL_TABLET | Freq: Three times a day (TID) | ORAL | Status: DC

## 2015-06-16 MED ORDER — GI COCKTAIL ~~LOC~~: 30.0000 mL | Freq: Once | ORAL | Status: DC

## 2015-06-16 MED ORDER — PANTOPRAZOLE SODIUM 40 MG PO TBEC: 40.0000 mg | DELAYED_RELEASE_TABLET | Freq: Every day | ORAL | Status: DC

## 2015-06-16 NOTE — Telephone Encounter (Signed)
Spoke with patient per Dr. Lorelei Pont patient will need to have labs drawn before he leaves. Given GI cocktail per Dr. Lorelei Pont.

## 2015-06-16 NOTE — Progress Notes (Addendum)
[place] at [facility] [address], [place], [place] [address] [phone]  Date:  06/16/2015   Name:  Erik Weber   DOB:  06/08/1975   MRN:  [id number]  PCP:  Drema Pry, DO    Chief Complaint: Abdominal Pain   History of Present Illness:  Erik Weber is a 40 y.o. very pleasant male patient who presents with the following:  He was here this past Friday (today is Wednesday) with concern about a kidney stone. He noted dysuria, work up negative. On Sunday he noted onset of pain in the right side of his belly. The pain will come and go like "spasms."   No diarrhea. No vomiting He has noted that his stools are darker than usual.   He has not seen any blood in his stool He may have some dysuria/burning off and on but this is improved He had a urine culture on 5/5 which was negative. He is eating a little but not much.  Nothing to eat so far today, has not felt like eating  No fever, aches or flu like sx Never had a kidney stone in the past Never had any abd surgery  He takes truvada for prophylaxis but he does not have HIV  Patient Active Problem List   Diagnosis Date Noted  . Encounter for HIV counseling 11/28/2013  . Obesity 08/18/2013  . Tinea versicolor 05/19/2013  . Preventative health care 05/19/2013  . Hypertriglyceridemia 05/28/2012  . Hypogonadism male 06/07/2011  . General medical examination 08/05/2010  . Essential hypertension 09/21/2009  . 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 GERD 04/04/2007    Past Medical History  Diagnosis Date  . Childhood asthma   . Positive TB test   . Hypertension   . Chronic renal insufficiency     Past Surgical History  Procedure Laterality Date  . Tonsillectomy      19 80's    Social History  Substance Use Topics  . Smoking status: Never Smoker   . Smokeless tobacco: Never Used  . Alcohol Use: No    Family History  Problem Relation  Age of Onset  . Kidney cancer Mother 37  . Hypertension Father     age 27  . Other      no colon, no prostate cancer  . Lung cancer      unlce    No Known Allergies  Medication list has been reviewed and updated.  Current Outpatient Prescriptions on File Prior to Visit  Medication Sig Dispense Refill  . amLODipine (NORVASC) 5 MG tablet TAKE ONE TABLET BY MOUTH ONCE DAILY 90 tablet 0  . olmesartan-hydrochlorothiazide (BENICAR HCT) 20-12.5 MG tablet TAKE ONE TABLET BY MOUTH ONCE DAILY 90 tablet 0  . phentermine (ADIPEX-P) 37.5 MG tablet Take 1 tablet by mouth daily.  5  . TRUVADA 200-300 MG tablet Take 1 tablet by mouth daily.  5   No current facility-administered medications on file prior to visit.    Review of Systems:  As per HPI- otherwise negative.   Physical Examination: Filed Vitals:   06/16/15 1337  BP: 126/87  Pulse: 97  Temp: 98.6 F (37 C)   Filed Vitals:   06/16/15 1337  Height: 6' 1"  (1.854 m)  Weight: 246 lb 9.6 oz (111.857 kg)   Body mass index is 32.54 kg/(m^2). Ideal Body Weight: Weight in (lb) to have BMI = 25: 189.1  GEN: WDWN, NAD,  Non-toxic, A & O x 3, looks well HEENT: Atraumatic, Normocephalic. Neck supple. No masses, No LAD. Ears and Nose: No external deformity. CV: RRR, No M/G/R. No JVD. No thrill. No extra heart sounds. PULM: CTA B, no wheezes, crackles, rhonchi. No retractions. No resp. distress. No accessory muscle use. ABD: S, ND, +BS. No rebound. No HSM.  Mild tenderness over entire belly. Worse in the RUQ EXTR: No c/c/e NEURO Normal gait.  PSYCH: Normally interactive. Conversant. Not depressed or anxious appearing.  Calm demeanor.   Results for orders placed or performed in visit on 06/16/15  CBC  Result Value Ref Range   WBC 11.6 (H) 3.8 - 10.8 K/uL   RBC 5.41 4.20 - 5.80 MIL/uL   Hemoglobin 16.1 13.2 - 17.1 g/dL   HCT 46.9 38.5 - 50.0 %   MCV 86.7 80.0 - 100.0 fL   MCH 29.8 27.0 - 33.0 pg   MCHC 34.3 32.0 - 36.0 g/dL    RDW 13.1 11.0 - 15.0 %   Platelets 279 140 - 400 K/uL   MPV 10.5 7.5 - 12.5 fL  IFOBT POC (occult bld, rslt in office)  Result Value Ref Range   IFOBT Negative      US Abdomen Complete  06/16/2015  CLINICAL DATA:  Acute right upper quadrant abdominal pain. EXAM: ABDOMEN ULTRASOUND COMPLETE COMPARISON:  Ultrasound of May 09, 2007. CT scan of October 22, 2012. FINDINGS: Gallbladder: No gallstones or wall thickening visualized. No sonographic Murphy sign noted by sonographer. Common bile duct: Diameter: 1.9 mm which is within normal limits. Liver: No focal lesion identified. Within normal limits in parenchymal echogenicity. IVC: No abnormality visualized. Pancreas: Not visualized due to overlying bowel gas. Spleen: Not visualized due to overlying bowel gas. Right Kidney: Length: 10.4 cm. Echogenicity within normal limits. No mass or hydronephrosis visualized. Left Kidney: Length: 11.5 cm. Echogenicity within normal limits. No mass or hydronephrosis visualized. Abdominal aorta: No aneurysm visualized. Other findings: None. IMPRESSION: Pancreas and spleen not well visualized due to overlying bowel gas. No other definite abnormality seen in the abdomen. Electronically Signed   By: Marijo Conception, M.D.   On: 06/16/2015 16:00    Assessment and Plan: RUQ abdominal tenderness - Plan: US Abdomen Complete, Comprehensive metabolic panel, Lipase, gi cocktail (Maalox,Lidocaine,Donnatal), CBC, CANCELED: CBC  Dark stools - Plan: IFOBT POC (occult bld, rslt in office)  Abdominal pain, epigastric - Plan: pantoprazole (PROTONIX) 40 MG tablet, sucralfate (CARAFATE) 1 g tablet  Here today with belly pain Exam most suggestive of gallbladder pathology however [place] today is negative. Had pt come back up to clinic after his [place].  Gave a GI cocktail which gave him significant relief.  Following GI cocktail he notes mild tenderness in the epigastrium but the rest of his belly was no longer tender Will check labs and also  have him start on carafate and prilosec. Close follow-up to make sure his sx are remitting.  At this time do not suspect a more dangerous pathology such as appendicitis but asked pt to go to ER if he has worsening sx, he agrees   Signed Lamar Blinks, MD  Called to check on him on 5/11 am- he reports that he still has pain.  Will set up a CT for him today.  He is ok with this plan and I will arrange for him asap

## 2015-06-16 NOTE — Progress Notes (Signed)
Pre visit review using our clinic tool,if applicable. No additional management support is needed unless otherwise documented below in the visit note.  

## 2015-06-16 NOTE — Telephone Encounter (Signed)
Jenna from imaging called to give results to Dr. Lorelei Pont.   She says that everything is normal . She advised pt to go home and that someone will follow up with him.

## 2015-06-16 NOTE — Patient Instructions (Addendum)
You have an ultrasound appt downstairs at 3:30. Don't eat or drink anything until then!  After your [place] come back upstairs to my office  We will get labs for you today and I'll be in touch asap For the time being start the carafate (4x daily before meals and before bed, for 7- 10 days) and also protonix once a day for the next 2-4 weeks   If you have any severe or worsening pain- go to the ER!

## 2015-06-17 ENCOUNTER — Encounter: Payer: Self-pay | Admitting: Family Medicine

## 2015-06-17 ENCOUNTER — Ambulatory Visit (HOSPITAL_BASED_OUTPATIENT_CLINIC_OR_DEPARTMENT_OTHER)
Admission: RE | Admit: 2015-06-17 | Discharge: 2015-06-17 | Disposition: A | Discharge status: Home / Self Care | Payer: BLUE CROSS/BLUE SHIELD | Source: Ambulatory Visit | Attending: Family Medicine | Admitting: Family Medicine

## 2015-06-17 ENCOUNTER — Telehealth: Payer: Self-pay | Admitting: Family Medicine

## 2015-06-17 ENCOUNTER — Ambulatory Visit (INDEPENDENT_AMBULATORY_CARE_PROVIDER_SITE_OTHER): Payer: BLUE CROSS/BLUE SHIELD | Admitting: Family Medicine

## 2015-06-17 ENCOUNTER — Encounter (HOSPITAL_BASED_OUTPATIENT_CLINIC_OR_DEPARTMENT_OTHER): Payer: Self-pay

## 2015-06-17 VITALS — BP 118/80 | HR 102 | Temp 99.3°F

## 2015-06-17 DIAGNOSIS — D72829 Elevated white blood cell count, unspecified: Secondary | ICD-10-CM | POA: Diagnosis not present

## 2015-06-17 DIAGNOSIS — R10811 Right upper quadrant abdominal tenderness: Secondary | ICD-10-CM | POA: Insufficient documentation

## 2015-06-17 DIAGNOSIS — N10 Acute tubulo-interstitial nephritis: Secondary | ICD-10-CM | POA: Diagnosis not present

## 2015-06-17 DIAGNOSIS — N059 Unspecified nephritic syndrome with unspecified morphologic changes: Secondary | ICD-10-CM | POA: Diagnosis not present

## 2015-06-17 LAB — COMPREHENSIVE METABOLIC PANEL
ALT: 26 U/L (ref 0–53)
AST: 18 U/L (ref 0–37)
Albumin: 4.4 g/dL (ref 3.5–5.2)
Alkaline Phosphatase: 76 U/L (ref 39–117)
BUN: 13 mg/dL (ref 6–23)
CALCIUM: 9.5 mg/dL (ref 8.4–10.5)
CHLORIDE: 99 meq/L (ref 96–112)
CO2: 29 meq/L (ref 19–32)
Creatinine, Ser: 1.76 mg/dL — ABNORMAL HIGH (ref 0.40–1.50)
GFR: 55.24 mL/min — AB (ref >60.00)
Glucose, Bld: 91 mg/dL (ref 70–99)
Potassium: 4.4 mEq/L (ref 3.5–5.1)
Sodium: 137 mEq/L (ref 135–145)
Total Bilirubin: 0.8 mg/dL (ref 0.2–1.2)
Total Protein: 7.9 g/dL (ref 6.0–8.3)

## 2015-06-17 LAB — LIPASE: Lipase: 11 U/L (ref 11.0–59.0)

## 2015-06-17 MED ORDER — IOPAMIDOL (ISOVUE-300) INJECTION 61%
100.0000 mL | Freq: Once | INTRAVENOUS | Status: AC | PRN
  Administered 2015-06-17: 100 mL via INTRAVENOUS

## 2015-06-17 MED ORDER — CEFTRIAXONE SODIUM 1 G IJ SOLR
1.0000 g | Freq: Once | INTRAMUSCULAR | Status: AC
  Administered 2015-06-17: 1 g via INTRAMUSCULAR

## 2015-06-17 MED ORDER — LEVOFLOXACIN 750 MG PO TABS: 750.0000 mg | ORAL_TABLET | Freq: Every day | ORAL | Status: DC

## 2015-06-17 NOTE — Progress Notes (Signed)
[place] at [facility] [address], [place], [place] [address] [phone]  Date:  06/17/2015   Name:  Erik Weber   DOB:  February 23, 1975   MRN:  [id number]  PCP:  Drema Pry, DO    Chief Complaint: Follow-up   History of Present Illness:  Erik Weber is a 40 y.o. very pleasant male patient who presents with the following: Seen here twice over the last week with abd pain. Last week he had some mild dysuria. He had self- started on cipro, great mixed bacteria on his culture.  Yesterday had more right sided belly pain, thought to be most likely due to gallbladder pathology.  [place] negative so when sx continued today decided to do a CT scan  He does have a known history of mild renal insuf followed by nephrology.  As of now his only treatment is BP control Here today to follow-up on CT scan result -  IMPRESSION: 1. There is abnormal focal area of cortical decreased attenuation in lower pole anterior aspect of the right kidney measures 2.5 cm. This is highly suspicious for focal pyelonephritis. Additional tiny foci of decreased attenuation posterior aspect of the right kidney on delayed images see axial image 18 may represent additional foci of pyelonephritis. There is subtle mild enhancement of the proximal right ureteral wall. Right urinary tract inflammation/infection cannot be excluded. No hydronephrosis. Bilateral renal symmetrical excretion. 2. Moderate stool noted in right colon. No pericecal inflammation. Normal appendix. 3. No small bowel obstruction. 4. Under distended urinary bladder. Mild thickening of urinary bladder wall most likely due to underdistention rather than cystitis. 5. No ascites or free air. These results were called by telephone at the time of interpretation on 06/17/2015 at 1:20 pm to Dr. Lamar Blinks , who verbally acknowledged these results.  Discussed with pt- it appears that his sx are due to  pyelonephritis.  Will treat with IM rocephin and then PO levaquin.    Patient Active Problem List   Diagnosis Date Noted  . Encounter for HIV counseling 11/28/2013  . Obesity 08/18/2013  . Tinea versicolor 05/19/2013  . Preventative health care 05/19/2013  . Hypertriglyceridemia 05/28/2012  . Hypogonadism male 06/07/2011  . General medical examination 08/05/2010  . Essential hypertension 09/21/2009  . 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 GERD 04/04/2007    Past Medical History  Diagnosis Date  . Childhood asthma   . Positive TB test   . Hypertension   . Chronic renal insufficiency     Past Surgical History  Procedure Laterality Date  . Tonsillectomy      1980's    Social History  Substance Use Topics  . Smoking status: Never Smoker   . Smokeless tobacco: Never Used  . Alcohol Use: No    Family History  Problem Relation Age of Onset  . Kidney cancer Mother 73  . Hypertension Father     age 45  . Other      no colon, no prostate cancer  . Lung cancer      unlce    No Known Allergies  Medication list has been reviewed and updated.  Current Outpatient Prescriptions on File Prior to Visit  Medication Sig Dispense Refill  . amLODipine (NORVASC) 5 MG tablet TAKE ONE TABLET BY MOUTH ONCE DAILY 90 tablet 0  . olmesartan-hydrochlorothiazide (BENICAR HCT) 20-12.5 MG tablet TAKE ONE TABLET BY MOUTH ONCE DAILY 90 tablet 0  .  pantoprazole (PROTONIX) 40 MG tablet Take 1 tablet (40 mg total) by mouth daily. 30 tablet 0  . phentermine (ADIPEX-P) 37.5 MG tablet Take 1 tablet by mouth daily.  5  . sucralfate (CARAFATE) 1 g tablet Take 1 tablet (1 g total) by mouth 4 (four) times daily -  with meals and at bedtime. 40 tablet 0  . TRUVADA 200-300 MG tablet Take 1 tablet by mouth daily.  5   Current Facility-Administered Medications on File Prior to Visit  Medication Dose Route Frequency Provider Last Rate Last Dose  . gi cocktail  (Maalox,Lidocaine,Donnatal)  30 mL Oral Once Darreld Mclean, MD        Review of Systems:  As per HPI- otherwise negative.   Physical Examination: Filed Vitals:   06/17/15 1426  BP: 118/80  Pulse: 102  Temp: 99.3 F (37.4 C)   There were no vitals filed for this visit. There is no weight on file to calculate BMI. Ideal Body Weight:     GEN: WDWN, NAD, Non-toxic, Alert & Oriented x 3, looks well, afebrile HEENT: Atraumatic, Normocephalic.  Ears and Nose: No external deformity. EXTR: No clubbing/cyanosis/edema NEURO: Normal gait.  PSYCH: Normally interactive. Conversant. Not depressed or anxious appearing.  Calm demeanor.   Creat clearance is 57 Assessment and Plan: Acute pyelonephritis - Plan: levofloxacin (LEVAQUIN) 750 MG tablet  Type of kidney inflammation - Plan: cefTRIAXone (ROCEPHIN) injection 1 g  Treat for pyelonephritis with IM rocephin and levaquin. He will let me know if not improving steadily  Signed Lamar Blinks, MD

## 2015-06-17 NOTE — Progress Notes (Signed)
Pre visit review using our clinic tool,if applicable. No additional management support is needed unless otherwise documented below in the visit note.  

## 2015-06-17 NOTE — Patient Instructions (Signed)
We are doing to treat you with levaquin 750 once a day for 10 days.  Please let me know if you are not improving steadily over the next 2-3 days.

## 2015-06-17 NOTE — Telephone Encounter (Signed)
Can be reached: [phone]   Reason for call: Pt calling to check on stat lab results for WBC

## 2015-06-17 NOTE — Addendum Note (Signed)
Addended by: Lamar Blinks C on: 06/17/2015 11:52 AM   Modules accepted: Orders

## 2015-06-18 ENCOUNTER — Encounter: Payer: Self-pay | Admitting: Family Medicine

## 2015-06-24 ENCOUNTER — Encounter: Payer: Self-pay | Admitting: Family Medicine

## 2015-06-24 NOTE — Telephone Encounter (Signed)
Called to check on him and [facility]- let me know if not feeling better

## 2015-06-24 NOTE — Telephone Encounter (Signed)
Pt returning call. Best # to reach him is [phone].

## 2015-06-24 NOTE — Telephone Encounter (Signed)
Called him back- he is feeling "150% better."  He will see me in June to establish care

## 2015-07-14 ENCOUNTER — Telehealth: Payer: Self-pay | Admitting: *Deleted

## 2015-07-14 NOTE — Telephone Encounter (Signed)
Unable to reach patient at time of pre-visit call. Left message for patient to return call when available.

## 2015-07-15 ENCOUNTER — Ambulatory Visit: Payer: BLUE CROSS/BLUE SHIELD | Admitting: Family Medicine

## 2015-08-18 ENCOUNTER — Ambulatory Visit (INDEPENDENT_AMBULATORY_CARE_PROVIDER_SITE_OTHER): Payer: BLUE CROSS/BLUE SHIELD | Admitting: Family Medicine

## 2015-08-18 ENCOUNTER — Encounter: Payer: Self-pay | Admitting: Family Medicine

## 2015-08-18 VITALS — BP 115/72 | HR 89 | Temp 98.6°F | Ht 71.0 in | Wt 245.6 lb

## 2015-08-18 DIAGNOSIS — R300 Dysuria: Secondary | ICD-10-CM

## 2015-08-18 DIAGNOSIS — D72829 Elevated white blood cell count, unspecified: Secondary | ICD-10-CM

## 2015-08-18 DIAGNOSIS — R17 Unspecified jaundice: Secondary | ICD-10-CM

## 2015-08-18 DIAGNOSIS — N10 Acute tubulo-interstitial nephritis: Secondary | ICD-10-CM

## 2015-08-18 LAB — POC URINALSYSI DIPSTICK (AUTOMATED)
BILIRUBIN UA: NEGATIVE
Glucose, UA: NEGATIVE
Ketones, UA: NEGATIVE
LEUKOCYTES UA: NEGATIVE
NITRITE UA: NEGATIVE
PH UA: 6
Spec Grav, UA: 1.025
UROBILINOGEN UA: 2

## 2015-08-18 MED ORDER — CEFTRIAXONE SODIUM 1 G IJ SOLR
1.0000 g | Freq: Once | INTRAMUSCULAR | Status: AC
  Administered 2015-08-18: 1 g via INTRAMUSCULAR

## 2015-08-18 MED ORDER — LEVOFLOXACIN 750 MG PO TABS: 750.0000 mg | ORAL_TABLET | Freq: Every day | ORAL | Status: DC

## 2015-08-18 NOTE — Patient Instructions (Addendum)
You got a shot of rocephin and also an rx for oral levaquin (both antibiotics) for presumed kidney infection today I will be in touch with the rest of your labs asap Please have your blood drawn today.   Please get in touch with Dr. Florene Glen and let him know that we think you have another kidney infection.  He may want to evaluate this himself or have me refer you to urology. I am glad to make this referral if needed For the time being please take it easy, drink plenty of fluids and let me know if you are not getting better in the next couple of days- Sooner if worse.

## 2015-08-18 NOTE — Progress Notes (Addendum)
[place] at [facility] [address] [place], [place] [address] [phone]  Date:  08/18/2015   Name:  Erik Weber   DOB:  1975/11/02   MRN:  [id number]  PCP:  Drema Pry, DO    Chief Complaint: Dysuria   History of Present Illness:  Erik Weber is a 40 y.o. very pleasant male patient who presents with the following:  Seen in May with pyelonephritis; he had a somewhat unusual presentation which made the dx challenging, pyelo seen on CT scan and responded readily to treatment.    Yesterday am he awoke with body aches, then he noted chills. He noted dark colored urine, and dysuria towards the end of urination.  His penis felt "heavy."  The body aches are resolved, but he continued to notice intermittent dysuria.  This am he had chills again and felt really terrible. H was about to go to the ER, but then was able to get warm and felt better He has not measured any fever No rash, ST, or cough  He has noted some mid lower back pain.   He is able to pass urine ok He is eating normally He has noted a mild epigastric pain, much like he had last time  He has not noted any penile discharge or lesions  He did get complelty well in between his last pyelo and now He does not have HIV- takes truvada for prevention  He has CRI, noted for the last 7 years. This was caused by uncontrolled BP which is now under control. He sees Dr. Florene Glen; next appt is next year.     Patient Active Problem List   Diagnosis Date Noted  . Encounter for HIV counseling 11/28/2013  . Obesity 08/18/2013  . Tinea versicolor 05/19/2013  . Preventative health care 05/19/2013  . Hypertriglyceridemia 05/28/2012  . Hypogonadism male 06/07/2011  . General medical examination 08/05/2010  . Essential hypertension 09/21/2009  . 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 GERD 04/04/2007    Past Medical History  Diagnosis Date  . Childhood  asthma   . Positive TB test   . Hypertension   . Chronic renal insufficiency     Past Surgical History  Procedure Laterality Date  . Tonsillectomy      1980's    Social History  Substance Use Topics  . Smoking status: Never Smoker   . Smokeless tobacco: Never Used  . Alcohol Use: No    Family History  Problem Relation Age of Onset  . Kidney cancer Mother 9  . Hypertension Father     age 83  . Other      no colon, no prostate cancer  . Lung cancer      unlce    No Known Allergies  Medication list has been reviewed and updated.  Current Outpatient Prescriptions on File Prior to Visit  Medication Sig Dispense Refill  . amLODipine (NORVASC) 5 MG tablet TAKE ONE TABLET BY MOUTH ONCE DAILY 90 tablet 0  . olmesartan-hydrochlorothiazide (BENICAR HCT) 20-12.5 MG tablet TAKE ONE TABLET BY MOUTH ONCE DAILY 90 tablet 0  . phentermine (ADIPEX-P) 37.5 MG tablet Take 1 tablet by mouth daily.  5  . sucralfate (CARAFATE) 1 g tablet Take 1 tablet (1 g total) by mouth 4 (four) times daily -  with meals and at bedtime. 40 tablet 0  . TRUVADA 200-300 MG tablet Take 1 tablet by mouth daily.  5   Current Facility-Administered Medications on File Prior to Visit  Medication Dose Route Frequency Provider Last Rate Last Dose  . gi cocktail (Maalox,Lidocaine,Donnatal)  30 mL Oral Once Darreld Mclean, MD        Review of Systems:  As per HPI- otherwise negative.   Physical Examination: Filed Vitals:   08/18/15 1452  BP: 115/72  Pulse: 89  Temp: 98.6 F (37 C)   Filed Vitals:   08/18/15 1452  Height: 5' 11"  (1.803 m)  Weight: 245 lb 9.6 oz (111.403 kg)   Body mass index is 34.27 kg/(m^2). Ideal Body Weight: Weight in (lb) to have BMI = 25: 178.9  GEN: WDWN, NAD, Non-toxic, A & O x 3, overweight, looks well HEENT: Atraumatic, Normocephalic. Neck supple. No masses, No LAD.  Bilateral TM wnl, oropharynx normal.  PEERL,EOMI.   Ears and Nose: No external deformity. CV: RRR, No  M/G/R. No JVD. No thrill. No extra heart sounds. PULM: CTA B, no wheezes, crackles, rhonchi. No retractions. No resp. distress. No accessory muscle use. ABD: S,  ND, +BS. No rebound. No HSM.  Minimal epigastric tenderness EXTR: No c/c/e NEURO Normal gait.  PSYCH: Normally interactive. Conversant. Not depressed or anxious appearing.  Calm demeanor.  No CVA tenderness  Results for orders placed or performed in visit on 08/18/15  POCT Urinalysis Dipstick (Automated)  Result Value Ref Range   Color, UA dark    Clarity, UA clear    Glucose, UA negative    Bilirubin, UA negative    Ketones, UA negative    Spec Grav, UA 1.025    Blood, UA trace    pH, UA 6.0    Protein, UA 1+    Urobilinogen, UA 2.0    Nitrite, UA negative    Leukocytes, UA Negative Negative    Creat clearance is >60 Assessment and Plan: Acute pyelonephritis - Plan: Urine Microscopic Only, CBC, levofloxacin (LEVAQUIN) 750 MG tablet, cefTRIAXone (ROCEPHIN) injection 1 g  Dysuria - Plan: POCT Urinalysis Dipstick (Automated), Urine culture, Comprehensive metabolic panel  Here today with 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 Florene Glen- if he likes I can refer to urology to eval recurrent pyelo See patient instructions for more details.    Signed Lamar Blinks, MD  Called pt when the rest of labs came on in 7/13  Results for orders placed or performed in visit on 08/18/15  Comprehensive metabolic panel  Result Value Ref Range   Sodium 133 (L) 135 - 145 mEq/L   Potassium 4.0 3.5 - 5.1 mEq/L   Chloride 101 96 - 112 mEq/L   CO2 29 19 - 32 mEq/L   Glucose, Bld 94 70 - 99 mg/dL   BUN 19 6 - 23 mg/dL   Creatinine, Ser 1.91 (H) 0.40 - 1.50 mg/dL   Total Bilirubin 2.1 (H) 0.2 - 1.2 mg/dL   Alkaline Phosphatase 94 39 - 117 U/L   AST 51 (H) 0 - 37 U/L   ALT 76 (H) 0 - 53 U/L   Total Protein 7.6 6.0 - 8.3  g/dL   Albumin 4.1 3.5 - 5.2 g/dL   Calcium 9.3 8.4 - 10.5 mg/dL   GFR 50.22 (L) >60.00 mL/min  Urine Microscopic Only  Result Value Ref Range   WBC, UA 3-6/hpf (A) 0-2/hpf   RBC / HPF 0-2/hpf 0-2/hpf   Squamous Epithelial / LPF Rare(0-4/hpf) Rare(0-4/hpf)   Bacteria,  UA Few(10-50/hpf) (A) None  CBC  Result Value Ref Range   WBC 18.1 Repeated and verified X2. (HH) 4.0 - 10.5 K/uL   RBC 5.37 4.22 - 5.81 Mil/uL   Platelets 255.0 150.0 - 400.0 K/uL   Hemoglobin 15.6 13.0 - 17.0 g/dL   HCT 47.1 39.0 - 52.0 %   MCV 87.6 78.0 - 100.0 fl   MCHC 33.2 30.0 - 36.0 g/dL   RDW 13.9 11.5 - 15.5 %  POCT Urinalysis Dipstick (Automated)  Result Value Ref Range   Color, UA dark    Clarity, UA clear    Glucose, UA negative    Bilirubin, UA negative    Ketones, UA negative    Spec Grav, UA 1.025    Blood, UA trace    pH, UA 6.0    Protein, UA 1+    Urobilinogen, UA 2.0    Nitrite, UA negative    Leukocytes, UA Negative Negative   His WBC count is quite elevated which is not surprising. He reports that he is feeling much better, denies any fever, chills, or back pain.  States abd discomfort is improved.  Also noted elevated bili and LFTS- mild. [place] one month ago showed normal GB without stones and CT abd was negative except for pyelonephritis.  Will plan to repeat LFTs in one week as a lab visit only.  However if he is getting worse he will alert me right away   7/14- received his culture- he has a multidrug resistant e coli.  Discussed with ID pharmacist at [facility]- it pt is doing better may try fosfomycin 1 pack every 3 days for 3 doses.  If not doing ok will need admission for IV abx.  Called pt to discuss- he is feeling much better.  Found fosfomycin at [facility] on [address]- called pt and let him know.  He will stop levaquin and change to fosfomycin, he will go to the ER if he does not continue to improve for likely admission  Meds ordered this encounter  Medications  . levofloxacin (LEVAQUIN)  750 MG tablet    Sig: Take 1 tablet (750 mg total) by mouth daily.    Dispense:  10 tablet    Refill:  0  . cefTRIAXone (ROCEPHIN) injection 1 g    Sig:     Order Specific Question:  Antibiotic Indication:    Answer:  Other Indication (list below)    Order Specific Question:  Other Indication:    Answer:  pyelonephritis  . fosfomycin (MONUROL) 3 g PACK    Sig: Take 3 g by mouth once. Repeat every 3 days for 3 doses total    Dispense:  3 packet    Refill:  0   Called to check on him 7/17- he is feeling well, he has a urology appt in about 10 days.  He was able to get the fosfomycin without any difficulty

## 2015-08-18 NOTE — Progress Notes (Signed)
Pre visit review using our clinic review tool, if applicable. No additional management support is needed unless otherwise documented below in the visit note. 

## 2015-08-19 ENCOUNTER — Telehealth: Payer: Self-pay | Admitting: *Deleted

## 2015-08-19 LAB — COMPREHENSIVE METABOLIC PANEL
ALT: 76 U/L — ABNORMAL HIGH (ref 0–53)
AST: 51 U/L — ABNORMAL HIGH (ref 0–37)
Albumin: 4.1 g/dL (ref 3.5–5.2)
Alkaline Phosphatase: 94 U/L (ref 39–117)
BUN: 19 mg/dL (ref 6–23)
CHLORIDE: 101 meq/L (ref 96–112)
CO2: 29 meq/L (ref 19–32)
Calcium: 9.3 mg/dL (ref 8.4–10.5)
Creatinine, Ser: 1.91 mg/dL — ABNORMAL HIGH (ref 0.40–1.50)
GFR: 50.22 mL/min — AB (ref >60.00)
GLUCOSE: 94 mg/dL (ref 70–99)
POTASSIUM: 4 meq/L (ref 3.5–5.1)
SODIUM: 133 meq/L — AB (ref 135–145)
Total Bilirubin: 2.1 mg/dL — ABNORMAL HIGH (ref 0.2–1.2)
Total Protein: 7.6 g/dL (ref 6.0–8.3)

## 2015-08-19 LAB — CBC
HEMATOCRIT: 47.1 % (ref 39.0–52.0)
HEMOGLOBIN: 15.6 g/dL (ref 13.0–17.0)
MCHC: 33.2 g/dL (ref 30.0–36.0)
MCV: 87.6 fl (ref 78.0–100.0)
PLATELETS: 255 10*3/uL (ref 150.0–400.0)
RBC: 5.37 Mil/uL (ref 4.22–5.81)
RDW: 13.9 % (ref 11.5–15.5)
WBC: 18.1 10*3/uL (ref 4.0–10.5)

## 2015-08-19 LAB — URINALYSIS, MICROSCOPIC ONLY

## 2015-08-19 NOTE — Telephone Encounter (Signed)
Called pt.

## 2015-08-19 NOTE — Telephone Encounter (Signed)
elam lab reporting critical , pts WBC @ 18.1

## 2015-08-19 NOTE — Telephone Encounter (Signed)
Provider aware

## 2015-08-19 NOTE — Addendum Note (Signed)
Addended by: Lamar Blinks C on: 08/19/2015 03:32 PM   Modules accepted: Orders

## 2015-08-20 ENCOUNTER — Telehealth: Payer: Self-pay | Admitting: General Practice

## 2015-08-20 ENCOUNTER — Other Ambulatory Visit: Payer: Self-pay | Admitting: Internal Medicine

## 2015-08-20 LAB — URINE CULTURE: Colony Count: >=100000

## 2015-08-20 MED ORDER — FOSFOMYCIN TROMETHAMINE 3 G PO PACK: 3.0000 g | PACK | Freq: Once | ORAL | Status: DC

## 2015-08-20 NOTE — Telephone Encounter (Signed)
Office notes have been printed and placed up front for pick.  Denise---Please call patient and let him know that they are ready.  Thanks.

## 2015-08-20 NOTE — Telephone Encounter (Signed)
Spoke to pt to see exactly which notes he needed. Pt request office notes from most recent visits from Dr. Lorelei Pont and one visit from Dr. Larose Kells back in May. Office notes faxed to Dr. Harlow Asa at [phone].

## 2015-08-20 NOTE — Addendum Note (Signed)
Addended by: Lamar Blinks C on: 08/20/2015 06:45 PM   Modules accepted: Orders

## 2015-08-20 NOTE — Telephone Encounter (Signed)
°  Relationship to patient: Can be reached: [phone]   Reason for call: Patient needs a copy of his last 2 office visit notes to take to the Urologist. Patient has been seen by Dr. Lorelei Pont and Debbrah Alar for this problem and is scheduled to Establish Care with Dr. Lorelei Pont on 8/2. Please call patient when OV Notes are ready for pick up.

## 2015-08-25 ENCOUNTER — Telehealth: Payer: Self-pay | Admitting: Internal Medicine

## 2015-08-25 NOTE — Telephone Encounter (Signed)
Pt need new Rx for Benicar and Amlodipine   Pharm:  [facility]

## 2015-08-26 ENCOUNTER — Other Ambulatory Visit: Payer: Self-pay | Admitting: Emergency Medicine

## 2015-08-26 MED ORDER — OLMESARTAN MEDOXOMIL-HCTZ 20-12.5 MG PO TABS: 1.0000 | ORAL_TABLET | Freq: Every day | ORAL | Status: DC

## 2015-08-26 MED ORDER — AMLODIPINE BESYLATE 5 MG PO TABS: 5.0000 mg | ORAL_TABLET | Freq: Every day | ORAL | Status: DC

## 2015-09-07 ENCOUNTER — Telehealth: Payer: Self-pay | Admitting: *Deleted

## 2015-09-07 ENCOUNTER — Encounter: Payer: Self-pay | Admitting: *Deleted

## 2015-09-07 NOTE — Telephone Encounter (Signed)
Pre-Visit Call completed with patient and chart updated.   Pre-Visit Info documented in Specialty Comments under [employer].    

## 2015-09-08 ENCOUNTER — Encounter: Payer: Self-pay | Admitting: Family Medicine

## 2015-09-08 ENCOUNTER — Ambulatory Visit (INDEPENDENT_AMBULATORY_CARE_PROVIDER_SITE_OTHER): Payer: BLUE CROSS/BLUE SHIELD | Admitting: Family Medicine

## 2015-09-08 VITALS — BP 110/74 | HR 71 | Temp 98.1°F | Ht 71.0 in | Wt 243.0 lb

## 2015-09-08 DIAGNOSIS — N183 Chronic kidney disease, stage 3 (moderate): Secondary | ICD-10-CM | POA: Diagnosis not present

## 2015-09-08 DIAGNOSIS — Z8639 Personal history of other endocrine, nutritional and metabolic disease: Secondary | ICD-10-CM

## 2015-09-08 NOTE — Progress Notes (Signed)
Pre visit review using our clinic review tool, if applicable. No additional management support is needed unless otherwise documented below in the visit note. 

## 2015-09-08 NOTE — Patient Instructions (Signed)
It was great to see you today- I am glad that you are doing well!  Please make a lab appt at the front desk for fasting morning labs at your convenience.  Take care!

## 2015-09-08 NOTE — Progress Notes (Signed)
[place] at [facility] [address] [place], [place] [address] [phone]  Date:  09/08/2015   Name:  Erik Weber   DOB:  December 11, 1975   MRN:  [id number]  PCP:  Lamar Blinks, MD    Chief Complaint: Establish Care (Pt here to est care. )   History of Present Illness:  Erik Weber is a 40 y.o. very pleasant male patient who presents with the following:  Here today for a follow-up visit.  He has had pyelo twice over the last few months. He is now seeing a urologist- Dr. Burnell Blanks with [facility] in [place]. He has a cysto scheduled on Friday, and they also plan to look for reflux.   Hs is also a pt of Dr. Florene Glen for his CRI- he is seen once a year for stable renal disease He had a negative urine culture last week with urology  We had to treat him with fosfomycin for his last infection due to multidrug resistance. He feels that he has recovered fully  He is feeling well, no belly pain but he does have some back pain from moving recently.    He takes amlodipine and also benicar. He is not taking phentermine any more.    He did use a cholesterol med for a while- however this was stopped a sit seemed he did not need it  He is not fasting today but is ok with coming back for labs soon  Wt Readings from Last 3 Encounters:  09/08/15 243 lb (110.2 kg)  08/18/15 245 lb 9.6 oz (111.4 kg)  06/16/15 246 lb 9.6 oz (111.9 kg)   He is trying to lose weight; he will exercise at the gym when he can.  His daughter is 105 years old.  It is harder to exercise when she is out of school for the summer.     Patient Active Problem List   Diagnosis Date Noted  . Encounter for HIV counseling 11/28/2013  . Obesity 08/18/2013  . Tinea versicolor 05/19/2013  . Preventative health care 05/19/2013  . Hypertriglyceridemia 05/28/2012  . Hypogonadism male 06/07/2011  . General medical examination 08/05/2010  . Essential hypertension  09/21/2009  . 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 GERD 04/04/2007    Past Medical History:  Diagnosis Date  . Childhood asthma   . Chronic renal insufficiency   . Hypertension   . Positive TB test     No past surgical history on file.  Social History  Substance Use Topics  . Smoking status: Never Smoker  . Smokeless tobacco: Never Used  . Alcohol use No    Family History  Problem Relation Age of Onset  . Kidney cancer Mother 42  . Cancer Mother   . Hypertension Father     age 79  . Diabetes Father   . Other      no colon, no prostate cancer  . Lung cancer      unlce    No Known Allergies  Medication list has been reviewed and updated.  Current Outpatient Prescriptions on File Prior to Visit  Medication Sig Dispense Refill  . amLODipine (NORVASC) 5 MG tablet Take 1 tablet (5 mg total) by mouth daily. 90 tablet 0  . olmesartan-hydrochlorothiazide (BENICAR HCT) 20-12.5 MG tablet Take 1 tablet by mouth daily. 90 tablet 0  . phentermine (ADIPEX-P) 37.5 MG tablet Take 1 tablet by mouth daily.  5  .  TRUVADA 200-300 MG tablet Take 1 tablet by mouth daily.  5   No current facility-administered medications on file prior to visit.     Review of Systems: No CP, SOB, nausea, vomiting, fevers, or chills. No SE of the BP meds such as hypotension noted  As per HPI- otherwise negative.   Physical Examination: Vitals:   09/08/15 1049  BP: 110/74  Pulse: 71  Temp: 98.1 F (36.7 C)   Vitals:   09/08/15 1049  Weight: 243 lb (110.2 kg)  Height: 5' 11"  (1.803 m)   Body mass index is 33.89 kg/m. Ideal Body Weight: Weight in (lb) to have BMI = 25: 178.9  GEN: WDWN, NAD, Non-toxic, A & O x 3, obese, looks well HEENT: Atraumatic, Normocephalic. Neck supple. No masses, No LAD.  Bilateral TM wnl, oropharynx normal.  PEERL,EOMI.   Ears and Nose: No external deformity. CV: RRR, No M/G/R. No JVD. No thrill. No extra heart sounds. PULM: CTA B, no  wheezes, crackles, rhonchi. No retractions. No resp. distress. No accessory muscle use. ABD: S, NT, ND. No rebound. No HSM. EXTR: No c/c/e NEURO Normal gait.  PSYCH: Normally interactive. Conversant. Not depressed or anxious appearing.  Calm demeanor.    Assessment and Plan: Chronic renal disease, stage 3 (moderate) - Plan: Basic metabolic panel  History of elevated lipids - Plan: Lipid panel  Here today to establish care. Also needs to monitor his lipids and CRI- he had a recent bump in his creat likely due to pyelonephritis.  He will come back for fasting labs asap  BP looks great with current meds   Signed Lamar Blinks, MD

## 2015-09-09 ENCOUNTER — Other Ambulatory Visit: Payer: BLUE CROSS/BLUE SHIELD

## 2015-09-09 NOTE — Telephone Encounter (Signed)
Refilled on 08/26/15

## 2015-09-25 ENCOUNTER — Other Ambulatory Visit: Payer: Self-pay | Admitting: Family Medicine

## 2015-10-06 ENCOUNTER — Other Ambulatory Visit: Payer: Self-pay | Admitting: Emergency Medicine

## 2015-10-06 ENCOUNTER — Other Ambulatory Visit (INDEPENDENT_AMBULATORY_CARE_PROVIDER_SITE_OTHER): Payer: BLUE CROSS/BLUE SHIELD

## 2015-10-06 DIAGNOSIS — R17 Unspecified jaundice: Secondary | ICD-10-CM

## 2015-10-06 DIAGNOSIS — Z113 Encounter for screening for infections with a predominantly sexual mode of transmission: Secondary | ICD-10-CM

## 2015-10-06 DIAGNOSIS — R7989 Other specified abnormal findings of blood chemistry: Secondary | ICD-10-CM

## 2015-10-06 DIAGNOSIS — N183 Chronic kidney disease, stage 3 (moderate): Secondary | ICD-10-CM

## 2015-10-06 DIAGNOSIS — Z8639 Personal history of other endocrine, nutritional and metabolic disease: Secondary | ICD-10-CM

## 2015-10-06 DIAGNOSIS — D72829 Elevated white blood cell count, unspecified: Secondary | ICD-10-CM

## 2015-10-06 LAB — CBC
HEMATOCRIT: 46.4 % (ref 39.0–52.0)
Hemoglobin: 15.6 g/dL (ref 13.0–17.0)
MCHC: 33.6 g/dL (ref 30.0–36.0)
MCV: 87 fl (ref 78.0–100.0)
PLATELETS: 304 10*3/uL (ref 150.0–400.0)
RBC: 5.33 Mil/uL (ref 4.22–5.81)
RDW: 13.3 % (ref 11.5–15.5)
WBC: 10 10*3/uL (ref 4.0–10.5)

## 2015-10-06 LAB — LIPID PANEL
CHOLESTEROL: 191 mg/dL (ref 0–200)
HDL: 39.2 mg/dL (ref >39.00)
NonHDL: 151.85
Total CHOL/HDL Ratio: 5
Triglycerides: 205 mg/dL — ABNORMAL HIGH (ref 0.0–149.0)
VLDL: 41 mg/dL — ABNORMAL HIGH (ref 0.0–40.0)

## 2015-10-06 LAB — COMPREHENSIVE METABOLIC PANEL
ALBUMIN: 4.2 g/dL (ref 3.5–5.2)
ALT: 16 U/L (ref 0–53)
AST: 17 U/L (ref 0–37)
Alkaline Phosphatase: 57 U/L (ref 39–117)
BUN: 13 mg/dL (ref 6–23)
CALCIUM: 9.2 mg/dL (ref 8.4–10.5)
CHLORIDE: 101 meq/L (ref 96–112)
CO2: 33 mEq/L — ABNORMAL HIGH (ref 19–32)
Creatinine, Ser: 1.57 mg/dL — ABNORMAL HIGH (ref 0.40–1.50)
GFR: 62.93 mL/min (ref >60.00)
Glucose, Bld: 92 mg/dL (ref 70–99)
POTASSIUM: 3.8 meq/L (ref 3.5–5.1)
Sodium: 138 mEq/L (ref 135–145)
Total Bilirubin: 0.6 mg/dL (ref 0.2–1.2)
Total Protein: 7.5 g/dL (ref 6.0–8.3)

## 2015-10-06 LAB — HIV ANTIBODY (ROUTINE TESTING W REFLEX): HIV: NONREACTIVE

## 2015-10-06 LAB — LDL CHOLESTEROL, DIRECT: LDL DIRECT: 105 mg/dL

## 2015-10-06 NOTE — Addendum Note (Signed)
Addended by: Caffie Pinto on: 10/06/2015 02:38 PM   Modules accepted: Orders

## 2015-12-07 ENCOUNTER — Other Ambulatory Visit: Payer: Self-pay | Admitting: Family Medicine

## 2015-12-08 ENCOUNTER — Other Ambulatory Visit: Payer: Self-pay | Admitting: Emergency Medicine

## 2015-12-08 MED ORDER — OLMESARTAN MEDOXOMIL-HCTZ 20-12.5 MG PO TABS: 1.0000 | ORAL_TABLET | Freq: Every day | ORAL | 0 refills | Status: DC

## 2016-01-07 ENCOUNTER — Encounter: Payer: Self-pay | Admitting: Nurse Practitioner

## 2016-01-18 ENCOUNTER — Ambulatory Visit (INDEPENDENT_AMBULATORY_CARE_PROVIDER_SITE_OTHER): Payer: BLUE CROSS/BLUE SHIELD | Admitting: Nurse Practitioner

## 2016-01-18 ENCOUNTER — Encounter: Payer: Self-pay | Admitting: Nurse Practitioner

## 2016-01-18 VITALS — BP 120/80 | HR 85 | Ht 71.0 in | Wt 243.0 lb

## 2016-01-18 DIAGNOSIS — R1310 Dysphagia, unspecified: Secondary | ICD-10-CM

## 2016-01-18 DIAGNOSIS — K219 Gastro-esophageal reflux disease without esophagitis: Secondary | ICD-10-CM

## 2016-01-18 NOTE — Patient Instructions (Signed)
You have been scheduled for an endoscopy. Please follow written instructions given to you at your visit today. If you use inhalers (even only as needed), please bring them with you on the day of your procedure. Your physician has requested that you go to www.startemmi.com and enter the access code given to you at your visit today. This web site gives a general overview about your procedure. However, you should still follow specific instructions given to you by our office regarding your preparation for the procedure.

## 2016-01-18 NOTE — Progress Notes (Signed)
      HPI:  Patient is 40 year old male, self-referred for evaluation of swallowing problems. Patient began having solid food dysphasia, especially to meats, over 2 years ago. Initially warm water would facilitate passage of meats. When this stopped working patient used [employer] which also helped for while. Now over the last few months patient's actually vomiting some of the stagnant meet. No odynophagia. No weight loss. Patient has chronic intermittent GERD but is only bothered by heartburn 1-2 times a week and Zantac helps. No other gastrointestinal complaints.    Past Medical History:  Diagnosis Date  . Childhood asthma   . Chronic renal insufficiency   . Hypertension   . Positive TB test     History reviewed. No pertinent surgical history. Family History  Problem Relation Age of Onset  . Kidney cancer Mother 22  . Cancer Mother   . Hypertension Father     age 42  . Diabetes Father   . Other      no colon, no prostate cancer  . Lung cancer      unlce   Social History  Substance Use Topics  . Smoking status: Never Smoker  . Smokeless tobacco: Never Used  . Alcohol use No   Current Outpatient Prescriptions  Medication Sig Dispense Refill  . amLODipine (NORVASC) 5 MG tablet TAKE ONE TABLET BY MOUTH ONCE DAILY 90 tablet 0  . olmesartan-hydrochlorothiazide (BENICAR HCT) 20-12.5 MG tablet Take 1 tablet by mouth daily. 90 tablet 0  . phentermine (ADIPEX-P) 37.5 MG tablet Take 1 tablet by mouth daily.  5  . TRUVADA 200-300 MG tablet Take 1 tablet by mouth daily.  5   No current facility-administered medications for this visit.    No Known Allergies   Review of Systems: Positive for allergy, sinus trouble, back pain, headaches, excessive thirst and excessive urination . All other systems reviewed and negative except where noted in HPI.    Physical Exam: BP 120/80   Pulse 85   Ht 5' 11"  (1.803 m)   Wt 243 lb (110.2 kg)   BMI 33.89 kg/m  Constitutional:   Well-developed, black male in no acute distress. Psychiatric: Normal mood and affect. Behavior is normal. HEENT: Normocephalic and atraumatic. Conjunctivae are normal. No scleral icterus. Neck supple.  Cardiovascular: Normal rate, regular rhythm.  Pulmonary/chest: Effort normal and breath sounds normal. No wheezing, rales or rhonchi. Abdominal: Soft, nondistended, nontender. Bowel sounds active throughout. There are no masses palpable. No hepatomegaly. Extremities: no edema Lymphadenopathy: No cervical adenopathy noted. Neurological: Alert and oriented to person place and time. Skin: Skin is warm and dry. No rashes noted.   ASSESSMENT AND PLAN:  40 year old male with 2+ year history of solid food dysphagia, mainly to meats.  -For further evaluation patient will be scheduled for upper endoscopy with possible dilation. The risks and benefits of EGD were discussed and the patient agrees to proceed.   GERD, chronic intermittent. Heartburn 1-2 times a week, Zantac helps -Continue Zantac as needed. Further evaluation at time of EGD   Tye Savoy NP 01/18/2016, 9:00 AM   Cop[place], Gay Filler, MD

## 2016-01-18 NOTE — Progress Notes (Signed)
I agree with the above note, plan 

## 2016-02-04 ENCOUNTER — Encounter: Payer: Self-pay | Admitting: Nurse Practitioner

## 2016-02-07 HISTORY — PX: HAND SURGERY: SHX662

## 2016-03-02 ENCOUNTER — Encounter: Payer: Self-pay | Admitting: Gastroenterology

## 2016-03-07 ENCOUNTER — Encounter: Payer: Self-pay | Admitting: Family Medicine

## 2016-03-07 ENCOUNTER — Ambulatory Visit (INDEPENDENT_AMBULATORY_CARE_PROVIDER_SITE_OTHER): Payer: BLUE CROSS/BLUE SHIELD | Admitting: Family Medicine

## 2016-03-07 VITALS — BP 143/86 | HR 88 | Temp 98.2°F | Resp 20 | Wt 243.0 lb

## 2016-03-07 DIAGNOSIS — S6991XA Unspecified injury of right wrist, hand and finger(s), initial encounter: Secondary | ICD-10-CM | POA: Diagnosis not present

## 2016-03-07 NOTE — Patient Instructions (Signed)
Please report to [facility] on [address]  They will see you today at 2:45 (report 30 minutes earlier).

## 2016-03-07 NOTE — Progress Notes (Signed)
Erik Weber , [birth year], 41 y.o., male MRN: [id number] Patient Care Team    Relationship Specialty Notifications Start End  Darreld Mclean, MD PCP - General Family Medicine  09/08/15   Estanislado Emms, MD Consulting Physician Nephrology  09/07/15   Milana Na, MD Consulting Physician Urology  09/07/15     CC: hand injury Subjective: Pt presents for an acute OV with complaints of right hand injury of 1 day duration.  Pt was running up the stairs and fell on his right hand. He heard a pop located over his right 5th metacarpal. He states it instantly became swollen and painful. He can bend his 5th finger with pain, he can not straighten his 5th finger. He had taken aleve to help with the discomfort.   No Known Allergies Social History  Substance Use Topics  . Smoking status: Never Smoker  . Smokeless tobacco: Never Used  . Alcohol use No   Past Medical History:  Diagnosis Date  . Childhood asthma   . Chronic renal insufficiency   . Hypertension   . Positive TB test    History reviewed. No pertinent surgical history. Family History  Problem Relation Age of Onset  . Kidney cancer Mother 60  . Cancer Mother   . Hypertension Father     age 64  . Diabetes Father   . Other      no colon, no prostate cancer  . Lung cancer      unlce   Allergies as of 03/07/2016   No Known Allergies     Medication List       Accurate as of 03/07/16  1:37 PM. Always use your most recent med list.          amLODipine 5 MG tablet Commonly known as:  NORVASC TAKE ONE TABLET BY MOUTH ONCE DAILY   clotrimazole-betamethasone cream Commonly known as:  LOTRISONE   ketoconazole 2 % shampoo Commonly known as:  NIZORAL   olmesartan-hydrochlorothiazide 20-12.5 MG tablet Commonly known as:  BENICAR HCT Take 1 tablet by mouth daily.   phentermine 37.5 MG tablet Commonly known as:  ADIPEX-P Take 1 tablet by mouth daily.   TRUVADA 200-300 MG tablet Generic drug:   emtricitabine-tenofovir Take 1 tablet by mouth daily.       No results found for this or any previous visit (from the past 24 hour(s)). No results found.   ROS: Negative, with the exception of above mentioned in HPI   Objective:  BP (!) 143/86 (BP Location: Left Arm, Patient Position: Sitting, Cuff Size: Large)   Pulse 88   Temp 98.2 F (36.8 C)   Resp 20   Wt 243 lb (110.2 kg)   SpO2 100%   BMI 33.89 kg/m  Body mass index is 33.89 kg/m. Gen: Afebrile. No acute distress. Nontoxic in appearance, well developed, well nourished. Pleasant AAM.  MSK: mild bruising over 5th metacarpal. Moderate soft tissue swelling 4th/5th metacarpal. TTP 4th and 5th proximal/midshaft MC and over proximal joint.Pain with flexion, decreased ext 5th finger. Sensation intact distally. Good cap refill.  Skin:  Skin intact   Assessment/Plan: Erik Weber is a 41 y.o. male present for acute OV for  Injury of right hand, initial encounter - deformity palpated over 5th metacarpal, TTP, mod swelling, decreased ROM. Fracture of 5th metacarpal likely. Discussed obtaining xray at [place] and wait on report, then refer vs referring straight to ortho.  - [facility] office contacted and were  able to see him right away, pt agreeable to report straight to [facility].    electronically signed by:  Howard Pouch, DO  [place]

## 2016-03-10 ENCOUNTER — Ambulatory Visit (AMBULATORY_SURGERY_CENTER): Payer: BLUE CROSS/BLUE SHIELD | Admitting: Gastroenterology

## 2016-03-10 ENCOUNTER — Encounter: Payer: Self-pay | Admitting: Gastroenterology

## 2016-03-10 VITALS — BP 110/76 | HR 67 | Temp 97.1°F | Resp 18 | Ht 71.0 in | Wt 243.0 lb

## 2016-03-10 DIAGNOSIS — R1310 Dysphagia, unspecified: Secondary | ICD-10-CM

## 2016-03-10 DIAGNOSIS — K222 Esophageal obstruction: Secondary | ICD-10-CM | POA: Diagnosis not present

## 2016-03-10 DIAGNOSIS — K219 Gastro-esophageal reflux disease without esophagitis: Secondary | ICD-10-CM | POA: Diagnosis present

## 2016-03-10 HISTORY — PX: UPPER GI ENDOSCOPY: SHX6162

## 2016-03-10 MED ORDER — SODIUM CHLORIDE 0.9 % IV SOLN: 500.0000 mL | INTRAVENOUS | Status: DC

## 2016-03-10 NOTE — Progress Notes (Signed)
Called to room to assist during endoscopic procedure.  Patient ID and intended procedure confirmed with present staff. Received instructions for my participation in the procedure from the performing physician.  

## 2016-03-10 NOTE — Op Note (Signed)
[facility] Patient Name: Erik Weber Procedure Date: 03/10/2016 3:18 PM MRN: [id number] Endoscopist: Milus Banister , MD Age: 41 Referring MD:  Date of Birth: March 12, 1975 Gender: Male Account #: [id number] Procedure:                Upper GI endoscopy Indications:              Dysphagia Medicines:                Monitored Anesthesia Care Procedure:                Pre-Anesthesia Assessment:                           - Prior to the procedure, a History and Physical                            was performed, and patient medications and                            allergies were reviewed. The patient's tolerance of                            previous anesthesia was also reviewed. The risks                            and benefits of the procedure and the sedation                            options and risks were discussed with the patient.                            All questions were answered, and informed consent                            was obtained. Prior Anticoagulants: The patient has                            taken no previous anticoagulant or antiplatelet                            agents. ASA Grade Assessment: II - A patient with                            mild systemic disease. After reviewing the risks                            and benefits, the patient was deemed in                            satisfactory condition to undergo the procedure.                           After obtaining informed consent, the endoscope was  passed under direct vision. Throughout the                            procedure, the patient's blood pressure, pulse, and                            oxygen saturations were monitored continuously. The                            Model GIF-HQ190 [id number]) scope was introduced                            through the mouth, and advanced to the second part                            of duodenum. The upper GI endoscopy was                        accomplished without difficulty. The patient                            tolerated the procedure well. Scope In: Scope Out: Findings:                 One mild benign-appearing, intrinsic stenosis                            (focal peptic stricture vs. thick Schatzki's ring)                            was found at the gastroesophageal junction. A TTS                            dilator was passed through the scope. Dilation with                            an 18-19-20 mm balloon dilator was performed to 19                            mm.                           The exam was otherwise without abnormality.                           Biopsies were obtained with cold forceps for                            histology in the proximal esophagus and in the                            distal esophagus. Complications:            No immediate complications. Estimated blood loss:  None. Estimated Blood Loss:     Estimated blood loss: none. Impression:               - Benign-appearing esophageal stenosis. Dilated.                           - The examination was otherwise normal.                           - Biopsies were obtained in the proximal esophagus                            and in the distal esophagus to check for signs of                            Eosinophilic Esophagitis. Recommendation:           - Patient has a contact number available for                            emergencies. The signs and symptoms of potential                            delayed complications were discussed with the                            patient. Return to normal activities tomorrow.                            Written discharge instructions were provided to the                            patient.                           - Resume previous diet.                           - Continue present medications.                           - Await pathology results. Milus Banister,  MD 03/10/2016 3:43:57 PM This report has been signed electronically.

## 2016-03-10 NOTE — Progress Notes (Signed)
To PACU  PT awake and Alert.Report to RN

## 2016-03-10 NOTE — Patient Instructions (Signed)
YOU HAD AN ENDOSCOPIC PROCEDURE TODAY AT [facility]:   Refer to the procedure report that was given to you for any specific questions about what was found during the examination.  If the procedure report does not answer your questions, please call your gastroenterologist to clarify.  If you requested that your care partner not be given the details of your procedure findings, then the procedure report has been included in a sealed envelope for you to review at your convenience later.  YOU SHOULD EXPECT: Some feelings of bloating in the abdomen. Passage of more gas than usual.  Walking can help get rid of the air that was put into your GI tract during the procedure and reduce the bloating. If you had a lower endoscopy (such as a colonoscopy or flexible sigmoidoscopy) you may notice spotting of blood in your stool or on the toilet paper. If you underwent a bowel prep for your procedure, you may not have a normal bowel movement for a few days.  Please Note:  You might notice some irritation and congestion in your nose or some drainage.  This is from the oxygen used during your procedure.  There is no need for concern and it should clear up in a day or so.  SYMPTOMS TO REPORT IMMEDIATELY:    Following upper endoscopy (EGD)  Vomiting of blood or coffee ground material  New chest pain or pain under the shoulder blades  Painful or persistently difficult swallowing  New shortness of breath  Fever of 100F or higher  Black, tarry-looking stools  For urgent or emergent issues, a gastroenterologist can be reached at any hour by calling [phone].   DIET:  We do recommend a small meal at first, but then you may proceed to your regular diet.  Drink plenty of fluids but you should avoid alcoholic beverages for 24 hours.  ACTIVITY:  You should plan to take it easy for the rest of today and you should NOT DRIVE or use heavy machinery until tomorrow (because of the sedation medicines used  during the test).    FOLLOW UP: Our staff will call the number listed on your records the next business day following your procedure to check on you and address any questions or concerns that you may have regarding the information given to you following your procedure. If we do not reach you, we will leave a message.  However, if you are feeling well and you are not experiencing any problems, there is no need to return our call.  We will assume that you have returned to your regular daily activities without incident.  If any biopsies were taken you will be contacted by phone or by letter within the next 1-3 weeks.  Please call us at [phone] if you have not heard about the biopsies in 3 weeks.    SIGNATURES/CONFIDENTIALITY: You and/or your care partner have signed paperwork which will be entered into your electronic medical record.  These signatures attest to the fact that that the information above on your After Visit Summary has been reviewed and is understood.  Full responsibility of the confidentiality of this discharge information lies with you and/or your care-partner.  Stricture information, esophagitis information, and post dilation diet information given with instructions.

## 2016-03-11 ENCOUNTER — Other Ambulatory Visit: Payer: Self-pay | Admitting: Family Medicine

## 2016-03-13 ENCOUNTER — Telehealth: Payer: Self-pay

## 2016-03-13 NOTE — Telephone Encounter (Signed)
  Follow up Call-  Call back number 03/10/2016  Post procedure Call Back phone  # [phone]  Permission to leave phone message Yes  Some recent data might be hidden    Patient was called for follow up after his procedure on 03/10/2016. No answer at the number given for follow up phone call. A message was left on the answering machine.

## 2016-03-13 NOTE — Telephone Encounter (Signed)
  Follow up Call-  Call back number 03/10/2016  Post procedure Call Back phone  # [phone]  Permission to leave phone message Yes  Some recent data might be hidden     Patient questions:  Do you have a fever, pain , or abdominal swelling? No. Pain Score  0 *  Have you tolerated food without any problems? Yes.    Have you been able to return to your normal activities? Yes.    Do you have any questions about your discharge instructions: Diet   No. Medications  No. Follow up visit  No.  Do you have questions or concerns about your Care? No.  Actions: * If pain score is 4 or above: No action needed, pain <4.

## 2016-03-23 ENCOUNTER — Other Ambulatory Visit: Payer: Self-pay

## 2016-03-23 MED ORDER — OMEPRAZOLE 40 MG PO CPDR: 40.0000 mg | DELAYED_RELEASE_CAPSULE | Freq: Every day | ORAL | 3 refills | Status: DC

## 2016-03-23 NOTE — Telephone Encounter (Signed)
Pt has been notified and prescription has been sent

## 2016-05-26 ENCOUNTER — Ambulatory Visit (INDEPENDENT_AMBULATORY_CARE_PROVIDER_SITE_OTHER): Payer: BLUE CROSS/BLUE SHIELD | Admitting: Family Medicine

## 2016-05-26 ENCOUNTER — Encounter: Payer: Self-pay | Admitting: Family Medicine

## 2016-05-26 VITALS — BP 100/68 | HR 67 | Temp 98.0°F | Ht 71.0 in | Wt 239.0 lb

## 2016-05-26 DIAGNOSIS — R109 Unspecified abdominal pain: Secondary | ICD-10-CM | POA: Diagnosis not present

## 2016-05-26 DIAGNOSIS — R197 Diarrhea, unspecified: Secondary | ICD-10-CM

## 2016-05-26 MED ORDER — DICYCLOMINE HCL 20 MG PO TABS: 20.0000 mg | ORAL_TABLET | Freq: Three times a day (TID) | ORAL | 0 refills | Status: DC

## 2016-05-26 MED FILL — DICYCLOMINE 20 MG TABLET: 20 | 15 days supply | Qty: 60 | Fill #0

## 2016-05-26 NOTE — Progress Notes (Signed)
Chief Complaint  Patient presents with  . Abdominal Pain    started on Mon night-with diarrhea  which started on Tues am     Subjective Erik Weber is a 41 y.o. male who presents with vomiting and diarrhea Symptoms began 4 days ago. Ate out and had a salad- diarrhea soon followed Patient has cramping and diarrhea Patient denies vomiting, fever, chills, headache, arthralgias and nausea No recent travel, abx use, or improvement. Evaluation to date: no Sick contacts: none known  Past Medical History:  Diagnosis Date  . Childhood asthma   . Chronic renal insufficiency   . Fracture of base of fifth metacarpal bone of right hand   . Hypertension   . Positive TB test    No past surgical history on file. Current Outpatient Prescriptions on File Prior to Visit  Medication Sig Dispense Refill  . amLODipine (NORVASC) 5 MG tablet TAKE ONE TABLET BY MOUTH ONCE DAILY 90 tablet 0  . clotrimazole-betamethasone (LOTRISONE) cream     . ketoconazole (NIZORAL) 2 % shampoo     . olmesartan-hydrochlorothiazide (BENICAR HCT) 20-12.5 MG tablet TAKE ONE TABLET BY MOUTH ONCE DAILY 90 tablet 0  . phentermine (ADIPEX-P) 37.5 MG tablet Take 1 tablet by mouth daily.  5  . TRUVADA 200-300 MG tablet Take 1 tablet by mouth daily.  5  . omeprazole (PRILOSEC) 40 MG capsule Take 1 capsule (40 mg total) by mouth daily. (Patient not taking: Reported on 05/26/2016) 90 capsule 3   No Known Allergies  Review of Systems Constitutional:  No fevers or chills Gastrointestinal:  As noted in the HPI  Exam BP 100/68 (BP Location: Left Arm, Patient Position: Sitting, Cuff Size: Large)   Pulse 67   Temp 98 F (36.7 C) (Oral)   Ht 5' 11"  (1.803 m)   Wt 239 lb (108.4 kg)   SpO2 98%   BMI 33.33 kg/m  General:  well developed, well hydrated, in no apparent distress Skin:  warm, no pallor or diaphoresis, no rashes Throat/Pharynx:  lips and gingiva without lesion; tongue and uvula midline; non-inflamed pharynx; no  exudates or postnasal drainage Lungs:  clear to auscultation, breath sounds equal bilaterally, no respiratory distress, no wheezes Cardio:  regular rate and rhythm without murmurs Abdomen:  abdomen soft, diffuse TTP; bowel sounds normal; no masses or organomegaly Psych: Appropriate judgement/insight, nml affect and mood  Assessment and Plan  Diarrhea, unspecified type - Plan: Stool Culture, Ova and parasite examination, Stool C-Diff Toxin Assay  Abdominal cramping - Plan: dicyclomine (BENTYL) 20 MG tablet  Gatorade, Bentyl, stool culture, will try to collect today. Avoid aggravating foods, discussed BRAT diet. Seek immediate care if starting to have fevers, worsening pain, blood in stool. The patient voiced understanding and agreement to the plan.  Fajardo, DO 05/26/16  3:39 PM

## 2016-05-26 NOTE — Progress Notes (Signed)
Pre visit review using our clinic review tool, if applicable. No additional management support is needed unless otherwise documented below in the visit note. 

## 2016-05-26 NOTE — Addendum Note (Signed)
Addended by: Peggyann Shoals on: 05/26/2016 04:49 PM   Modules accepted: Orders

## 2016-05-29 LAB — OVA AND PARASITE EXAMINATION: OP: NONE SEEN

## 2016-05-30 ENCOUNTER — Telehealth: Payer: Self-pay | Admitting: Family Medicine

## 2016-05-30 LAB — STOOL CULTURE

## 2016-05-30 NOTE — Telephone Encounter (Signed)
Patient informed of provider instructions.

## 2016-05-30 NOTE — Telephone Encounter (Signed)
So far nothing has grown, we will let him know when everything is complete or if anything comes up abnormal. TY.

## 2016-05-30 NOTE — Telephone Encounter (Signed)
Patient called in today requesting his Stool test results from 05/26/16. I did look and informed him not all completed yet/once results complete you would let him know those results

## 2016-06-05 LAB — CLOSTRIDIUM DIFFICILE CULTURE-FECAL

## 2016-06-12 ENCOUNTER — Other Ambulatory Visit: Payer: Self-pay | Admitting: Family Medicine

## 2016-09-21 ENCOUNTER — Other Ambulatory Visit: Payer: Self-pay | Admitting: Family Medicine

## 2016-10-16 DIAGNOSIS — Z87440 Personal history of urinary (tract) infections: Secondary | ICD-10-CM | POA: Diagnosis not present

## 2016-10-16 DIAGNOSIS — I129 Hypertensive chronic kidney disease with stage 1 through stage 4 chronic kidney disease, or unspecified chronic kidney disease: Secondary | ICD-10-CM | POA: Diagnosis not present

## 2016-10-16 DIAGNOSIS — N183 Chronic kidney disease, stage 3 (moderate): Secondary | ICD-10-CM | POA: Diagnosis not present

## 2016-10-16 DIAGNOSIS — I10 Essential (primary) hypertension: Secondary | ICD-10-CM | POA: Diagnosis not present

## 2016-10-18 ENCOUNTER — Other Ambulatory Visit: Payer: Self-pay | Admitting: Family Medicine

## 2016-10-27 DIAGNOSIS — M5136 Other intervertebral disc degeneration, lumbar region: Secondary | ICD-10-CM | POA: Diagnosis not present

## 2016-10-27 DIAGNOSIS — M545 Low back pain: Secondary | ICD-10-CM | POA: Diagnosis not present

## 2016-10-27 DIAGNOSIS — M791 Myalgia: Secondary | ICD-10-CM | POA: Diagnosis not present

## 2016-11-13 ENCOUNTER — Ambulatory Visit (INDEPENDENT_AMBULATORY_CARE_PROVIDER_SITE_OTHER): Payer: BLUE CROSS/BLUE SHIELD | Admitting: Family Medicine

## 2016-11-13 ENCOUNTER — Encounter: Payer: Self-pay | Admitting: Family Medicine

## 2016-11-13 VITALS — BP 134/87 | HR 75 | Temp 98.1°F | Ht 71.0 in | Wt 251.4 lb

## 2016-11-13 DIAGNOSIS — R0989 Other specified symptoms and signs involving the circulatory and respiratory systems: Secondary | ICD-10-CM

## 2016-11-13 DIAGNOSIS — Z114 Encounter for screening for human immunodeficiency virus [HIV]: Secondary | ICD-10-CM | POA: Diagnosis not present

## 2016-11-13 DIAGNOSIS — R05 Cough: Secondary | ICD-10-CM | POA: Diagnosis not present

## 2016-11-13 DIAGNOSIS — R0600 Dyspnea, unspecified: Secondary | ICD-10-CM

## 2016-11-13 DIAGNOSIS — R058 Other specified cough: Secondary | ICD-10-CM

## 2016-11-13 LAB — CBC
HEMATOCRIT: 44.8 % (ref 39.0–52.0)
Hemoglobin: 14.6 g/dL (ref 13.0–17.0)
MCHC: 32.6 g/dL (ref 30.0–36.0)
MCV: 87.9 fl (ref 78.0–100.0)
PLATELETS: 296 10*3/uL (ref 150.0–400.0)
RBC: 5.1 Mil/uL (ref 4.22–5.81)
RDW: 12.9 % (ref 11.5–15.5)
WBC: 8.7 10*3/uL (ref 4.0–10.5)

## 2016-11-13 MED ORDER — IPRATROPIUM BROMIDE 0.03 % NA SOLN: 2.0000 | Freq: Four times a day (QID) | NASAL | 6 refills | Status: DC

## 2016-11-13 MED ORDER — CEFDINIR 300 MG PO CAPS: 300.0000 mg | ORAL_CAPSULE | Freq: Two times a day (BID) | ORAL | 0 refills | Status: DC

## 2016-11-13 NOTE — Progress Notes (Signed)
[facility] [address], [place], [place] [address] [phone]  Date:  11/13/2016   Name:  Erik Weber   DOB:  [birth year]   MRN:  [id number]  PCP:  Darreld Mclean, MD    Chief Complaint: Sinus Problem   History of Present Illness:  Erik Weber is a 41 y.o. very pleasant male patient who presents with the following:  Here today with concern of a sinus infection- he has been sick for 5-6 days.  He first noted a tickle in his throat, then cough which was productive at first He still has a more dry cough curently He has some sinus pressure and pain but this is quite mild He has not noted any fever ST is resolved No earache No GI symptoms   He has tried some zyrtec, and dayquil/ mucinex  He has had a lot of OM- had a set of tubes as a child and again just 2 years ago BP Readings from Last 3 Encounters:  11/13/16 134/87  05/26/16 100/68  03/10/16 110/76    He has not used his truvada in a while- he would like an HIV test today   Patient Active Problem List   Diagnosis Date Noted  . Encounter for HIV counseling 11/28/2013  . Obesity 08/18/2013  . Tinea versicolor 05/19/2013  . Preventative health care 05/19/2013  . Hypertriglyceridemia 05/28/2012  . Hypogonadism male 06/07/2011  . General medical examination 08/05/2010  . Essential hypertension 09/21/2009  . 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 GERD 04/04/2007    Past Medical History:  Diagnosis Date  . Childhood asthma   . Chronic renal insufficiency   . Fracture of base of fifth metacarpal bone of right hand   . Hypertension   . Positive TB test     Past Surgical History:  Procedure Laterality Date  . HAND SURGERY  02/2016    Social History  Substance Use Topics  . Smoking status: Never Smoker  . Smokeless tobacco: Never Used  . Alcohol use No    Family History  Problem Relation Age of Onset  .  Kidney cancer Mother 29  . Cancer Mother   . Hypertension Father        age 66  . Diabetes Father   . Other Unknown        no colon, no prostate cancer  . Lung cancer Unknown        unlce    No Known Allergies  Medication list has been reviewed and updated.  Current Outpatient Prescriptions on File Prior to Visit  Medication Sig Dispense Refill  . amLODipine (NORVASC) 5 MG tablet TAKE 1 TABLET BY MOUTH ONCE DAILY 30 tablet 0  . olmesartan-hydrochlorothiazide (BENICAR HCT) 20-12.5 MG tablet TAKE 1 TABLET BY MOUTH ONCE DAILY 30 tablet 0  . clotrimazole-betamethasone (LOTRISONE) cream     . dicyclomine (BENTYL) 20 MG tablet Take 1 tablet (20 mg total) by mouth 4 (four) times daily -  before meals and at bedtime. (Patient not taking: Reported on 11/13/2016) 60 tablet 0  . omeprazole (PRILOSEC) 40 MG capsule Take 1 capsule (40 mg total) by mouth daily. (Patient not taking: Reported on 05/26/2016) 90 capsule 3  . phentermine (ADIPEX-P) 37.5 MG tablet Take 1 tablet by mouth daily.  5  . TRUVADA 200-300 MG tablet Take 1 tablet by mouth daily.  5   Current Facility-Administered Medications on File  Prior to Visit  Medication Dose Route Frequency Provider Last Rate Last Dose  . 0.9 %  sodium chloride infusion  500 mL Intravenous Continuous Milus Banister, MD        Review of Systems:  As per HPI- otherwise negative. He has noted PND which is irritating No rash No CP or SOB  Physical Examination: Vitals:   11/13/16 1346  BP: 134/87  Pulse: 75  Temp: 98.1 F (36.7 C)  SpO2: 98%   Vitals:   11/13/16 1346  Weight: 251 lb 6.4 oz (114 kg)  Height: 5' 11"  (1.803 m)   Body mass index is 35.06 kg/m. Ideal Body Weight: Weight in (lb) to have BMI = 25: 178.9  GEN: WDWN, NAD, Non-toxic, A & O x 3, overweight, otherwise looks well HEENT: Atraumatic, Normocephalic. Neck supple. No masses, No LAD.  Bilateral TM are scarred, oropharynx normal.  PEERL,EOMI.   Ears and Nose: No external  deformity. CV: RRR, No M/G/R. No JVD. No thrill. No extra heart sounds. PULM: CTA B, no wheezes, crackles, rhonchi. No retractions. No resp. distress. No accessory muscle use. ABD: S, NT, ND, +BS. No rebound. No HSM. EXTR: No c/c/e NEURO Normal gait.  PSYCH: Normally interactive. Conversant. Not depressed or anxious appearing.  Calm demeanor.    Assessment and Plan: Dry cough  Chest congestion - Plan: CBC, cefdinir (OMNICEF) 300 MG capsule  PND (paroxysmal nocturnal dyspnea) - Plan: ipratropium (ATROVENT) 0.03 % nasal spray  Screening for HIV (human immunodeficiency virus) - Plan: HIV antibody  Here today for a sick visit Likely a viral infection Continue OTC meds as needed and rx atrovent nasal to use as needed for PND HIV and CBC pending Gave him an rx for omnicef to hold and use if sx are not improved in the next few days  See patient instructions for more details.     Signed Lamar Blinks, MD

## 2016-11-13 NOTE — Patient Instructions (Signed)
It was good to see you today!  Given your spectrum of symptoms you likely have a cold (viral infection). However, we will make sure that your blood count is normal today (and will check your HIV while we are drawing blood!) Try the atrovent nasal spray up to 4x a day as needed for post- nasal drainage which can irritate your throat and cause cough  If you are not getting better in the next several days, you can fill and use the omnicef (antibioic) rx Please let me know if you are worse, running a fever, etc

## 2016-11-14 LAB — HIV ANTIBODY (ROUTINE TESTING W REFLEX): HIV 1&2 Ab, 4th Generation: NONREACTIVE

## 2016-11-22 ENCOUNTER — Other Ambulatory Visit: Payer: Self-pay | Admitting: Family Medicine

## 2016-11-27 ENCOUNTER — Other Ambulatory Visit: Payer: Self-pay | Admitting: Emergency Medicine

## 2016-11-27 MED ORDER — AMLODIPINE BESYLATE 5 MG PO TABS: 5.0000 mg | ORAL_TABLET | Freq: Every day | ORAL | 0 refills | Status: DC

## 2016-12-13 ENCOUNTER — Ambulatory Visit (INDEPENDENT_AMBULATORY_CARE_PROVIDER_SITE_OTHER): Payer: BLUE CROSS/BLUE SHIELD

## 2016-12-13 ENCOUNTER — Ambulatory Visit (INDEPENDENT_AMBULATORY_CARE_PROVIDER_SITE_OTHER): Payer: BLUE CROSS/BLUE SHIELD | Admitting: Podiatry

## 2016-12-13 ENCOUNTER — Ambulatory Visit: Payer: BLUE CROSS/BLUE SHIELD

## 2016-12-13 ENCOUNTER — Encounter: Payer: Self-pay | Admitting: Podiatry

## 2016-12-13 VITALS — BP 136/98 | HR 77 | Resp 16

## 2016-12-13 DIAGNOSIS — M25571 Pain in right ankle and joints of right foot: Secondary | ICD-10-CM

## 2016-12-13 DIAGNOSIS — M779 Enthesopathy, unspecified: Secondary | ICD-10-CM

## 2016-12-13 DIAGNOSIS — M7750 Other enthesopathy of unspecified foot: Secondary | ICD-10-CM

## 2016-12-13 MED ORDER — DICLOFENAC SODIUM 75 MG PO TBEC: 75.0000 mg | DELAYED_RELEASE_TABLET | Freq: Two times a day (BID) | ORAL | 2 refills | Status: DC

## 2016-12-13 NOTE — Patient Instructions (Signed)

## 2016-12-13 NOTE — Progress Notes (Signed)
   Subjective:    Patient ID: Erik Weber, male    DOB: 1975/10/29, 41 y.o.   MRN: [id number]  HPI    Review of Systems  All other systems reviewed and are negative.      Objective:   Physical Exam        Assessment & Plan:

## 2016-12-13 NOTE — Progress Notes (Signed)
Subjective:    Patient ID: Erik Weber, male   DOB: 41 y.o.   MRN: [id number]   HPI patient complains of pain in the back of the Achilles tendons bilateral moderate in intensity and worse when he gets up or after periods of sitting. Also complains of some swelling underneath the right foot not sure what might be causing    Review of Systems  All other systems reviewed and are negative.       Objective:  Physical Exam  Constitutional: He appears well-developed and well-nourished.  Cardiovascular: Intact distal pulses.  Pulmonary/Chest: Effort normal.  Musculoskeletal: Normal range of motion.  Neurological: He is alert.  Skin: Skin is warm.  Nursing note and vitals reviewed.  neurovascular status intact with discomfort around the Achilles tendon bilateral at the musculotendinous junction and mild discomfort plantar aspect right foot within the fascial band     Assessment:   Achilles tendinitis of a moderate nature bilateral with equinus condition noted and also probable plantar fasciitis right      Plan:   H&P x-rays reviewed and discussed physical therapy consisting of stretching exercises heel lift heat ice therapy and I placed on diclofenac 75 mg twice a day and gave him a brace for the right in order to lift the arch. Reappoint if symptoms were to indicate  X-rays indicate that there is no posterior spurring currently with moderate depression of the arch

## 2016-12-27 ENCOUNTER — Other Ambulatory Visit: Payer: Self-pay | Admitting: Family Medicine

## 2017-01-18 ENCOUNTER — Encounter: Payer: Self-pay | Admitting: Family Medicine

## 2017-01-18 ENCOUNTER — Ambulatory Visit: Payer: Self-pay | Admitting: *Deleted

## 2017-01-18 ENCOUNTER — Ambulatory Visit (INDEPENDENT_AMBULATORY_CARE_PROVIDER_SITE_OTHER): Payer: BLUE CROSS/BLUE SHIELD | Admitting: Family Medicine

## 2017-01-18 VITALS — BP 132/82 | HR 81 | Temp 98.4°F | Ht 71.0 in | Wt 257.4 lb

## 2017-01-18 DIAGNOSIS — K219 Gastro-esophageal reflux disease without esophagitis: Secondary | ICD-10-CM

## 2017-01-18 DIAGNOSIS — R079 Chest pain, unspecified: Secondary | ICD-10-CM | POA: Diagnosis not present

## 2017-01-18 LAB — TROPONIN I: TNIDX: 0 ug/L (ref 0.00–0.06)

## 2017-01-18 MED ORDER — SUCRALFATE 1 G PO TABS: 1.0000 g | ORAL_TABLET | Freq: Three times a day (TID) | ORAL | 0 refills | Status: DC

## 2017-01-18 MED ORDER — OMEPRAZOLE 40 MG PO CPDR: 40.0000 mg | DELAYED_RELEASE_CAPSULE | Freq: Every day | ORAL | 3 refills | Status: DC

## 2017-01-18 NOTE — Patient Instructions (Addendum)
It was good to see you today- please let me know if your symptoms do not resolve with our current treatment I am also going to check a troponin test today for further reassurance that this is not your heart  Use the prilosec daily, and the carafate 4x a dayfor 7- 10 days Avoid anything spicy or acidic (no tomatoes)

## 2017-01-18 NOTE — Telephone Encounter (Signed)
Pt called complaining of chest pain and burning in throat since Saturday; he states that he took acid reflux medication with no relief; he states that he is also having chest soreness, and he feels like something is in his throat; pt states that he has also been taking gas-x; pt states that he was not able to get an appt with endo MD until 01/26/17; will route to [facility] pool.  Reason for Disposition . [1] Patient claims chest pain is same as previously diagnosed "heartburn" AND [2] describes burning in chest AND [3] accompanying sour taste in mouth  Answer Assessment - Initial Assessment Questions 1. LOCATION: "Where does it hurt?"       CENTER OF CHEST 2. RADIATION: "Does the pain go anywhere else?" (e.g., into neck, jaw, arms, back)     no 3. ONSET: "When did the chest pain begin?" (Minutes, hours or days)      Saturday January 13, 2017 4. PATTERN "Does the pain come and go, or has it been constant since it started?"  "Does it get worse with exertion?"      Constant in chest, in throat when he eats or drinks 5. DURATION: "How long does it last" (e.g., seconds, minutes, hours)     constant 6. SEVERITY: "How bad is the pain?"  (e.g., Scale 1-10; mild, moderate, or severe)    - MILD (1-3): doesn't interfere with normal activities     - MODERATE (4-7): interferes with normal activities or awakens from sleep    - SEVERE (8-10): excruciating pain, unable to do any normal activities       Rated 5 out of 10 7. CARDIAC RISK FACTORS: "Do you have any history of heart problems or risk factors for heart disease?" (e.g., prior heart attack, angina; high blood pressure, diabetes, being overweight, high cholesterol, smoking, or strong family history of heart disease)     hypertension 8. PULMONARY RISK FACTORS: "Do you have any history of lung disease?"  (e.g., blood clots in lung, asthma, emphysema, birth control pills)     asthma 9. CAUSE: "What do you think is causing the chest pain?"     GI  problems 10. OTHER SYMPTOMS: "Do you have any other symptoms?" (e.g., dizziness, nausea, vomiting, sweating, fever, difficulty breathing, cough)       no 11. PREGNANCY: "Is there any chance you are pregnant?" "When was your last menstrual period?"       n/a  Protocols used: CHEST PAIN-A-AH

## 2017-01-18 NOTE — Telephone Encounter (Signed)
Please advise 

## 2017-01-18 NOTE — Progress Notes (Signed)
[place] at [facility] [address], [place], [place] [address] [phone]  Date:  01/18/2017   Name:  E[place] Weber   DOB:  Apr 08, 1975   MRN:  [id number]  PCP:  Darreld Mclean, MD    Chief Complaint: Gastroesophageal Reflux (c/o burning in throat that goes to chest. Present since this past Saturday. )   History of Present Illness:  E[place] Weber is a 41 y.o. very pleasant male patient who presents with the following:  He has noted about 5 days of chest and throat discomfort.  When the pain first began, he was awoken from sleep with this during the night.  He thought it felt like reflux. He took a prilosec, some gas-x and was able to go back to sleep although the discomfort did not resolve totally The discomfort is still there now When he tries to eat or drink now he feels like the material gets hung up in his throat- it is not getting stuck per se but it just seems to be harder than usual to swallow  He has had some GERD in the past, but never this severe He did have an esophageal stricture dilated per GI earlier this year and was told that he did have some scarring in his esophagus - February, Oretha Caprice  He has not noted any nausea or vomiting   He does not have any heart history except for controlled HTN Never done a stress test   The current pain is non- exertional   He has some 20 mg omeprazole that he is using as needed - he was given 40 mg by GI in the past but his not using this now No vomiting or diarrhea No SOB No rash No fever or chills  Pt given GI cocktail- got 80% relief of his sx per his report   Patient Active Problem List   Diagnosis Date Noted  . Encounter for HIV counseling 11/28/2013  . Obesity 08/18/2013  . Tinea versicolor 05/19/2013  . Preventative health care 05/19/2013  . Hypertriglyceridemia 05/28/2012  . Hypogonadism male 06/07/2011  . General medical examination 08/05/2010  .  Essential hypertension 09/21/2009  . 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 GERD 04/04/2007    Past Medical History:  Diagnosis Date  . Childhood asthma   . Chronic renal insufficiency   . Fracture of base of fifth metacarpal bone of right hand   . Hypertension   . Positive TB test     Past Surgical History:  Procedure Laterality Date  . HAND SURGERY  02/2016    Social History   Tobacco Use  . Smoking status: Never Smoker  . Smokeless tobacco: Never Used  Substance Use Topics  . Alcohol use: No    Alcohol/week: 0.0 oz  . Drug use: No    Family History  Problem Relation Age of Onset  . Kidney cancer Mother 20  . Cancer Mother   . Hypertension Father        age 23  . Diabetes Father   . Other Unknown        no colon, no prostate cancer  . Lung cancer Unknown        unlce    No Known Allergies  Medication list has been reviewed and updated.  Current Outpatient Medications on File Prior to Visit  Medication Sig Dispense Refill  . amLODipine (NORVASC) 5 MG tablet Take 1 tablet (  5 mg total) by mouth daily. 90 tablet 0  . cefdinir (OMNICEF) 300 MG capsule Take 1 capsule (300 mg total) by mouth 2 (two) times daily. 20 capsule 0  . clotrimazole-betamethasone (LOTRISONE) cream     . diclofenac (VOLTAREN) 75 MG EC tablet Take 1 tablet (75 mg total) 2 (two) times daily by mouth. 50 tablet 2  . dicyclomine (BENTYL) 20 MG tablet Take 1 tablet (20 mg total) by mouth 4 (four) times daily -  before meals and at bedtime. 60 tablet 0  . ipratropium (ATROVENT) 0.03 % nasal spray Place 2 sprays into the nose 4 (four) times daily. Use as needed for runny nose/ post nasal drip 30 mL 6  . olmesartan-hydrochlorothiazide (BENICAR HCT) 20-12.5 MG tablet TAKE 1 TABLET BY MOUTH ONCE DAILY 30 tablet 0  . omeprazole (PRILOSEC) 40 MG capsule Take 1 capsule (40 mg total) by mouth daily. 90 capsule 3  . phentermine (ADIPEX-P) 37.5 MG tablet Take 1 tablet by mouth daily.   5  . TRUVADA 200-300 MG tablet Take 1 tablet by mouth daily.  5   No current facility-administered medications on file prior to visit.     Review of Systems:  As per HPI- otherwise negative. No ST   Physical Examination: Vitals:   01/18/17 1343  BP: 132/82  Pulse: 81  Temp: 98.4 F (36.9 C)  SpO2: 98%   Vitals:   01/18/17 1343  Weight: 257 lb 6.4 oz (116.8 kg)  Height: 5' 11"  (1.803 m)   Body mass index is 35.9 kg/m. Ideal Body Weight: Weight in (lb) to have BMI = 25: 178.9  GEN: WDWN, NAD, Non-toxic, A & O x 3, overweight, looks well HEENT: Atraumatic, Normocephalic. Neck supple. No masses, No LAD.  Bilateral TM wnl, oropharynx normal.  PEERL,EOMI.   Ears and Nose: No external deformity. CV: RRR, No M/G/R. No JVD. No thrill. No extra heart sounds. PULM: CTA B, no wheezes, crackles, rhonchi. No retractions. No resp. distress. No accessory muscle use. ABD: S, NT, ND, +BS. No rebound. No HSM.  Benign belly EXTR: No c/c/e NEURO Normal gait.  PSYCH: Normally interactive. Conversant. Not depressed or anxious appearing.  Calm demeanor.   EKG: compared with previous from 2011 No change, normal EKG  Assessment and Plan: Gastroesophageal reflux disease, esophagitis presence not specified - Plan: sucralfate (CARAFATE) 1 g tablet, omeprazole (PRILOSEC) 40 MG capsule  Chest pain, unspecified type - Plan: EKG 12-Lead, Troponin I here today with likely GERD related chest and throat discomfort.  He got substantial relief from a GI cocktail EKG is reassuring Will also check troponin I for further assurance that this is non- cardiac Will start on prilosec 40 and carafate If sx do not resolve plan to follow-up with GI    Signed Lamar Blinks, MD

## 2017-01-26 ENCOUNTER — Ambulatory Visit: Payer: BLUE CROSS/BLUE SHIELD | Admitting: Physician Assistant

## 2017-01-29 ENCOUNTER — Ambulatory Visit: Payer: BLUE CROSS/BLUE SHIELD | Admitting: Family Medicine

## 2017-01-29 ENCOUNTER — Other Ambulatory Visit: Payer: Self-pay | Admitting: Family Medicine

## 2017-01-29 NOTE — Progress Notes (Deleted)
[place] at [facility] [address], [place], [place] [address] [phone]  Date:  01/29/2017   Name:  [place] Weber   DOB:  1975-05-25   MRN:  [id number]  PCP:  Darreld Mclean, MD    Chief Complaint: No chief complaint on file.   History of Present Illness:  [place] Weber is a 41 y.o. very pleasant male patient who presents with the following:  Pt of mine here today for a sick visit I saw him 2 weeks ago with apparent GERD chest pain that resolved with a GI cocktail    Patient Active Problem List   Diagnosis Date Noted  . Encounter for HIV counseling 11/28/2013  . Obesity 08/18/2013  . Tinea versicolor 05/19/2013  . Preventative health care 05/19/2013  . Hypertriglyceridemia 05/28/2012  . Hypogonadism male 06/07/2011  . General medical examination 08/05/2010  . Essential hypertension 09/21/2009  . 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 GERD 04/04/2007    Past Medical History:  Diagnosis Date  . Childhood asthma   . Chronic renal insufficiency   . Fracture of base of fifth metacarpal bone of right hand   . Hypertension   . Positive TB test     Past Surgical History:  Procedure Laterality Date  . HAND SURGERY  02/2016    Social History   Tobacco Use  . Smoking status: Never Smoker  . Smokeless tobacco: Never Used  Substance Use Topics  . Alcohol use: No    Alcohol/week: 0.0 oz  . Drug use: No    Family History  Problem Relation Age of Onset  . Kidney cancer Mother 2  . Cancer Mother   . Hypertension Father        age 109  . Diabetes Father   . Other Unknown        no colon, no prostate cancer  . Lung cancer Unknown        unlce    No Known Allergies  Medication list has been reviewed and updated.  Current Outpatient Medications on File Prior to Visit  Medication Sig Dispense Refill  . amLODipine (NORVASC) 5 MG tablet Take 1 tablet (5 mg total) by mouth  daily. 90 tablet 0  . cefdinir (OMNICEF) 300 MG capsule Take 1 capsule (300 mg total) by mouth 2 (two) times daily. 20 capsule 0  . clotrimazole-betamethasone (LOTRISONE) cream     . diclofenac (VOLTAREN) 75 MG EC tablet Take 1 tablet (75 mg total) 2 (two) times daily by mouth. 50 tablet 2  . dicyclomine (BENTYL) 20 MG tablet Take 1 tablet (20 mg total) by mouth 4 (four) times daily -  before meals and at bedtime. 60 tablet 0  . ipratropium (ATROVENT) 0.03 % nasal spray Place 2 sprays into the nose 4 (four) times daily. Use as needed for runny nose/ post nasal drip 30 mL 6  . olmesartan-hydrochlorothiazide (BENICAR HCT) 20-12.5 MG tablet TAKE 1 TABLET BY MOUTH ONCE DAILY 30 tablet 0  . omeprazole (PRILOSEC) 40 MG capsule Take 1 capsule (40 mg total) by mouth daily. 30 capsule 3  . phentermine (ADIPEX-P) 37.5 MG tablet Take 1 tablet by mouth daily.  5  . sucralfate (CARAFATE) 1 g tablet Take 1 tablet (1 g total) by mouth 4 (four) times daily -  with meals and at bedtime. Use for 7- 10 days 40 tablet 0  . TRUVADA 200-300 MG tablet Take 1  tablet by mouth daily.  5   No current facility-administered medications on file prior to visit.     Review of Systems:  As per HPI- otherwise negative.   Physical Examination: There were no vitals filed for this visit. There were no vitals filed for this visit. There is no height or weight on file to calculate BMI. Ideal Body Weight:    GEN: WDWN, NAD, Non-toxic, A & O x 3 HEENT: Atraumatic, Normocephalic. Neck supple. No masses, No LAD. Ears and Nose: No external deformity. CV: RRR, No M/G/R. No JVD. No thrill. No extra heart sounds. PULM: CTA B, no wheezes, crackles, rhonchi. No retractions. No resp. distress. No accessory muscle use. ABD: S, NT, ND, +BS. No rebound. No HSM. EXTR: No c/c/e NEURO Normal gait.  PSYCH: Normally interactive. Conversant. Not depressed or anxious appearing.  Calm demeanor.    Assessment and  Plan: ***  Signed Lamar Blinks, MD

## 2017-03-07 ENCOUNTER — Other Ambulatory Visit: Payer: Self-pay | Admitting: Family Medicine

## 2017-06-06 ENCOUNTER — Telehealth: Payer: Self-pay

## 2017-06-06 DIAGNOSIS — I10 Essential (primary) hypertension: Secondary | ICD-10-CM

## 2017-06-06 NOTE — Telephone Encounter (Signed)
Copied from [place] [id number]. Topic: Quick Communication - See Telephone Encounter >> Jun 06, 2017  4:50 PM Antonieta Iba C wrote: CRM for notification. See Telephone encounter for: 06/06/17.  Pt called in to follow up on request from pharmacy. He said that he was told by pharmacy that they need PCP okay to split medication Benicar due to pt not having insurance. Pt would like to know if PCP could okay this request from the pharmacy? Please advise.   Pharmacy: [place], [place] - [address] [phone] (Phone) [phone] (Fax)

## 2017-06-07 MED ORDER — HYDROCHLOROTHIAZIDE 12.5 MG PO CAPS
12.5000 mg | ORAL_CAPSULE | Freq: Every day | ORAL | 3 refills | Status: DC
Start: 2017-06-07 — End: 2017-11-09

## 2017-06-07 MED ORDER — OLMESARTAN MEDOXOMIL 20 MG PO TABS
20.0000 mg | ORAL_TABLET | Freq: Every day | ORAL | 3 refills | Status: DC
Start: 2017-06-07 — End: 2018-01-11

## 2017-06-07 NOTE — Telephone Encounter (Signed)
Called pt and LMOM- I can certainly split his rx into 2 separate meds for him. Will do so now Meds ordered this encounter  Medications  . olmesartan (BENICAR) 20 MG tablet    Sig: Take 1 tablet (20 mg total) by mouth daily.    Dispense:  90 tablet    Refill:  3  . hydrochlorothiazide (MICROZIDE) 12.5 MG capsule    Sig: Take 1 capsule (12.5 mg total) by mouth daily.    Dispense:  90 capsule    Refill:  3

## 2017-06-12 ENCOUNTER — Ambulatory Visit: Payer: Self-pay

## 2017-06-12 NOTE — Telephone Encounter (Signed)
Pt. called to report intermittent mid-chest pain that "fluctuates up and down".  Stated has had this intermittent pain about 1.5 weeks, that usually lasts up to 2 min., and resolves on it's own.  Today, denied any chest discomfort.  Denied any specific correlation with eating; stated "sometimes it occurs with eating and sometimes it  occurs without relation to eating."   Denied dizziness, nausea, vomiting, or shortness of breath.  Reported hx of upper endoscopy 03/2016, with diagnosis of "scarring of esophagus from acid reflux."  Reported he also had esophageal dilatation at that time.  Pt. also reported intermittent numbness in the left upper arm, near the shoulder; an episode lasts about 2 min.  Denied that the upper left arm numbness occurs simultaneously with chest discomfort; stated they occur at different times.  Advised to see MD within 24 hrs.  Pt. Stated he is unable to rescheduled meetings that he has to attend tomorrow and Thursday.  Appt. Given on 06/14/17 at 5:30 PM.  Advised to call back or go to ER if symptoms worsen. Verb. Understanding; agreed.        Reason for Disposition . [1] Chest pain lasting <= 5 minutes AND [2] NO chest pain or cardiac symptoms now(Exceptions: pains lasting a few seconds)  Answer Assessment - Initial Assessment Questions 1. LOCATION: "Where does it hurt?"       In the center of his chest; it moves up or down in the mid chest.  2. RADIATION: "Does the pain go anywhere else?" (e.g., into neck, jaw, arms, back)     Denied radiation of the mid chest pain  3. ONSET: "When did the chest pain begin?" (Minutes, hours or days)      Intermittent x 1.5 weeks 4. PATTERN "Does the pain come and go, or has it been constant since it started?"  "Does it get worse with exertion?"      Intermittent episodes; does not occur with exertion 5. DURATION: "How long does it last" (e.g., seconds, minutes, hours)     About 2 minutes induration; goes away on its own; takes an OTC acid  reflux medication 6. SEVERITY: "How bad is the pain?"  (e.g., Scale 1-10; mild, moderate, or severe)    - MILD (1-3): doesn't interfere with normal activities     - MODERATE (4-7): interferes with normal activities or awakens from sleep    - SEVERE (8-10): excruciating pain, unable to do any normal activities       5-6/10 7. CARDIAC RISK FACTORS: "Do you have any history of heart problems or risk factors for heart disease?" (e.g., prior heart attack, angina; high blood pressure, diabetes, being overweight, high cholesterol, smoking, or strong family history of heart disease)     Denied any cardiac hx personally or within family; hx of HTN ; is overweight; denied smoking  8. PULMONARY RISK FACTORS: "Do you have any history of lung disease?"  (e.g., blood clots in lung, asthma, emphysema, birth control pills)     Asthma; denied PE, COPD 9. CAUSE: "What do you think is causing the chest pain?"    Acid reflux; sometimes it can occur with eating, and sometimes without any relation to eating.  10. OTHER SYMPTOMS: "Do you have any other symptoms?" (e.g., dizziness, nausea, vomiting, sweating, fever, difficulty breathing, cough)       Denied dizziness, nausea, vomiting, shortness of breath; c/o headaches daily.   11. PREGNANCY: "Is there any chance you are pregnant?" "When was your last menstrual period?"  n/a  Protocols used: CHEST PAIN-A-AH

## 2017-06-14 ENCOUNTER — Encounter: Payer: Self-pay | Admitting: Family Medicine

## 2017-06-14 ENCOUNTER — Ambulatory Visit (INDEPENDENT_AMBULATORY_CARE_PROVIDER_SITE_OTHER): Payer: Self-pay | Admitting: Family Medicine

## 2017-06-14 VITALS — BP 126/80 | HR 74 | Resp 16 | Ht 71.0 in | Wt 267.0 lb

## 2017-06-14 DIAGNOSIS — Z13220 Encounter for screening for lipoid disorders: Secondary | ICD-10-CM

## 2017-06-14 DIAGNOSIS — K2100 Gastro-esophageal reflux disease with esophagitis, without bleeding: Secondary | ICD-10-CM

## 2017-06-14 DIAGNOSIS — R079 Chest pain, unspecified: Secondary | ICD-10-CM

## 2017-06-14 DIAGNOSIS — Z5181 Encounter for therapeutic drug level monitoring: Secondary | ICD-10-CM

## 2017-06-14 DIAGNOSIS — K222 Esophageal obstruction: Secondary | ICD-10-CM

## 2017-06-14 DIAGNOSIS — Z131 Encounter for screening for diabetes mellitus: Secondary | ICD-10-CM

## 2017-06-14 DIAGNOSIS — R0789 Other chest pain: Secondary | ICD-10-CM

## 2017-06-14 DIAGNOSIS — K219 Gastro-esophageal reflux disease without esophagitis: Secondary | ICD-10-CM

## 2017-06-14 MED ORDER — OMEPRAZOLE 40 MG PO CPDR: 40.0000 mg | DELAYED_RELEASE_CAPSULE | Freq: Every day | ORAL | 3 refills | Status: DC

## 2017-06-14 NOTE — Patient Instructions (Addendum)
It was a pleasure to see you in the office today as always We will get basic labs for you today and I will be in touch with your resutls I am going to add omeprazole back to your regimen- however you can also continue the famotidine if you like Please call your GI doc Ardis Hughs) and schedule a follow-up visit asap We will also get a treadmill exercise stress test to make sure that you heart looks ok If you are getting worse in any way please seek care with me or the ER right away!   Take care

## 2017-06-14 NOTE — Progress Notes (Addendum)
[place] at [facility] [address] [place], [place] [address] [phone]  Date:  06/14/2017   Name:  Erik Weber   DOB:  03-20-1975   MRN:  [id number]  PCP:  Erik Mclean, MD    Chief Complaint: Chest Pain   History of Present Illness:  LEMON[place] Weber is a 42 y.o. very pleasant male patient who presents with the following: History of obesity, HTN Last seen here in December with sx apparently due to [place] [facility]: Chest pain, unspecified type - Plan: EKG 12-Lead, Troponin I here today with likely GERD related chest and throat discomfort.  He got substantial relief from a GI cocktail EKG is reassuring Will also check troponin I for further assurance that this is non- cardiac Will start on prilosec 40 and carafate If sx do not resolve plan to follow-up with GI   He called on 5/7 with the following: Pt. called to report intermittent mid-chest pain that "fluctuates up and down".  Stated has had this intermittent pain about 1.5 weeks, that usually lasts up to 2 min., and resolves on it's own.  Today, denied any chest discomfort.  Denied any specific correlation with eating; stated "sometimes it occurs with eating and sometimes it  occurs without relation to eating."   Denied dizziness, nausea, vomiting, or shortness of breath.  Reported hx of upper endoscopy 03/2016, with diagnosis of "scarring of esophagus from acid reflux."  Reported he also had esophageal dilatation at that time.  Pt. also reported intermittent numbness in the left upper arm, near the shoulder; an episode lasts about 2 min.  Denied that the upper left arm numbness occurs simultaneously with chest discomfort; stated they occur at different times.  Advised to see MD within 24 hrs.  Pt. Stated he is unable to rescheduled meetings that he has to attend tomorrow and Thursday.  Appt. Given on 06/14/17 at 5:30 PM.  Advised to call back or go to ER if symptoms worsen. Verb.  Understanding; agreed.   He has had an esophageal dilation in 03/2016, and did well for about a year after this.  He has noted difficulty swallowing again- things are getting stuck in his throat, he sometimes cannot get food down.  He has noted this again for about one month now  Just solids- not liquids- are getting stuck.  Chicken is the worst  Sometime he can wash the lodged food down, but sometimes he has to cough the food back up He is taking OTC famotidine right now - he is not on a PPI currently  He notes a burning in his throat and upper chest for about a month as well He notes this most of the time recently- it is pretty constant, does not vary with exertion or anything else  Not worse after eating  He is actually ok supine No SOB  Asked about any other CP or SOB.  No SOB, but admits that he will feel some pressure in his chest at times which he has noted for 3-4 weeks This sx is non exertional He may notice it 3-4 a week, it may last a couple of minutes Last occurred several days ago He does not have any family or personal history of CAD  He has not seen cardiology as of yet He does not have a GI appt as of yet   BP Readings from Last 3 Encounters:  06/14/17 126/80  01/18/17 132/82  12/13/16 Marland Kitchen)  136/98   He has noted his BP running a little high at home- up to 150/98 max  He is on amlodipine, hctz, olmesartan No weight loss No bowel changes No vomiting up or passing any blood No fever   Patient Active Problem List   Diagnosis Date Noted  . Obesity 08/18/2013  . Tinea versicolor 05/19/2013  . Hypertriglyceridemia 05/28/2012  . Hypogonadism male 06/07/2011  . Essential hypertension 09/21/2009  . 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 GERD 04/04/2007    Past Medical History:  Diagnosis Date  . Childhood asthma   . Chronic renal insufficiency   . Fracture of base of fifth metacarpal bone of right hand   . Hypertension   . Positive TB  test     Past Surgical History:  Procedure Laterality Date  . HAND SURGERY  02/2016    Social History   Tobacco Use  . Smoking status: Never Smoker  . Smokeless tobacco: Never Used  Substance Use Topics  . Alcohol use: No    Alcohol/week: 0.0 oz  . Drug use: No    Family History  Problem Relation Age of Onset  . Kidney cancer Mother 7  . Cancer Mother   . Hypertension Father        age 69  . Diabetes Father   . Other Unknown        no colon, no prostate cancer  . Lung cancer Unknown        unlce    No Known Allergies  Medication list has been reviewed and updated.  Current Outpatient Medications on File Prior to Visit  Medication Sig Dispense Refill  . amLODipine (NORVASC) 5 MG tablet TAKE 1 TABLET BY MOUTH ONCE DAILY 90 tablet 1  . cefdinir (OMNICEF) 300 MG capsule Take 1 capsule (300 mg total) by mouth 2 (two) times daily. 20 capsule 0  . clotrimazole-betamethasone (LOTRISONE) cream     . diclofenac (VOLTAREN) 75 MG EC tablet Take 1 tablet (75 mg total) 2 (two) times daily by mouth. 50 tablet 2  . dicyclomine (BENTYL) 20 MG tablet Take 1 tablet (20 mg total) by mouth 4 (four) times daily -  before meals and at bedtime. 60 tablet 0  . hydrochlorothiazide (MICROZIDE) 12.5 MG capsule Take 1 capsule (12.5 mg total) by mouth daily. 90 capsule 3  . ipratropium (ATROVENT) 0.03 % nasal spray Place 2 sprays into the nose 4 (four) times daily. Use as needed for runny nose/ post nasal drip 30 mL 6  . olmesartan (BENICAR) 20 MG tablet Take 1 tablet (20 mg total) by mouth daily. 90 tablet 3  . omeprazole (PRILOSEC) 40 MG capsule Take 1 capsule (40 mg total) by mouth daily. 30 capsule 3  . phentermine (ADIPEX-P) 37.5 MG tablet Take 1 tablet by mouth daily.  5  . sucralfate (CARAFATE) 1 g tablet Take 1 tablet (1 g total) by mouth 4 (four) times daily -  with meals and at bedtime. Use for 7- 10 days 40 tablet 0  . TRUVADA 200-300 MG tablet Take 1 tablet by mouth daily.  5   No  current facility-administered medications on file prior to visit.     Review of Systems:  As per HPI- otherwise negative.   Physical Examination: Vitals:   06/14/17 1605  BP: 126/80  Pulse: 74  Resp: 16  SpO2: 98%   Vitals:   06/14/17 1605  Weight: 267 lb (121.1 kg)  Height: 5' 11"  (1.803 m)  Body mass index is 37.24 kg/m. Ideal Body Weight: Weight in (lb) to have BMI = 25: 178.9  GEN: WDWN, NAD, Non-toxic, A & O x 3, obese, looks well  HEENT: Atraumatic, Normocephalic. Neck supple. No masses, No LAD.  Bilateral TM wnl, oropharynx normal.  PEERL,EOMI.   Ears and Nose: No external deformity. CV: RRR, No M/G/R. No JVD. No thrill. No extra heart sounds. PULM: CTA B, no wheezes, crackles, rhonchi. No retractions. No resp. distress. No accessory muscle use. ABD: S, NT, ND, +BS. No rebound. No HSM. EXTR: No c/c/e NEURO Normal gait.  PSYCH: Normally interactive. Conversant. Not depressed or anxious appearing.  Calm demeanor.  Pt declined a GI cocktail today  EKG: NSR.  Normal EKG When compared with EKG from December it is virtually identical  Assessment and Plan: Chest pain, unspecified type - Plan: EKG 12-Lead  Chest pressure - Plan: Exercise Tolerance Test  Gastroesophageal reflux disease with esophagitis  Esophageal stricture  Screening for diabetes mellitus - Plan: Hemoglobin A1c  Screening for hyperlipidemia - Plan: Lipid panel  Medication monitoring encounter - Plan: CBC, Comprehensive metabolic panel  Gastroesophageal reflux disease, esophagitis presence not specified - Plan: omeprazole (PRILOSEC) 40 MG capsule  Here today with a constant burning chest discomfort for about one month, and complaint of food getting caught in his throat.  Suspect a GI etiology Will add a PPI He will contact his GI doc asap Pt also notes an episodic chest pressure which is not present now.  Discussed in detail with pt.  This may certainly also be related to his GI issues but  will obtain an ETT to further risk stratify.   He will try a baby aspirin for now, but watch for worsening of GI symptoms   Signed Lamar Blinks, MD  Received his labs 5/11 Message to pt: Blood count is normal Metabolic profile is normal except for slightly high creatinine- a marker of kidney function. However, I think that you have been known to have less than 100% kidney function for a while, and your current numbers are better than in the recent past A1c does not show any sign of diabetes I am a bit concerned about your cholesterol; your triglycerides are high and HDL is low, which may increase your long term risk of heart disease.  I would like to try a cholesterol med for you- let me know if this would be ok Results for orders placed or performed in visit on 06/14/17  CBC  Result Value Ref Range   WBC 9.4 4.0 - 10.5 K/uL   RBC 5.40 4.22 - 5.81 Mil/uL   Platelets 256.0 150.0 - 400.0 K/uL   Hemoglobin 15.7 13.0 - 17.0 g/dL   HCT 46.7 39.0 - 52.0 %   MCV 86.6 78.0 - 100.0 fl   MCHC 33.5 30.0 - 36.0 g/dL   RDW 13.2 11.5 - 15.5 %  Comprehensive metabolic panel  Result Value Ref Range   Sodium 138 135 - 145 mEq/L   Potassium 4.2 3.5 - 5.1 mEq/L   Chloride 101 96 - 112 mEq/L   CO2 30 19 - 32 mEq/L   Glucose, Bld 90 70 - 99 mg/dL   BUN 15 6 - 23 mg/dL   Creatinine, Ser 1.55 (H) 0.40 - 1.50 mg/dL   Total Bilirubin 0.5 0.2 - 1.2 mg/dL   Alkaline Phosphatase 56 39 - 117 U/L   AST 18 0 - 37 U/L   ALT 22 0 - 53 U/L  Total Protein 7.4 6.0 - 8.3 g/dL   Albumin 4.4 3.5 - 5.2 g/dL   Calcium 9.5 8.4 - 10.5 mg/dL   GFR 63.34 >60.00 mL/min  Hemoglobin A1c  Result Value Ref Range   Hgb A1c MFr Bld 4.7 4.6 - 6.5 %  Lipid panel  Result Value Ref Range   Cholesterol 195 0 - 200 mg/dL   Triglycerides 398.0 (H) 0.0 - 149.0 mg/dL   HDL 32.20 (L) >39.00 mg/dL   VLDL 79.6 (H) 0.0 - 40.0 mg/dL   Total CHOL/HDL Ratio 6    NonHDL 163.04   LDL cholesterol, direct  Result Value Ref Range    Direct LDL 97.0 mg/dL

## 2017-06-15 LAB — COMPREHENSIVE METABOLIC PANEL
ALK PHOS: 56 U/L (ref 39–117)
ALT: 22 U/L (ref 0–53)
AST: 18 U/L (ref 0–37)
Albumin: 4.4 g/dL (ref 3.5–5.2)
BUN: 15 mg/dL (ref 6–23)
CALCIUM: 9.5 mg/dL (ref 8.4–10.5)
CO2: 30 mEq/L (ref 19–32)
CREATININE: 1.55 mg/dL — AB (ref 0.40–1.50)
Chloride: 101 mEq/L (ref 96–112)
GFR: 63.34 mL/min (ref >60.00)
Glucose, Bld: 90 mg/dL (ref 70–99)
POTASSIUM: 4.2 meq/L (ref 3.5–5.1)
Sodium: 138 mEq/L (ref 135–145)
TOTAL PROTEIN: 7.4 g/dL (ref 6.0–8.3)
Total Bilirubin: 0.5 mg/dL (ref 0.2–1.2)

## 2017-06-15 LAB — CBC
HEMATOCRIT: 46.7 % (ref 39.0–52.0)
HEMOGLOBIN: 15.7 g/dL (ref 13.0–17.0)
MCHC: 33.5 g/dL (ref 30.0–36.0)
MCV: 86.6 fl (ref 78.0–100.0)
PLATELETS: 256 10*3/uL (ref 150.0–400.0)
RBC: 5.4 Mil/uL (ref 4.22–5.81)
RDW: 13.2 % (ref 11.5–15.5)
WBC: 9.4 10*3/uL (ref 4.0–10.5)

## 2017-06-15 LAB — LIPID PANEL
Cholesterol: 195 mg/dL (ref 0–200)
HDL: 32.2 mg/dL — ABNORMAL LOW (ref >39.00)
NONHDL: 163.04
Total CHOL/HDL Ratio: 6
Triglycerides: 398 mg/dL — ABNORMAL HIGH (ref 0.0–149.0)
VLDL: 79.6 mg/dL — ABNORMAL HIGH (ref 0.0–40.0)

## 2017-06-15 LAB — LDL CHOLESTEROL, DIRECT: LDL DIRECT: 97 mg/dL

## 2017-06-15 LAB — HEMOGLOBIN A1C: Hgb A1c MFr Bld: 4.7 % (ref 4.6–6.5)

## 2017-06-16 ENCOUNTER — Encounter: Payer: Self-pay | Admitting: Family Medicine

## 2017-06-30 ENCOUNTER — Other Ambulatory Visit: Payer: Self-pay | Admitting: Family Medicine

## 2017-10-03 ENCOUNTER — Other Ambulatory Visit: Payer: Self-pay | Admitting: Family Medicine

## 2017-10-03 NOTE — Progress Notes (Deleted)
[place] at [facility] [address] [place], [place] [address] [phone]  Date:  10/04/2017   Name:  Erik Weber   DOB:  02-01-1976   MRN:  [id number]  PCP:  Darreld Mclean, MD    Chief Complaint: No chief complaint on file.   History of Present Illness:  Erik Weber is a 42 y.o. very pleasant male patient who presents with the following:  History of HTN, hypogonadism, dyslipidemia Here today with concern of  I last saw him in May and ordered an ETT for cardiac symptoms but I don't think he ever got this done  Patient Active Problem List   Diagnosis Date Noted  . Obesity 08/18/2013  . Tinea versicolor 05/19/2013  . Hypertriglyceridemia 05/28/2012  . Hypogonadism male 06/07/2011  . Essential hypertension 09/21/2009  . 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 GERD 04/04/2007    Past Medical History:  Diagnosis Date  . Childhood asthma   . Chronic renal insufficiency   . Fracture of base of fifth metacarpal bone of right hand   . Hypertension   . Positive TB test     Past Surgical History:  Procedure Laterality Date  . HAND SURGERY  02/2016    Social History   Tobacco Use  . Smoking status: Never Smoker  . Smokeless tobacco: Never Used  Substance Use Topics  . Alcohol use: No    Alcohol/week: 0.0 standard drinks  . Drug use: No    Family History  Problem Relation Age of Onset  . Kidney cancer Mother 54  . Cancer Mother   . Hypertension Father        age 57  . Diabetes Father   . Other Unknown        no colon, no prostate cancer  . Lung cancer Unknown        unlce    No Known Allergies  Medication list has been reviewed and updated.  Current Outpatient Medications on File Prior to Visit  Medication Sig Dispense Refill  . amLODipine (NORVASC) 5 MG tablet TAKE 1 TABLET BY MOUTH ONCE DAILY 90 tablet 1  . cefdinir (OMNICEF) 300 MG capsule Take 1 capsule (300 mg  total) by mouth 2 (two) times daily. 20 capsule 0  . clotrimazole-betamethasone (LOTRISONE) cream     . diclofenac (VOLTAREN) 75 MG EC tablet Take 1 tablet (75 mg total) 2 (two) times daily by mouth. 50 tablet 2  . dicyclomine (BENTYL) 20 MG tablet Take 1 tablet (20 mg total) by mouth 4 (four) times daily -  before meals and at bedtime. 60 tablet 0  . hydrochlorothiazide (MICROZIDE) 12.5 MG capsule Take 1 capsule (12.5 mg total) by mouth daily. 90 capsule 3  . ipratropium (ATROVENT) 0.03 % nasal spray Place 2 sprays into the nose 4 (four) times daily. Use as needed for runny nose/ post nasal drip 30 mL 6  . olmesartan (BENICAR) 20 MG tablet Take 1 tablet (20 mg total) by mouth daily. 90 tablet 3  . omeprazole (PRILOSEC) 40 MG capsule Take 1 capsule (40 mg total) by mouth daily. 30 capsule 3  . phentermine (ADIPEX-P) 37.5 MG tablet Take 1 tablet by mouth daily.  5  . sucralfate (CARAFATE) 1 g tablet Take 1 tablet (1 g total) by mouth 4 (four) times daily -  with meals and at bedtime. Use for 7- 10 days 40 tablet 0  . TRUVADA  200-300 MG tablet Take 1 tablet by mouth daily.  5   No current facility-administered medications on file prior to visit.     Review of Systems:  As per HPI- otherwise negative.   Physical Examination: There were no vitals filed for this visit. There were no vitals filed for this visit. There is no height or weight on file to calculate BMI. Ideal Body Weight:    GEN: WDWN, NAD, Non-toxic, A & O x 3 HEENT: Atraumatic, Normocephalic. Neck supple. No masses, No LAD. Ears and Nose: No external deformity. CV: RRR, No M/G/R. No JVD. No thrill. No extra heart sounds. PULM: CTA B, no wheezes, crackles, rhonchi. No retractions. No resp. distress. No accessory muscle use. ABD: S, NT, ND, +BS. No rebound. No HSM. EXTR: No c/c/e NEURO Normal gait.  PSYCH: Normally interactive. Conversant. Not depressed or anxious appearing.  Calm demeanor.    Assessment and  Plan: ***  Signed Lamar Blinks, MD

## 2017-10-04 ENCOUNTER — Ambulatory Visit: Payer: Self-pay | Admitting: Family Medicine

## 2017-10-24 ENCOUNTER — Ambulatory Visit (INDEPENDENT_AMBULATORY_CARE_PROVIDER_SITE_OTHER): Payer: BLUE CROSS/BLUE SHIELD | Admitting: Nurse Practitioner

## 2017-10-24 ENCOUNTER — Encounter: Payer: Self-pay | Admitting: Nurse Practitioner

## 2017-10-24 VITALS — BP 126/80 | HR 84 | Ht 71.0 in | Wt 274.6 lb

## 2017-10-24 DIAGNOSIS — R1310 Dysphagia, unspecified: Secondary | ICD-10-CM

## 2017-10-24 DIAGNOSIS — K219 Gastro-esophageal reflux disease without esophagitis: Secondary | ICD-10-CM | POA: Diagnosis not present

## 2017-10-24 MED ORDER — RANITIDINE HCL 300 MG PO TABS: 300.0000 mg | ORAL_TABLET | Freq: Every day | ORAL | 3 refills | Status: DC

## 2017-10-24 NOTE — Patient Instructions (Addendum)
If you are age 43 or older, your body mass index should be between 23-30. Your Body mass index is 38.3 kg/m. If this is out of the aforementioned range listed, please consider follow up with your Primary Care Provider.  If you are age 92 or younger, your body mass index should be between 19-25. Your Body mass index is 38.3 kg/m. If this is out of the aformentioned range listed, please consider follow up with your Primary Care Provider.   You have been scheduled for an endoscopy. Please follow written instructions given to you at your visit today. If you use inhalers (even only as needed), please bring them with you on the day of your procedure. Your physician has requested that you go to www.startemmi.com and enter the access code given to you at your visit today. This web site gives a general overview about your procedure. However, you should still follow specific instructions given to you by our office regarding your preparation for the procedure.  HOLD PHENTERMINE FOR 10 DAYS PRIOR TO PROCEDURE.  We have sent the following medications to your pharmacy for you to pick up at your convenience: Zantac  300 mg Continue Omeprazole 40 mg  Thank you for choosing me and [facility].   Tye Savoy, NP  .

## 2017-10-24 NOTE — Progress Notes (Signed)
Primary GI:  Oretha Caprice, MD  Chief Complaint:   GERD. Medication concerns   IMPRESSION and PLAN:    49. 42 yo male with breakthrough GERD sx   / recurrent dysphagia.  He had esophageal stenosis on EGD December 2017 , status post dilation with about a 1 year reprieve in dysphagia. Now having frequent heartburn / globus sensation as well as frequent solid food dysphagia.  Dysphagia worse than when I saw him in 2017 -We will arrange for repeat EGD with probable dilation. The risks and benefits of EGD were discussed and the patient agrees to proceed.  -Advised patient to eat small bites, chew well with liquids in between bites to avoid food impaction. -Hold phentermine 10 days prior to procedure  2. Medication concerns.  Patient concerned about long-term use of PPIs .  Other took PPI for a long time and she was recently diagnosed with kidney cancer  -Explained to patient that some studies have shown an association between PPIs and acute nephritis and / or CKD.  I do not know that there is been an association between PPIs and kidney cancer however.  I understand the patient's concern especially since he already has CKD.  Having said that, the benefits of PPI therapy seem to outweigh the risk in this patient at this point.  He has room for improvement in the area of lifestyle modification such as reduction of caffeine consumption and weight loss, both of which may help with GERD symptoms.  -For now we will continue omeprazole 40 mg every morning 30 minutes before breakfast.  He is already taking over-the-counter Zantac about 4 nights a week.  Instead of increasing his PPI will increase Zantac to 300 mg nightly.  Other evaluation at time of EGD  3. CKD, baseline Cr 1.55-1.7  4. Obesity, on phentermine.    HPI:     Patient is a 42 year old male who I saw December 2017 for evaluation of GERD and dysphagia.  He subsequently underwent EGD with findings as below  EGD Dec 2017.  -Benign-appearing  esophageal stenosis. Dilated. - The examination was otherwise normal. - Biopsies were obtained in the proximal esophagus and in the distal esophagus to check for signs of Eosinophilic Esophagitis.  Biopsies show changes of reflux, no dysplasia no EoE  Patient on Omeprazole , patient worried about side effects. Mother took PPI and recently diagnosed with renal cancer. Patient concerned about being on PPIs for so long.  His symptoms are not well controlled on every morning omeprazole.  He has been taking Zantac, over-the-counter dose, about 4 times a week.  He tends to drink several caffeinated beverages a day but does go to bed on an empty stomach.  Sleeps on pillows.  He describes frequent heartburn and globus sensation.  Additionally, solid food dysphagia began recurring about a year ago.  Meat is the main culprit.  Occasionally has discomfort with swallowing.  No lower GI complaints  Review of systems:     No chest pain, no SOB, no fevers, no urinary sx   Past Medical History:  Diagnosis Date  . Childhood asthma   . Chronic renal insufficiency   . Fracture of base of fifth metacarpal bone of right hand   . GERD (gastroesophageal reflux disease)   . Hypertension   . Positive TB test     Patient's surgical history, family medical history, social history, medications and allergies were all reviewed in Epic   Creatinine clearance cannot be calculated (Patient's  most recent lab result is older than the maximum 21 days allowed.)  Current Outpatient Medications  Medication Sig Dispense Refill  . amLODipine (NORVASC) 5 MG tablet TAKE 1 TABLET BY MOUTH ONCE DAILY 90 tablet 1  . olmesartan (BENICAR) 20 MG tablet Take 1 tablet (20 mg total) by mouth daily. 90 tablet 3  . omeprazole (PRILOSEC) 40 MG capsule Take 1 capsule (40 mg total) by mouth daily. 30 capsule 3  . phentermine (ADIPEX-P) 37.5 MG tablet Take 1 tablet by mouth daily.  5  . hydrochlorothiazide (MICROZIDE) 12.5 MG capsule Take 1  capsule (12.5 mg total) by mouth daily. (Patient not taking: Reported on 10/24/2017) 90 capsule 3   No current facility-administered medications for this visit.     Physical Exam:     Ht 5' 11"  (1.803 m)   Wt 274 lb 9.6 oz (124.6 kg)   BMI 38.30 kg/m   GENERAL:  Pleasant male in NAD PSYCH: : Cooperative, normal affect EENT:  conjunctiva pink, mucous membranes moist, neck supple without masses CARDIAC:  RRR, no murmur heard, no peripheral edema PULM: Normal respiratory effort, lungs CTA bilaterally, no wheezing ABDOMEN:  Nondistended, soft, nontender. No obvious masses, no hepatomegaly,  normal bowel sounds SKIN:  turgor, no lesions seen Musculoskeletal:  Normal muscle tone, normal strength NEURO: Alert and oriented x 3, no focal neurologic deficits   Tye Savoy , NP 10/24/2017, 10:34 AM

## 2017-10-26 ENCOUNTER — Encounter: Payer: Self-pay | Admitting: Nurse Practitioner

## 2017-10-26 NOTE — Progress Notes (Signed)
I agree with the above note, plan 

## 2017-11-02 DIAGNOSIS — I10 Essential (primary) hypertension: Secondary | ICD-10-CM | POA: Diagnosis not present

## 2017-11-02 DIAGNOSIS — K210 Gastro-esophageal reflux disease with esophagitis: Secondary | ICD-10-CM | POA: Diagnosis not present

## 2017-11-02 DIAGNOSIS — E7800 Pure hypercholesterolemia, unspecified: Secondary | ICD-10-CM | POA: Diagnosis not present

## 2017-11-09 ENCOUNTER — Encounter: Payer: Self-pay | Admitting: Gastroenterology

## 2017-11-09 ENCOUNTER — Telehealth: Payer: Self-pay

## 2017-11-09 ENCOUNTER — Ambulatory Visit: Payer: BLUE CROSS/BLUE SHIELD | Admitting: Gastroenterology

## 2017-11-09 VITALS — BP 148/85 | HR 69 | Temp 98.9°F | Ht 71.0 in | Wt 274.0 lb

## 2017-11-09 MED ORDER — SODIUM CHLORIDE 0.9 % IV SOLN: 500.0000 mL | INTRAVENOUS | Status: DC

## 2017-11-09 NOTE — Progress Notes (Signed)
Pt's states no medical or surgical changes since previsit or office visit. 

## 2017-11-09 NOTE — Telephone Encounter (Signed)
Will call next week

## 2017-11-09 NOTE — Progress Notes (Signed)
Unable to get IV access in [facility] admitting despite mult attempt by mult people, multiple sites.  I apologized to him for the trouble. Will plan on rescheduling the case at hospital in next 2-3 weeks.

## 2017-11-09 NOTE — Telephone Encounter (Signed)
-----   Message from Milus Banister, MD sent at 11/09/2017  2:11 PM EDT ----- Deneise Lever could not get an IV in him today. Can you call him early next week about rescheduling his EGD with dilation at [facility], my next available spot.  MAke sure they know about the difficult access, I advise he arrives 2-2 1/2 hours prior to the case to ensure enough time to get IV team involved.  Thanks

## 2017-11-09 NOTE — Progress Notes (Signed)
Received ok to use feet for IV attempt per Dr. Ardis Hughs

## 2017-11-13 ENCOUNTER — Other Ambulatory Visit (HOSPITAL_COMMUNITY): Payer: Self-pay | Admitting: General Surgery

## 2017-11-16 NOTE — Telephone Encounter (Signed)
Left message on machine to call back  Possible date for EGD 12/20/17 930 am  Need 2 1/2 hour case for difficult IV access

## 2017-11-16 NOTE — Telephone Encounter (Signed)
Left message on machine to call back  

## 2017-11-19 ENCOUNTER — Other Ambulatory Visit: Payer: Self-pay

## 2017-11-19 DIAGNOSIS — R1310 Dysphagia, unspecified: Secondary | ICD-10-CM

## 2017-11-19 NOTE — Telephone Encounter (Signed)
Left message on machine to call back  

## 2017-11-20 NOTE — Telephone Encounter (Signed)
Spoke with the pt and he was busy and will call back.

## 2017-11-21 NOTE — Telephone Encounter (Signed)
Detailed message left that instructions will be mailed to the pt home with appt date and time for Endoscopy

## 2017-11-25 NOTE — Progress Notes (Deleted)
[place] at [facility] [address], [place], [place] [address] [phone]  Date:  11/29/2017   Name:  Erik Weber   DOB:  1976-01-10   MRN:  [id number]  PCP:  Darreld Mclean, MD    Chief Complaint: No chief complaint on file.   History of Present Illness:  Erik Weber is a 42 y.o. very pleasant male patient who presents with the following:  Here today for a CPE History of obesity, HTN, GERD He has been trying to lose weight  I last saw him in May:  Here today with a constant burning chest discomfort for about one month, and complaint of food getting caught in his throat.  Suspect a GI etiology Will add a PPI He will contact his GI doc asap Pt also notes an episodic chest pressure which is not present now.  Discussed in detail with pt.  This may certainly also be related to his GI issues but will obtain an ETT to further risk stratify.   He will try a baby aspirin for now, but watch for worsening of GI symptoms  He has an EGD coming up next month  Labs: full done in May Immun: flu, tetanus coming due in [place]- ?boost today Patient Active Problem List   Diagnosis Date Noted  . Obesity 08/18/2013  . Tinea versicolor 05/19/2013  . Hypertriglyceridemia 05/28/2012  . Hypogonadism male 06/07/2011  . Essential hypertension 09/21/2009  . 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 GERD 04/04/2007    Past Medical History:  Diagnosis Date  . Childhood asthma   . Chronic renal insufficiency   . Fracture of base of fifth metacarpal bone of right hand   . GERD (gastroesophageal reflux disease)   . Hypertension   . Positive TB test     Past Surgical History:  Procedure Laterality Date  . HAND SURGERY Right 02/2016    Social History   Tobacco Use  . Smoking status: Never Smoker  . Smokeless tobacco: Never Used  Substance Use Topics  . Alcohol use: No    Alcohol/week: 0.0 standard  drinks  . Drug use: No    Family History  Problem Relation Age of Onset  . Kidney cancer Mother 83  . Hypertension Father        age 27  . Diabetes Father   . Lung cancer Paternal Uncle   . Colon cancer Neg Hx   . Prostate cancer Neg Hx   . Liver disease Neg Hx   . Esophageal cancer Neg Hx   . Stomach cancer Neg Hx   . Pancreatic cancer Neg Hx     No Known Allergies  Medication list has been reviewed and updated.  Current Outpatient Medications on File Prior to Visit  Medication Sig Dispense Refill  . amLODipine (NORVASC) 5 MG tablet TAKE 1 TABLET BY MOUTH ONCE DAILY 90 tablet 1  . olmesartan (BENICAR) 20 MG tablet Take 1 tablet (20 mg total) by mouth daily. 90 tablet 3  . omeprazole (PRILOSEC) 40 MG capsule Take 1 capsule (40 mg total) by mouth daily. 30 capsule 3  . phentermine (ADIPEX-P) 37.5 MG tablet Take 1 tablet by mouth daily.  5  . ranitidine (ZANTAC) 300 MG tablet Take 1 tablet (300 mg total) by mouth at bedtime. 30 tablet 3   Current Facility-Administered Medications on File Prior to Visit  Medication Dose Route Frequency Provider Last Rate Last  Dose  . 0.9 %  sodium chloride infusion  500 mL Intravenous Continuous Milus Banister, MD        Review of Systems:  As per HPI- otherwise negative.   Physical Examination: There were no vitals filed for this visit. There were no vitals filed for this visit. There is no height or weight on file to calculate BMI. Ideal Body Weight:    GEN: WDWN, NAD, Non-toxic, A & O x 3 HEENT: Atraumatic, Normocephalic. Neck supple. No masses, No LAD. Ears and Nose: No external deformity. CV: RRR, No M/G/R. No JVD. No thrill. No extra heart sounds. PULM: CTA B, no wheezes, crackles, rhonchi. No retractions. No resp. distress. No accessory muscle use. ABD: S, NT, ND, +BS. No rebound. No HSM. EXTR: No c/c/e NEURO Normal gait.  PSYCH: Normally interactive. Conversant. Not depressed or anxious appearing.  Calm demeanor.     Assessment and Plan: ***  Signed Lamar Blinks, MD

## 2017-11-29 ENCOUNTER — Telehealth: Payer: Self-pay

## 2017-11-29 ENCOUNTER — Encounter: Payer: BLUE CROSS/BLUE SHIELD | Admitting: Family Medicine

## 2017-11-29 NOTE — Telephone Encounter (Signed)
Copied from [place] [id number]. Topic: Quick Communication - See Telephone Encounter >> Nov 29, 2017  7:33 AM Gardiner Ramus wrote: CRM for notification. See Telephone encounter for: 11/29/17. Pt called and stated that he needs a physical before 12/17/17 for insurance purposes. Could we get him worked in. Please advise [phone]

## 2017-11-29 NOTE — Telephone Encounter (Signed)
Will schedule if ok?

## 2017-11-29 NOTE — Telephone Encounter (Signed)
sure

## 2017-11-30 ENCOUNTER — Ambulatory Visit: Payer: BLUE CROSS/BLUE SHIELD | Admitting: Dietician

## 2017-11-30 NOTE — Telephone Encounter (Signed)
Erik Weber has override-added patient to 12/05/17 for physical at 1:30 ok per [place] to be worked in. Left message on patient's voicemail the time and date of physical and to let us know if this does not work for him.

## 2017-12-04 NOTE — Progress Notes (Deleted)
[place] at [facility] [address], [place], [place] [address] [phone]  Date:  12/05/2017   Name:  Erik Weber   DOB:  February 21, 1975   MRN:  [id number]  PCP:  Darreld Mclean, MD    Chief Complaint: No chief complaint on file.   History of Present Illness:  Erik Weber is a 42 y.o. very pleasant male patient who presents with the following:  Here today for a CPE History of HTN, hypogonadism, dyslipidemia, GERD  Labs: done in May.  At that time I had suggested a chlesterol med but he did not reply to my questions about this  Immun: flu  Last seen here in May with chest sx thought due to GERD: Here today with a constant burning chest discomfort for about one month, and complaint of food getting caught in his throat.  Suspect a GI etiology Will add a PPI He will contact his GI doc asap Pt also notes an episodic chest pressure which is not present now.  Discussed in detail with pt.  This may certainly also be related to his GI issues but will obtain an ETT to further risk stratify.   He will try a baby aspirin for now, but watch for worsening of GI symptoms I don't think he did the stress test I had ordered for him  He saw GI last month- note below- and they plan an  EGD in about 2 weeks for him: 1. 42 yo male with breakthrough GERD sx   / recurrent dysphagia.  He had esophageal stenosis on EGD December 2017 , status post dilation with about a 1 year reprieve in dysphagia. Now having frequent heartburn / globus sensation as well as frequent solid food dysphagia.  Dysphagia worse than when I saw him in 2017 -We will arrange for repeat EGD with probable dilation. The risks and benefits of EGD were discussed and the patient agrees to proceed.  -Advised patient to eat small bites, chew well with liquids in between bites to avoid food impaction. -Hold phentermine 10 days prior to procedure 2. Medication concerns.  Patient  concerned about long-term use of PPIs .  Other took PPI for a long time and she was recently diagnosed with kidney cancer  -Explained to patient that some studies have shown an association between PPIs and acute nephritis and / or CKD.  I do not know that there is been an association between PPIs and kidney cancer however.  I understand the patient's concern especially since he already has CKD.  Having said that, the benefits of PPI therapy seem to outweigh the risk in this patient at this point.  He has room for improvement in the area of lifestyle modification such as reduction of caffeine consumption and weight loss, both of which may help with GERD symptoms.  -For now we will continue omeprazole 40 mg every morning 30 minutes before breakfast.  He is already taking over-the-counter Zantac about 4 nights a week.  Instead of increasing his PPI will increase Zantac to 300 mg nightly.  Other evaluation at time of EGD 3. CKD, baseline Cr 1.55-1.7 4. Obesity, on phentermine.   Patient Active Problem List   Diagnosis Date Noted  . Obesity 08/18/2013  . Tinea versicolor 05/19/2013  . Hypertriglyceridemia 05/28/2012  . Hypogonadism male 06/07/2011  . Essential hypertension 09/21/2009  . 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 GERD 04/04/2007  Past Medical History:  Diagnosis Date  . Childhood asthma   . Chronic renal insufficiency   . Fracture of base of fifth metacarpal bone of right hand   . GERD (gastroesophageal reflux disease)   . Hypertension   . Positive TB test     Past Surgical History:  Procedure Laterality Date  . HAND SURGERY Right 02/2016    Social History   Tobacco Use  . Smoking status: Never Smoker  . Smokeless tobacco: Never Used  Substance Use Topics  . Alcohol use: No    Alcohol/week: 0.0 standard drinks  . Drug use: No    Family History  Problem Relation Age of Onset  . Kidney cancer Mother 93  . Hypertension Father        age 3   . Diabetes Father   . Lung cancer Paternal Uncle   . Colon cancer Neg Hx   . Prostate cancer Neg Hx   . Liver disease Neg Hx   . Esophageal cancer Neg Hx   . Stomach cancer Neg Hx   . Pancreatic cancer Neg Hx     No Known Allergies  Medication list has been reviewed and updated.  Current Outpatient Medications on File Prior to Visit  Medication Sig Dispense Refill  . amLODipine (NORVASC) 5 MG tablet TAKE 1 TABLET BY MOUTH ONCE DAILY 90 tablet 1  . olmesartan (BENICAR) 20 MG tablet Take 1 tablet (20 mg total) by mouth daily. 90 tablet 3  . omeprazole (PRILOSEC) 40 MG capsule Take 1 capsule (40 mg total) by mouth daily. 30 capsule 3  . phentermine (ADIPEX-P) 37.5 MG tablet Take 1 tablet by mouth daily.  5  . ranitidine (ZANTAC) 300 MG tablet Take 1 tablet (300 mg total) by mouth at bedtime. 30 tablet 3   Current Facility-Administered Medications on File Prior to Visit  Medication Dose Route Frequency Provider Last Rate Last Dose  . 0.9 %  sodium chloride infusion  500 mL Intravenous Continuous Milus Banister, MD        Review of Systems:  As per HPI- otherwise negative.   Physical Examination: There were no vitals filed for this visit. There were no vitals filed for this visit. There is no height or weight on file to calculate BMI. Ideal Body Weight:    GEN: WDWN, NAD, Non-toxic, A & O x 3 HEENT: Atraumatic, Normocephalic. Neck supple. No masses, No LAD. Ears and Nose: No external deformity. CV: RRR, No M/G/R. No JVD. No thrill. No extra heart sounds. PULM: CTA B, no wheezes, crackles, rhonchi. No retractions. No resp. distress. No accessory muscle use. ABD: S, NT, ND, +BS. No rebound. No HSM. EXTR: No c/c/e NEURO Normal gait.  PSYCH: Normally interactive. Conversant. Not depressed or anxious appearing.  Calm demeanor.    Assessment and Plan: ***  Signed Lamar Blinks, MD

## 2017-12-05 ENCOUNTER — Encounter: Payer: BLUE CROSS/BLUE SHIELD | Admitting: Family Medicine

## 2017-12-12 DIAGNOSIS — Z6836 Body mass index (BMI) 36.0-36.9, adult: Secondary | ICD-10-CM | POA: Diagnosis not present

## 2017-12-12 DIAGNOSIS — Z13220 Encounter for screening for lipoid disorders: Secondary | ICD-10-CM | POA: Diagnosis not present

## 2017-12-12 DIAGNOSIS — Z131 Encounter for screening for diabetes mellitus: Secondary | ICD-10-CM | POA: Diagnosis not present

## 2017-12-12 DIAGNOSIS — Z0130 Encounter for examination of blood pressure without abnormal findings: Secondary | ICD-10-CM | POA: Diagnosis not present

## 2017-12-12 DIAGNOSIS — Z713 Dietary counseling and surveillance: Secondary | ICD-10-CM | POA: Diagnosis not present

## 2017-12-12 DIAGNOSIS — Z23 Encounter for immunization: Secondary | ICD-10-CM | POA: Diagnosis not present

## 2017-12-12 DIAGNOSIS — Z136 Encounter for screening for cardiovascular disorders: Secondary | ICD-10-CM | POA: Diagnosis not present

## 2017-12-13 ENCOUNTER — Encounter: Payer: Self-pay | Admitting: Family Medicine

## 2017-12-20 ENCOUNTER — Encounter (HOSPITAL_COMMUNITY): Payer: Self-pay | Admitting: Anesthesiology

## 2017-12-20 ENCOUNTER — Ambulatory Visit (HOSPITAL_COMMUNITY)
Admission: RE | Admit: 2017-12-20 | Payer: BLUE CROSS/BLUE SHIELD | Source: Ambulatory Visit | Admitting: Gastroenterology

## 2017-12-20 ENCOUNTER — Telehealth: Payer: Self-pay | Admitting: Gastroenterology

## 2017-12-20 ENCOUNTER — Encounter (HOSPITAL_COMMUNITY): Admission: RE | Payer: Self-pay | Source: Ambulatory Visit

## 2017-12-20 ENCOUNTER — Encounter: Payer: BLUE CROSS/BLUE SHIELD | Admitting: Family Medicine

## 2017-12-20 SURGERY — ESOPHAGOGASTRODUODENOSCOPY (EGD) WITH PROPOFOL
Anesthesia: Monitor Anesthesia Care

## 2017-12-20 MED ORDER — PROPOFOL 10 MG/ML IV BOLUS
INTRAVENOUS | Status: AC
  Filled 2017-12-20: qty 40

## 2017-12-20 NOTE — Telephone Encounter (Signed)
He no showed for this EGD appt at [facility]. Staff called him and he said he never agreed to this appointment.  Can you get in touch with him and see if he wants to reschedule for EGD at [facility] for possible dilation, needs to arrive 2-2 1/2 hours early given difficult IV access issues.

## 2017-12-20 NOTE — Anesthesia Preprocedure Evaluation (Deleted)
Anesthesia Evaluation    Reviewed: Allergy & Precautions, Patient's Chart, lab work & pertinent test results  Airway        Dental   Pulmonary neg pulmonary ROS,           Cardiovascular hypertension, negative cardio ROS       Neuro/Psych negative neurological ROS  negative psych ROS   GI/Hepatic Neg liver ROS, GERD  Medicated,  Endo/Other  negative endocrine ROS  Renal/GU Renal InsufficiencyRenal disease  negative genitourinary   Musculoskeletal negative musculoskeletal ROS (+)   Abdominal   Peds  Hematology negative hematology ROS (+)   Anesthesia Other Findings dysphagia  Reproductive/Obstetrics                             Anesthesia Physical Anesthesia Plan  ASA: II  Anesthesia Plan: MAC   Post-op Pain Management:    Induction: Intravenous  PONV Risk Score and Plan: 1 and Treatment may vary due to age or medical condition and Propofol infusion  Airway Management Planned: Natural Airway  Additional Equipment:   Intra-op Plan:   Post-operative Plan:   Informed Consent: I have reviewed the patients History and Physical, chart, labs and discussed the procedure including the risks, benefits and alternatives for the proposed anesthesia with the patient or authorized representative who has indicated his/her understanding and acceptance.   Dental advisory given  Plan Discussed with: CRNA  Anesthesia Plan Comments:         Anesthesia Quick Evaluation

## 2017-12-20 NOTE — Telephone Encounter (Signed)
Left message on machine to call back  

## 2017-12-20 NOTE — Progress Notes (Signed)
Patient did not show up for procedure.  Patient called and stated he is out of town and did not schedule the procedure for this day.  Dr. Ardis Hughs aware.  Vista Lawman. RN

## 2017-12-24 NOTE — Telephone Encounter (Signed)
Left message on machine to call back letter mailed.

## 2018-01-08 ENCOUNTER — Other Ambulatory Visit: Payer: Self-pay | Admitting: Family Medicine

## 2018-01-11 ENCOUNTER — Other Ambulatory Visit: Payer: Self-pay

## 2018-01-11 DIAGNOSIS — I10 Essential (primary) hypertension: Secondary | ICD-10-CM

## 2018-01-11 MED ORDER — OLMESARTAN MEDOXOMIL 20 MG PO TABS: 20.0000 mg | ORAL_TABLET | Freq: Every day | ORAL | 3 refills | Status: DC

## 2018-01-23 ENCOUNTER — Ambulatory Visit (HOSPITAL_COMMUNITY)
Admission: RE | Admit: 2018-01-23 | Discharge: 2018-01-23 | Disposition: A | Discharge status: Home / Self Care | Payer: BLUE CROSS/BLUE SHIELD | Source: Ambulatory Visit | Attending: General Surgery | Admitting: General Surgery

## 2018-01-23 ENCOUNTER — Encounter (HOSPITAL_COMMUNITY): Payer: Self-pay

## 2018-02-12 ENCOUNTER — Telehealth: Payer: Self-pay | Admitting: Family Medicine

## 2018-02-12 DIAGNOSIS — H608X3 Other otitis externa, bilateral: Secondary | ICD-10-CM | POA: Diagnosis not present

## 2018-02-12 DIAGNOSIS — I10 Essential (primary) hypertension: Secondary | ICD-10-CM

## 2018-02-12 DIAGNOSIS — K219 Gastro-esophageal reflux disease without esophagitis: Secondary | ICD-10-CM

## 2018-02-12 MED ORDER — AMLODIPINE BESYLATE 5 MG PO TABS: 5.0000 mg | ORAL_TABLET | Freq: Every day | ORAL | 0 refills | Status: DC

## 2018-02-12 MED ORDER — OLMESARTAN MEDOXOMIL 20 MG PO TABS: 20.0000 mg | ORAL_TABLET | Freq: Every day | ORAL | 0 refills | Status: DC

## 2018-02-12 MED ORDER — OMEPRAZOLE 40 MG PO CPDR: 40.0000 mg | DELAYED_RELEASE_CAPSULE | Freq: Every day | ORAL | 0 refills | Status: DC

## 2018-02-12 NOTE — Telephone Encounter (Signed)
Refill requests for olmesartan; amlodipine, and omeprazole; last office visit 06/22/2017; no upcoming visits noted; contacted pt but he is unable to schedule appointment because he is at another MD's appointment; he states that he will call back this afternoon, but requests a courtesy refill because he is out of BP medication; the pt also request that [facility] due to his insurance changing; will grant 30 day courtesy refill to cover pt until appointment; will also route to office for notification.      Requested Prescriptions  Pending Prescriptions Disp Refills  . olmesartan (BENICAR) 20 MG tablet 90 tablet 3    Sig: Take 1 tablet (20 mg total) by mouth daily.     Cardiovascular:  Angiotensin Receptor Blockers Failed - 02/12/2018  3:02 PM      Failed - Cr in normal range and within 180 days    Creat  Date Value Ref Range Status  10/22/2012 1.53 (H) 0.50 - 1.35 mg/dL Final   Creatinine, Ser  Date Value Ref Range Status  06/14/2017 1.55 (H) 0.40 - 1.50 mg/dL Final         Failed - K in normal range and within 180 days    Potassium  Date Value Ref Range Status  06/14/2017 4.2 3.5 - 5.1 mEq/L Final         Failed - Last BP in normal range    BP Readings from Last 1 Encounters:  11/09/17 (!) 148/85         Failed - Valid encounter within last 6 months    Recent Outpatient Visits          8 months ago Chest pain, unspecified type   [facility] at [facility], Gay Filler, MD   1 year ago Gastroesophageal reflux disease, esophagitis presence not specified   [facility] at [place], MD   1 year ago Dry cough   [facility] at [place], Gay Filler, MD   1 year ago Diarrhea, unspecified type   [facility]ivist at [place], DO   1 year ago Injury of right hand, initial encounter   [place], [place], DO             Passed - Patient is not pregnant    . amLODipine (NORVASC) 5 MG tablet 90 tablet 1    Sig: Take 1 tablet (5 mg total) by mouth daily.     Cardiovascular:  Calcium Channel Blockers Failed - 02/12/2018  3:02 PM      Failed - Last BP in normal range    BP Readings from Last 1 Encounters:  11/09/17 (!) 148/85         Failed - Valid encounter within last 6 months    Recent Outpatient Visits          8 months ago Chest pain, unspecified type   [facility] at [facility], Gay Filler, MD   1 year ago Gastroesophageal reflux disease, esophagitis presence not specified   [facility] at [facility], Gay Filler, MD   1 year ago Dry cough   Archivist at [place], Gay Filler, MD   1 year ago Diarrhea, unspecified type   Archivist at [facility],  Crosby Oyster, DO   1 year ago Injury of right hand, initial encounter   [place], Renee A, DO           . omeprazole (PRILOSEC) 40 MG capsule 30 capsule 3    Sig: Take 1 capsule (40 mg total) by mouth daily.     Gastroenterology: Proton Pump Inhibitors Passed - 02/12/2018  3:02 PM      Passed - Valid encounter within last 12 months    Recent Outpatient Visits          8 months ago Chest pain, unspecified type   [facility]ivist at [place], Gay Filler, MD   1 year ago Gastroesophageal reflux disease, esophagitis presence not specified   Archivist at [place], MD   1 year ago Dry cough   [facility]ivist at [place], Gay Filler, MD   1 year ago Diarrhea, unspecified type   Archivist at [place], [place]   1 year ago Injury of right hand, initial  encounter   [place], Renee A, DO

## 2018-02-12 NOTE — Telephone Encounter (Signed)
Copied from [place] [id number]. Topic: Quick Communication - Rx Refill/Question >> Feb 12, 2018  2:50 PM Erik Weber, NT wrote: Medication: olmesartan (BENICAR) 20 MG tablet HTZ, amLODipine (NORVASC) 5 MG tablet, omeprazole (PRILOSEC) 40 MG capsule   Has the patient contacted their pharmacy? Yes.   (Agent: If no, request that the patient contact the pharmacy for the refill.) (Agent: If yes, when and what did the pharmacy advise?)  Pts insurance changed and [facility] (with phone number or street name): [id number] [place] [place] [phone](Phone) [phone](Fax)    Agent: Please be advised that RX refills may take up to 3 business days. We ask that you follow-up with your pharmacy.

## 2018-02-13 NOTE — Telephone Encounter (Signed)
Patient given a 30 day courtesy refill on 02/12/18 due to he did not have an appointment scheduled. He called and scheduled the appointment for 02/18/18 and says that his new insurance requires a 90 day supply.

## 2018-02-13 NOTE — Telephone Encounter (Signed)
Pt scheduled for 02/18/2018. Pt states that with his new insurance the insurance requires a 90 day refill or he will not be able to get his medications. [facility]

## 2018-02-14 MED ORDER — OMEPRAZOLE 40 MG PO CPDR: 40.0000 mg | DELAYED_RELEASE_CAPSULE | Freq: Every day | ORAL | 0 refills | Status: DC

## 2018-02-14 MED ORDER — OLMESARTAN MEDOXOMIL 20 MG PO TABS: 20.0000 mg | ORAL_TABLET | Freq: Every day | ORAL | 0 refills | Status: DC

## 2018-02-14 MED ORDER — AMLODIPINE BESYLATE 5 MG PO TABS: 5.0000 mg | ORAL_TABLET | Freq: Every day | ORAL | 0 refills | Status: DC

## 2018-02-14 NOTE — Telephone Encounter (Signed)
90 day supply given to patient.

## 2018-02-14 NOTE — Addendum Note (Signed)
Addended by: Wynonia Musty A on: 02/14/2018 08:27 AM   Modules accepted: Orders

## 2018-02-16 NOTE — Progress Notes (Deleted)
[place] at [facility] [address] [place], [place] [address] [phone]  Date:  02/18/2018   Name:  Erik Weber   DOB:  1976-02-05   MRN:  [id number]  PCP:  Darreld Mclean, MD    Chief Complaint: No chief complaint on file.   History of Present Illness:  Erik Weber is a 43 y.o. very pleasant male patient who presents with the following:  Here today for medication check, history of dyslipidemia, hypertension, GERD. I last saw him in May,at which time he had symptoms of GERD.  We referred him to gastroenterology, and he was supposed to have an EGD.  However I am not sure if he actually had this.  The procedure was canceled once due to difficulty with starting IV  Flu shot: Tetanus:  He had full labs in May, at which time his cholesterol was not ideal He had not want to start a cholesterol medication at that time  Patient Active Problem List   Diagnosis Date Noted  . Obesity 08/18/2013  . Tinea versicolor 05/19/2013  . Hypertriglyceridemia 05/28/2012  . Hypogonadism male 06/07/2011  . Essential hypertension 09/21/2009  . 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 GERD 04/04/2007    Past Medical History:  Diagnosis Date  . Childhood asthma   . Chronic renal insufficiency   . Fracture of base of fifth metacarpal bone of right hand   . GERD (gastroesophageal reflux disease)   . Hypertension   . Positive TB test     Past Surgical History:  Procedure Laterality Date  . HAND SURGERY Right 02/2016    Social History   Tobacco Use  . Smoking status: Never Smoker  . Smokeless tobacco: Never Used  Substance Use Topics  . Alcohol use: No    Alcohol/week: 0.0 standard drinks  . Drug use: No    Family History  Problem Relation Age of Onset  . Kidney cancer Mother 32  . Hypertension Father        age 56  . Diabetes Father   . Lung cancer Paternal Uncle   . Colon cancer Neg Hx    . Prostate cancer Neg Hx   . Liver disease Neg Hx   . Esophageal cancer Neg Hx   . Stomach cancer Neg Hx   . Pancreatic cancer Neg Hx     No Known Allergies  Medication list has been reviewed and updated.  Current Outpatient Medications on File Prior to Visit  Medication Sig Dispense Refill  . amLODipine (NORVASC) 5 MG tablet Take 1 tablet (5 mg total) by mouth daily. 90 tablet 0  . olmesartan (BENICAR) 20 MG tablet Take 1 tablet (20 mg total) by mouth daily. 90 tablet 0  . omeprazole (PRILOSEC) 40 MG capsule Take 1 capsule (40 mg total) by mouth daily. 90 capsule 0  . phentermine (ADIPEX-P) 37.5 MG tablet Take 1 tablet by mouth daily.  5  . ranitidine (ZANTAC) 300 MG tablet Take 1 tablet (300 mg total) by mouth at bedtime. 30 tablet 3   Current Facility-Administered Medications on File Prior to Visit  Medication Dose Route Frequency Provider Last Rate Last Dose  . 0.9 %  sodium chloride infusion  500 mL Intravenous Continuous Milus Banister, MD        Review of Systems:  As per HPI- otherwise negative.   Physical Examination: There were no vitals filed for this visit. There  were no vitals filed for this visit. There is no height or weight on file to calculate BMI. Ideal Body Weight:    GEN: WDWN, NAD, Non-toxic, A & O x 3 HEENT: Atraumatic, Normocephalic. Neck supple. No masses, No LAD. Ears and Nose: No external deformity. CV: RRR, No M/G/R. No JVD. No thrill. No extra heart sounds. PULM: CTA B, no wheezes, crackles, rhonchi. No retractions. No resp. distress. No accessory muscle use. ABD: S, NT, ND, +BS. No rebound. No HSM. EXTR: No c/c/e NEURO Normal gait.  PSYCH: Normally interactive. Conversant. Not depressed or anxious appearing.  Calm demeanor.    Assessment and Plan: ***  Signed Lamar Blinks, MD

## 2018-02-18 ENCOUNTER — Ambulatory Visit: Payer: BLUE CROSS/BLUE SHIELD | Admitting: Family Medicine

## 2018-02-18 DIAGNOSIS — Z0289 Encounter for other administrative examinations: Secondary | ICD-10-CM

## 2018-04-09 ENCOUNTER — Ambulatory Visit (HOSPITAL_COMMUNITY)
Admission: RE | Admit: 2018-04-09 | Discharge: 2018-04-09 | Disposition: A | Discharge status: Home / Self Care | Payer: BLUE CROSS/BLUE SHIELD | Source: Ambulatory Visit | Attending: General Surgery | Admitting: General Surgery

## 2018-04-09 ENCOUNTER — Other Ambulatory Visit (HOSPITAL_COMMUNITY): Payer: Self-pay | Admitting: General Surgery

## 2018-04-09 DIAGNOSIS — Z01818 Encounter for other preprocedural examination: Secondary | ICD-10-CM | POA: Diagnosis not present

## 2018-04-09 DIAGNOSIS — I10 Essential (primary) hypertension: Secondary | ICD-10-CM | POA: Diagnosis not present

## 2018-04-10 ENCOUNTER — Encounter: Payer: BLUE CROSS/BLUE SHIELD | Attending: General Surgery | Admitting: Skilled Nursing Facility1

## 2018-04-10 ENCOUNTER — Encounter: Payer: Self-pay | Admitting: Skilled Nursing Facility1

## 2018-04-10 DIAGNOSIS — K219 Gastro-esophageal reflux disease without esophagitis: Secondary | ICD-10-CM | POA: Insufficient documentation

## 2018-04-10 NOTE — Progress Notes (Signed)
Pre-Op Assessment Visit:  Pre-Operative RYGB Surgery  Patient was seen on 04/10/2018 for Pre-Operative Nutrition Assessment. Assessment and letter of approval faxed to [facility] Bariatric Surgery Program coordinator on 04/10/2018.   Pt states he has very bad GERD resulting in emesis. Pt states he is getting the RYGB as a treatment for his GERD.  Pt expectation of surgery: to treat GERD  Pt expectation of Dietitian: to help  Start weight at [facility]: 267.4 BMI: 37.29   24 hr Dietary Recall: First Meal: fruit and veggie smoothie Snack:  Second Meal: chicken salad  Snack: chips or nuts Third Meal: chicken rice and broccoli  Snack:  Beverages: water with flavorings, soda, sweet tea   Encouraged to engage in 150 minutes of moderate physical activity including cardiovascular and weight baring weekly  Handouts given during visit include:  . Pre-Op Goals . Bariatric Surgery Protein Shakes During the appointment today the following Pre-Op Goals were reviewed with the patient: . Maintain or lose weight as instructed by your surgeon . Make healthy food choices . Begin to limit portion sizes . Limited concentrated sugars and fried foods . Keep fat/sugar in the single digits per serving on             food labels . Practice CHEWING your food  (aim for 30 chews per bite or until applesauce consistency) . Practice not drinking 15 minutes before, during, and 30 minutes after each meal/snack . Avoid all carbonated beverages  . Avoid/limit caffeinated beverages  . Avoid all sugar-sweetened beverages . Consume 3 meals per day; eat every 3-5 hours . Make a list of non-food related activities . Aim for 64-100 ounces of FLUID daily  . Aim for at least 60-80 grams of PROTEIN daily . Look for a liquid protein source that contain ?15 g protein and ?5 g carbohydrate  (ex: shakes, drinks, shots) . Try alkaline water  -Follow diet recommendations listed below   Energy and  Macronutrient Recomendations: Calories: 1800 Carbohydrate: 200 Protein: 135 Fat: 50  Demonstrated degree of understanding via:  Teach Back  Teaching Method Utilized:  Visual Auditory Hands on  Barriers to learning/adherence to lifestyle change: none identified   Patient to call the [facility] to enroll in Pre-Op and Post-Op Nutrition Education when surgery date is scheduled.

## 2018-05-08 ENCOUNTER — Other Ambulatory Visit: Payer: Self-pay | Admitting: Family Medicine

## 2018-05-08 DIAGNOSIS — K219 Gastro-esophageal reflux disease without esophagitis: Secondary | ICD-10-CM

## 2018-05-14 NOTE — Progress Notes (Signed)
[place] at [facility] [address] [place], [place] [address] [phone]  Date:  05/15/2018   Name:  KAHLEL PEAKE   DOB:  [birth year]   MRN:  [id number]  PCP:  Darreld Mclean, MD    Chief Complaint: No chief complaint on file.   History of Present Illness:  ELIJIAH MICKLEY is a 43 y.o. very pleasant male patient who presents with the following: Pt ID confirmed with name and DOB Virtual visit today due to COVID 19 Periodic visit - last seen by myself in May of 2019 At that time he had concern of GERD He also has HTN and obesity  Due for a tetanus shot -deferred today due to virtual visit BP is treated with amlodipine and olmesartan Last labs a year ago  He is working from home right now; this is going okay, he is actually quite busy He is able to check his BP at home- 132/78 is a typical reading.  He checks about once a week.   He has not noted any SE of his meds Went over his meds today and discussed refills  He is not exercising too much due to his work load.  Encouraged him to try and get out and walk when he can   No family history of prostate cancer No family history of colon cancer   Patient Active Problem List   Diagnosis Date Noted  . Obesity 08/18/2013  . Tinea versicolor 05/19/2013  . Hypertriglyceridemia 05/28/2012  . Hypogonadism male 06/07/2011  . Essential hypertension 09/21/2009  . 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 GERD 04/04/2007    Past Medical History:  Diagnosis Date  . Childhood asthma   . Chronic renal insufficiency   . Fracture of base of fifth metacarpal bone of right hand   . GERD (gastroesophageal reflux disease)   . Hypertension   . Positive TB test     Past Surgical History:  Procedure Laterality Date  . HAND SURGERY Right 02/2016    Social History   Tobacco Use  . Smoking status: Never Smoker  . Smokeless tobacco: Never Used  Substance  Use Topics  . Alcohol use: No    Alcohol/week: 0.0 standard drinks  . Drug use: No    Family History  Problem Relation Age of Onset  . Kidney cancer Mother 76  . Hypertension Father        age 42  . Diabetes Father   . Lung cancer Paternal Uncle   . Colon cancer Neg Hx   . Prostate cancer Neg Hx   . Liver disease Neg Hx   . Esophageal cancer Neg Hx   . Stomach cancer Neg Hx   . Pancreatic cancer Neg Hx     No Known Allergies  Medication list has been reviewed and updated.  Current Outpatient Medications on File Prior to Visit  Medication Sig Dispense Refill  . amLODipine (NORVASC) 5 MG tablet TAKE 1 TABLET BY MOUTH EVERY DAY 30 tablet 0  . olmesartan (BENICAR) 20 MG tablet Take 1 tablet (20 mg total) by mouth daily. 90 tablet 0  . omeprazole (PRILOSEC) 40 MG capsule TAKE 1 CAPSULE BY MOUTH EVERY DAY 30 capsule 0  . phentermine (ADIPEX-P) 37.5 MG tablet Take 1 tablet by mouth daily.  5  . ranitidine (ZANTAC) 300 MG tablet Take 1 tablet (300 mg total) by mouth at bedtime. 30 tablet 3  Current Facility-Administered Medications on File Prior to Visit  Medication Dose Route Frequency Provider Last Rate Last Dose  . 0.9 %  sodium chloride infusion  500 mL Intravenous Continuous Milus Banister, MD        Review of Systems:  As per HPI- otherwise negative.  No fever, he has not been sick with a cough or code symptoms Physical Examination: There were no vitals filed for this visit. There were no vitals filed for this visit. There is no height or weight on file to calculate BMI. Ideal Body Weight:    Patient was observed over the video today.  He looks well, his normal self.  No coughing, wheezing, tachypnea is evident  Assessment and Plan: Essential hypertension - Plan: amLODipine (NORVASC) 5 MG tablet, olmesartan (BENICAR) 20 MG tablet, CBC, Comprehensive metabolic panel  Gastroesophageal reflux disease, esophagitis presence not specified  Screening for diabetes  mellitus - Plan: Comprehensive metabolic panel, Hemoglobin A1c  Screening for hyperlipidemia - Plan: Lipid panel  Screening for prostate cancer - Plan: PSA  Virtual visit today due COVID19 outbreak.  Nikalas is doing well, he has no concerns.  He is checking his blood pressure weekly, and notes his blood pressures been under good control. He is taking a PPI for GERD, and is doing well with this medication History of esophageal stricture, status post dilation.  He is a GI patient-a repeat EGD was attempted last fall, but they were not able to get IV access. Refilled amlodipine and olmesartan for blood pressure. Ordered screening labs, he will come in for lab draw at his convenience Due for tetanus vaccine  Patient does not have my chart set up, so I will mail him a letter with my visit summary  Signed Lamar Blinks, MD

## 2018-05-15 ENCOUNTER — Ambulatory Visit (INDEPENDENT_AMBULATORY_CARE_PROVIDER_SITE_OTHER): Payer: BLUE CROSS/BLUE SHIELD | Admitting: Family Medicine

## 2018-05-15 ENCOUNTER — Other Ambulatory Visit: Payer: Self-pay

## 2018-05-15 ENCOUNTER — Encounter: Payer: Self-pay | Admitting: Family Medicine

## 2018-05-15 VITALS — BP 132/78

## 2018-05-15 DIAGNOSIS — Z13220 Encounter for screening for lipoid disorders: Secondary | ICD-10-CM

## 2018-05-15 DIAGNOSIS — K219 Gastro-esophageal reflux disease without esophagitis: Secondary | ICD-10-CM

## 2018-05-15 DIAGNOSIS — Z131 Encounter for screening for diabetes mellitus: Secondary | ICD-10-CM

## 2018-05-15 DIAGNOSIS — I10 Essential (primary) hypertension: Secondary | ICD-10-CM

## 2018-05-15 DIAGNOSIS — Z125 Encounter for screening for malignant neoplasm of prostate: Secondary | ICD-10-CM

## 2018-05-15 MED ORDER — AMLODIPINE BESYLATE 5 MG PO TABS: 5.0000 mg | ORAL_TABLET | Freq: Every day | ORAL | 3 refills | Status: DC

## 2018-05-15 MED ORDER — OLMESARTAN MEDOXOMIL 20 MG PO TABS: 20.0000 mg | ORAL_TABLET | Freq: Every day | ORAL | 3 refills | Status: DC

## 2018-05-15 NOTE — Patient Instructions (Signed)
It was great to talk to you today. Continue amlodipine and olmesartan for blood pressure I ordered routine labs for you, these can be done at your convenience at a lab appointment. It looks like you are also due for tetanus vaccine.  This can be given at our next routine visit, or the nurse visit  Please do work on exercise, and try your best to manage stress. Please see me in about 6 months for recheck visit

## 2018-06-08 ENCOUNTER — Other Ambulatory Visit: Payer: Self-pay | Admitting: Family Medicine

## 2018-06-08 DIAGNOSIS — K219 Gastro-esophageal reflux disease without esophagitis: Secondary | ICD-10-CM

## 2018-07-17 ENCOUNTER — Ambulatory Visit (INDEPENDENT_AMBULATORY_CARE_PROVIDER_SITE_OTHER): Payer: BC Managed Care – PPO | Admitting: Professional

## 2018-07-17 DIAGNOSIS — F509 Eating disorder, unspecified: Secondary | ICD-10-CM

## 2018-07-22 ENCOUNTER — Ambulatory Visit (INDEPENDENT_AMBULATORY_CARE_PROVIDER_SITE_OTHER): Payer: BC Managed Care – PPO | Admitting: Professional

## 2018-07-22 DIAGNOSIS — F509 Eating disorder, unspecified: Secondary | ICD-10-CM | POA: Diagnosis not present

## 2018-07-30 ENCOUNTER — Other Ambulatory Visit: Payer: Self-pay

## 2018-07-30 ENCOUNTER — Encounter: Payer: BC Managed Care – PPO | Attending: General Surgery | Admitting: Skilled Nursing Facility1

## 2018-07-30 DIAGNOSIS — E669 Obesity, unspecified: Secondary | ICD-10-CM | POA: Insufficient documentation

## 2018-07-31 ENCOUNTER — Ambulatory Visit: Payer: Self-pay | Admitting: Professional

## 2018-07-31 NOTE — Progress Notes (Signed)
Pre-Operative Nutrition Class:  Appt start time: 1751   End time:  1830.  Patient was seen on 07/30/2018 for Pre-Operative Bariatric Surgery Education at [facility].   Surgery date:  Surgery type: RYGB Start weight at [facility]: 267.4 Weight today: 276.8  Samples given per MNT protocol. Patient educated on appropriate usage: Bariatric Advantage Multivitamin Lot # [id number] Exp: 04/21  Bariatric Advantage Calcium  Lot # [id number] Exp: 05/18/19   Protein20 Lot # [id number] Exp: 06/25/19  The following the learning objectives were met by the patient during this course:  Identify Pre-Op Dietary Goals and will begin 2 weeks pre-operatively  Identify appropriate sources of fluids and proteins   State protein recommendations and appropriate sources pre and post-operatively  Identify Post-Operative Dietary Goals and will follow for 2 weeks post-operatively  Identify appropriate multivitamin and calcium sources  Describe the need for physical activity post-operatively and will follow MD recommendations  State when to call healthcare provider regarding medication questions or post-operative complications  Handouts given during class include:  Pre-Op Bariatric Surgery Diet Handout  Protein Shake Handout  Post-Op Bariatric Surgery Nutrition Handout  BELT Program Information Flyer  Support Group Information Flyer  [facility] Bariatric Supplements Price List  Follow-Up Plan: Patient will follow-up at [facility] 2 weeks post operatively for diet advancement per MD.

## 2018-08-06 ENCOUNTER — Telehealth: Payer: Self-pay

## 2018-08-06 DIAGNOSIS — I10 Essential (primary) hypertension: Secondary | ICD-10-CM

## 2018-08-06 DIAGNOSIS — Z125 Encounter for screening for malignant neoplasm of prostate: Secondary | ICD-10-CM

## 2018-08-06 DIAGNOSIS — E781 Pure hyperglyceridemia: Secondary | ICD-10-CM

## 2018-08-06 NOTE — Telephone Encounter (Signed)
Labs changed to [facility] informing Pt that labs have been changed.

## 2018-08-06 NOTE — Telephone Encounter (Signed)
Copied from [place] [id number]. Topic: General - Other >> Aug 06, 2018  3:58 PM Marin Olp L wrote: Reason for CRM: Patient would like his labs from [facility] changed to be done at [facility] lab instead. Would like a call once this is completed.

## 2018-08-08 ENCOUNTER — Other Ambulatory Visit (INDEPENDENT_AMBULATORY_CARE_PROVIDER_SITE_OTHER): Payer: BC Managed Care – PPO

## 2018-08-08 ENCOUNTER — Other Ambulatory Visit: Payer: Self-pay

## 2018-08-08 ENCOUNTER — Telehealth: Payer: Self-pay

## 2018-08-08 ENCOUNTER — Other Ambulatory Visit: Payer: Self-pay | Admitting: Family Medicine

## 2018-08-08 DIAGNOSIS — Z125 Encounter for screening for malignant neoplasm of prostate: Secondary | ICD-10-CM

## 2018-08-08 DIAGNOSIS — I10 Essential (primary) hypertension: Secondary | ICD-10-CM

## 2018-08-08 DIAGNOSIS — Z114 Encounter for screening for human immunodeficiency virus [HIV]: Secondary | ICD-10-CM

## 2018-08-08 DIAGNOSIS — R0600 Dyspnea, unspecified: Secondary | ICD-10-CM

## 2018-08-08 DIAGNOSIS — E781 Pure hyperglyceridemia: Secondary | ICD-10-CM

## 2018-08-08 LAB — COMPREHENSIVE METABOLIC PANEL
ALT: 24 U/L (ref 0–53)
AST: 16 U/L (ref 0–37)
Albumin: 4.6 g/dL (ref 3.5–5.2)
Alkaline Phosphatase: 67 U/L (ref 39–117)
BUN: 15 mg/dL (ref 6–23)
CO2: 25 mEq/L (ref 19–32)
Calcium: 9.5 mg/dL (ref 8.4–10.5)
Chloride: 103 mEq/L (ref 96–112)
Creatinine, Ser: 1.67 mg/dL — ABNORMAL HIGH (ref 0.40–1.50)
GFR: 54.39 mL/min — ABNORMAL LOW (ref >60.00)
Glucose, Bld: 86 mg/dL (ref 70–99)
Potassium: 4.4 mEq/L (ref 3.5–5.1)
Sodium: 138 mEq/L (ref 135–145)
Total Bilirubin: 0.7 mg/dL (ref 0.2–1.2)
Total Protein: 7.5 g/dL (ref 6.0–8.3)

## 2018-08-08 LAB — HEMOGLOBIN A1C: Hgb A1c MFr Bld: 4.7 % (ref 4.6–6.5)

## 2018-08-08 LAB — CBC WITH DIFFERENTIAL/PLATELET
Basophils Absolute: 0.1 10*3/uL (ref 0.0–0.1)
Basophils Relative: 0.9 % (ref 0.0–3.0)
Eosinophils Absolute: 0.2 10*3/uL (ref 0.0–0.7)
Eosinophils Relative: 2.6 % (ref 0.0–5.0)
HCT: 48.7 % (ref 39.0–52.0)
Hemoglobin: 16.1 g/dL (ref 13.0–17.0)
Lymphocytes Relative: 41.3 % (ref 12.0–46.0)
Lymphs Abs: 3.8 10*3/uL (ref 0.7–4.0)
MCHC: 33 g/dL (ref 30.0–36.0)
MCV: 85.1 fl (ref 78.0–100.0)
Monocytes Absolute: 0.7 10*3/uL (ref 0.1–1.0)
Monocytes Relative: 7.9 % (ref 3.0–12.0)
Neutro Abs: 4.4 10*3/uL (ref 1.4–7.7)
Neutrophils Relative %: 47.3 % (ref 43.0–77.0)
Platelets: 267 10*3/uL (ref 150.0–400.0)
RBC: 5.72 Mil/uL (ref 4.22–5.81)
RDW: 13.6 % (ref 11.5–15.5)
WBC: 9.2 10*3/uL (ref 4.0–10.5)

## 2018-08-08 LAB — LIPID PANEL
Cholesterol: 199 mg/dL (ref 0–200)
HDL: 35.8 mg/dL — ABNORMAL LOW (ref >39.00)
NonHDL: 162.99
Total CHOL/HDL Ratio: 6
Triglycerides: 247 mg/dL — ABNORMAL HIGH (ref 0.0–149.0)
VLDL: 49.4 mg/dL — ABNORMAL HIGH (ref 0.0–40.0)

## 2018-08-08 LAB — LDL CHOLESTEROL, DIRECT: Direct LDL: 95 mg/dL

## 2018-08-08 LAB — PSA: PSA: 0.21 ng/mL (ref 0.10–4.00)

## 2018-08-08 NOTE — Telephone Encounter (Signed)
Copied from [place] [phone]. Topic: Referral - Request for Referral >> Aug 08, 2018 10:29 AM Rainey Pines A wrote: Has patient seen PCP for this complaint?Yes *If NO, is insurance requiring patient see PCP for this issue before PCP can refer them? Referral for which specialty: Sleep Study Preferred provider/office: [facility]  Reason for referral: Sleep Apnea

## 2018-08-08 NOTE — Progress Notes (Signed)
Received his labs- letter to pt Renal function is stable to 2012  The 10-year ASCVD risk score Mikey Bussing DC Brooke Bonito., et al., 2013) is: 7.2%   Values used to calculate the score:     Age: 43 years     Sex: Male     Is Non-Hispanic African American: Yes     Diabetic: No     Tobacco smoker: No     Systolic Blood Pressure: 676 mmHg     Is BP treated: Yes     HDL Cholesterol: 35.8 mg/dL     Total Cholesterol: 199 mg/dL   Results for orders placed or performed in visit on 08/08/18  CBC w/Diff  Result Value Ref Range   WBC 9.2 4.0 - 10.5 K/uL   RBC 5.72 4.22 - 5.81 Mil/uL   Hemoglobin 16.1 13.0 - 17.0 g/dL   HCT 48.7 39.0 - 52.0 %   MCV 85.1 78.0 - 100.0 fl   MCHC 33.0 30.0 - 36.0 g/dL   RDW 13.6 11.5 - 15.5 %   Platelets 267.0 150.0 - 400.0 K/uL   Neutrophils Relative % 47.3 43.0 - 77.0 %   Lymphocytes Relative 41.3 12.0 - 46.0 %   Monocytes Relative 7.9 3.0 - 12.0 %   Eosinophils Relative 2.6 0.0 - 5.0 %   Basophils Relative 0.9 0.0 - 3.0 %   Neutro Abs 4.4 1.4 - 7.7 K/uL   Lymphs Abs 3.8 0.7 - 4.0 K/uL   Monocytes Absolute 0.7 0.1 - 1.0 K/uL   Eosinophils Absolute 0.2 0.0 - 0.7 K/uL   Basophils Absolute 0.1 0.0 - 0.1 K/uL  Comp Met (CMET)  Result Value Ref Range   Sodium 138 135 - 145 mEq/L   Potassium 4.4 3.5 - 5.1 mEq/L   Chloride 103 96 - 112 mEq/L   CO2 25 19 - 32 mEq/L   Glucose, Bld 86 70 - 99 mg/dL   BUN 15 6 - 23 mg/dL   Creatinine, Ser 1.67 (H) 0.40 - 1.50 mg/dL   Total Bilirubin 0.7 0.2 - 1.2 mg/dL   Alkaline Phosphatase 67 39 - 117 U/L   AST 16 0 - 37 U/L   ALT 24 0 - 53 U/L   Total Protein 7.5 6.0 - 8.3 g/dL   Albumin 4.6 3.5 - 5.2 g/dL   Calcium 9.5 8.4 - 10.5 mg/dL   GFR 54.39 (L) >60.00 mL/min  Hemoglobin A1c  Result Value Ref Range   Hgb A1c MFr Bld 4.7 4.6 - 6.5 %  Lipid panel  Result Value Ref Range   Cholesterol 199 0 - 200 mg/dL   Triglycerides 247.0 (H) 0.0 - 149.0 mg/dL   HDL 35.80 (L) >39.00 mg/dL   VLDL 49.4 (H) 0.0 - 40.0 mg/dL   Total  CHOL/HDL Ratio 6    NonHDL 162.99   PSA  Result Value Ref Range   PSA 0.21 0.10 - 4.00 ng/mL  LDL cholesterol, direct  Result Value Ref Range   Direct LDL 95.0 mg/dL

## 2018-08-09 LAB — HIV ANTIBODY (ROUTINE TESTING W REFLEX): HIV 1&2 Ab, 4th Generation: NONREACTIVE

## 2018-08-14 ENCOUNTER — Encounter: Payer: Self-pay | Admitting: Pulmonary Disease

## 2018-08-14 ENCOUNTER — Ambulatory Visit (INDEPENDENT_AMBULATORY_CARE_PROVIDER_SITE_OTHER): Payer: BC Managed Care – PPO | Admitting: Pulmonary Disease

## 2018-08-14 ENCOUNTER — Other Ambulatory Visit: Payer: Self-pay

## 2018-08-14 VITALS — BP 142/78 | HR 71 | Temp 98.1°F | Ht 71.0 in | Wt 276.8 lb

## 2018-08-14 DIAGNOSIS — I10 Essential (primary) hypertension: Secondary | ICD-10-CM | POA: Diagnosis not present

## 2018-08-14 DIAGNOSIS — R0683 Snoring: Secondary | ICD-10-CM

## 2018-08-14 DIAGNOSIS — R0681 Apnea, not elsewhere classified: Secondary | ICD-10-CM | POA: Diagnosis not present

## 2018-08-14 DIAGNOSIS — N183 Chronic kidney disease, stage 3 (moderate): Secondary | ICD-10-CM | POA: Diagnosis not present

## 2018-08-14 DIAGNOSIS — R400 Somnolence: Secondary | ICD-10-CM

## 2018-08-14 DIAGNOSIS — E669 Obesity, unspecified: Secondary | ICD-10-CM | POA: Diagnosis not present

## 2018-08-14 NOTE — Progress Notes (Signed)
Subjective:     Patient ID: Erik Weber, male   DOB: January 25, 1976, 43 y.o.   MRN: [id number]  Patient is here to be evaluated for possible obstructive sleep apnea  History of severe snoring He does have occasional headaches Wakes up with a dry mouth  No family history of obstructive sleep apnea  Sleep is nonrestorative  He has no memory issues  Usually goes to bed about 11 PM takes him about 10 minutes to fall asleep Wakes up about once or twice at night Final wake up time about 7 AM  His weight has gone up and down recently but recently he is up over 40 pounds  Blood pressure is adequately controlled on medications He is physically active    18 Neck size  Review of Systems  Constitutional: Positive for unexpected weight change.  HENT: Positive for trouble swallowing.   Eyes: Negative.   Respiratory: Negative.   Cardiovascular: Negative.   Gastrointestinal: Negative.   Endocrine: Negative.   Genitourinary: Negative.   Musculoskeletal: Negative.   Skin: Negative.   Allergic/Immunologic: Positive for environmental allergies.  Neurological: Negative.   Hematological: Negative.   Psychiatric/Behavioral: Negative.    Past Medical History:  Diagnosis Date  . Childhood asthma   . Chronic renal insufficiency   . Fracture of base of fifth metacarpal bone of right hand   . GERD (gastroesophageal reflux disease)   . Hypertension   . Positive TB test    Family History  Problem Relation Age of Onset  . Kidney cancer Mother 41  . Hypertension Father        age 20  . Diabetes Father   . Lung cancer Paternal Uncle   . Colon cancer Neg Hx   . Prostate cancer Neg Hx   . Liver disease Neg Hx   . Esophageal cancer Neg Hx   . Stomach cancer Neg Hx   . Pancreatic cancer Neg Hx    Social History   Socioeconomic History  . Marital status: Single    Spouse name: Not on file  . Number of children: Not on file  . Years of education: Not on file  . Highest education  level: Not on file  Occupational History  . Not on file  Social Needs  . Financial resource strain: Not on file  . Food insecurity    Worry: Not on file    Inability: Not on file  . Transportation needs    Medical: Not on file    Non-medical: Not on file  Tobacco Use  . Smoking status: Never Smoker  . Smokeless tobacco: Never Used  Substance and Sexual Activity  . Alcohol use: No    Alcohol/week: 0.0 standard drinks  . Drug use: No  . Sexual activity: Yes    Partners: Male  Lifestyle  . Physical activity    Days per week: Not on file    Minutes per session: Not on file  . Stress: Not on file  Relationships  . Social Herbalist on phone: Not on file    Gets together: Not on file    Attends religious service: Not on file    Active member of club or organization: Not on file    Attends meetings of clubs or organizations: Not on file    Relationship status: Not on file  . Intimate partner violence    Fear of current or ex partner: Not on file    Emotionally abused: Not on file  Physically abused: Not on file    Forced sexual activity: Not on file  Other Topics Concern  . Not on file  Social History Narrative   Occupation: Works for lab   Single    Adopted daughter - 4   Moved from [place] 4 yrs ago.   Never Smoked   Alcohol use-no   Caffeine use/day:  None   Does Patient Exercise:  yes                Objective:   Physical Exam Vitals signs reviewed.  Constitutional:      General: He is not in acute distress.    Appearance: Normal appearance. He is obese. He is not ill-appearing.  HENT:     Head: Normocephalic and atraumatic.  Eyes:     Pupils: Pupils are equal, round, and reactive to light.  Cardiovascular:     Rate and Rhythm: Normal rate and regular rhythm.     Pulses: Normal pulses.     Heart sounds: No murmur.  Pulmonary:     Effort: Pulmonary effort is normal. No respiratory distress.     Breath sounds: Normal breath sounds. No  stridor. No wheezing or rhonchi.  Abdominal:     General: There is no distension.     Tenderness: There is no abdominal tenderness.  Musculoskeletal: Normal range of motion.        General: No swelling or tenderness.  Skin:    General: Skin is warm and dry.     Coloration: Skin is not jaundiced or pale.  Neurological:     General: No focal deficit present.     Mental Status: He is alert and oriented to person, place, and time.  Psychiatric:        Mood and Affect: Mood normal.    Vitals:   08/14/18 1406  BP: (!) 142/78  Pulse: 71  Temp: 98.1 F (36.7 C)  SpO2: 95%   Results of the Epworth flowsheet 08/14/2018  Sitting and reading 0  Watching TV 0  Sitting, inactive in a public place (e.g. a theatre or a meeting) 0  As a passenger in a car for an hour without a break 0  Lying down to rest in the afternoon when circumstances permit 0  Sitting and talking to someone 0  Sitting quietly after a lunch without alcohol 0  In a car, while stopped for a few minutes in traffic 0  Total score 0       Assessment:     Moderate to high probability of significant sleep disordered breathing  Obesity  Nonrestorative sleep  Pathophysiology of obstructive sleep apnea was discussed with the patient Treatment options discussed with the patient    Plan:     Order home sleep study for evaluation of sleep disordered breathing  Treatment options as discussed  Encourage increased physical activity to help with weight management  We will see him back in the office in about 3 months

## 2018-08-14 NOTE — Patient Instructions (Signed)
Moderate to high probability of significant sleep disordered breathing  Treatment options as discussed with you  We will set you up for home sleep study We will give you a call following reviewing the study  I will see you back in the office in about 3 months Call with significant concerns

## 2018-09-10 ENCOUNTER — Other Ambulatory Visit: Payer: Self-pay

## 2018-09-10 ENCOUNTER — Ambulatory Visit: Payer: BC Managed Care – PPO

## 2018-09-10 DIAGNOSIS — R0683 Snoring: Secondary | ICD-10-CM

## 2018-09-10 DIAGNOSIS — R0681 Apnea, not elsewhere classified: Secondary | ICD-10-CM

## 2018-09-10 DIAGNOSIS — G4733 Obstructive sleep apnea (adult) (pediatric): Secondary | ICD-10-CM | POA: Diagnosis not present

## 2018-09-17 DIAGNOSIS — G4733 Obstructive sleep apnea (adult) (pediatric): Secondary | ICD-10-CM | POA: Diagnosis not present

## 2018-09-20 DIAGNOSIS — G4733 Obstructive sleep apnea (adult) (pediatric): Secondary | ICD-10-CM | POA: Diagnosis not present

## 2018-09-20 DIAGNOSIS — I10 Essential (primary) hypertension: Secondary | ICD-10-CM | POA: Diagnosis not present

## 2018-09-20 DIAGNOSIS — E7800 Pure hypercholesterolemia, unspecified: Secondary | ICD-10-CM | POA: Diagnosis not present

## 2018-10-02 ENCOUNTER — Ambulatory Visit (INDEPENDENT_AMBULATORY_CARE_PROVIDER_SITE_OTHER): Payer: BC Managed Care – PPO | Admitting: Family Medicine

## 2018-10-02 ENCOUNTER — Encounter: Payer: Self-pay | Admitting: Family Medicine

## 2018-10-02 DIAGNOSIS — R062 Wheezing: Secondary | ICD-10-CM | POA: Diagnosis not present

## 2018-10-02 MED ORDER — DOXYCYCLINE HYCLATE 100 MG PO CAPS: 100.0000 mg | ORAL_CAPSULE | Freq: Two times a day (BID) | ORAL | 0 refills | Status: DC

## 2018-10-02 MED ORDER — ALBUTEROL SULFATE HFA 108 (90 BASE) MCG/ACT IN AERS: 2.0000 | INHALATION_SPRAY | Freq: Four times a day (QID) | RESPIRATORY_TRACT | 1 refills | Status: DC | PRN

## 2018-10-02 NOTE — Progress Notes (Signed)
[place] at [facility] [address], [place], [place] [address] [phone]  Date:  10/02/2018   Name:  Erik Weber   DOB:  1975/11/09   MRN:  [id number]  PCP:  Darreld Mclean, MD    Chief Complaint: No chief complaint on file.   History of Present Illness:  Erik Weber is a 43 y.o. very pleasant male patient who presents with the following:  Virtual visit today to discuss possible illness History of HTN, GERD, renal insuf Pt is at home, provider is at office Pt ID is confirmed with 2 factors, he gives consent for virtual visit today He started swimming lessons last week-he jumped in the pool a week ago yesterday, and swallowed, inhaled water.  He had to cough quite hard to get the water out Since that time he has noted cough, and some wheezing.  It is a chlorine pool No fever  The cough is dry If he lays on his right side he has has more discomfort/cough No nausea or vomiting  Never had anything like this in the past -he did have asthma as a child  During adulthood he has not really suffered from asthma  He does not feel that he is in any distress, does not feel that he needs the ER.  He thinks he just inhaled too much water   Patient Active Problem List   Diagnosis Date Noted  . Obesity 08/18/2013  . Tinea versicolor 05/19/2013  . Hypertriglyceridemia 05/28/2012  . Hypogonadism male 06/07/2011  . Essential hypertension 09/21/2009  . 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 GERD 04/04/2007    Past Medical History:  Diagnosis Date  . Childhood asthma   . Chronic renal insufficiency   . Fracture of base of fifth metacarpal bone of right hand   . GERD (gastroesophageal reflux disease)   . Hypertension   . Positive TB test     Past Surgical History:  Procedure Laterality Date  . HAND SURGERY Right 02/2016    Social History   Tobacco Use  . Smoking status: Never Smoker  .  Smokeless tobacco: Never Used  Substance Use Topics  . Alcohol use: No    Alcohol/week: 0.0 standard drinks  . Drug use: No    Family History  Problem Relation Age of Onset  . Kidney cancer Mother 1  . Hypertension Father        age 29  . Diabetes Father   . Lung cancer Paternal Uncle   . Colon cancer Neg Hx   . Prostate cancer Neg Hx   . Liver disease Neg Hx   . Esophageal cancer Neg Hx   . Stomach cancer Neg Hx   . Pancreatic cancer Neg Hx     No Known Allergies  Medication list has been reviewed and updated.  Current Outpatient Medications on File Prior to Visit  Medication Sig Dispense Refill  . amLODipine (NORVASC) 5 MG tablet Take 1 tablet (5 mg total) by mouth daily. 90 tablet 3  . olmesartan (BENICAR) 20 MG tablet Take 1 tablet (20 mg total) by mouth daily. 90 tablet 3  . omeprazole (PRILOSEC) 40 MG capsule TAKE 1 CAPSULE BY MOUTH EVERY DAY 90 capsule 1   Current Facility-Administered Medications on File Prior to Visit  Medication Dose Route Frequency Provider Last Rate Last Dose  . 0.9 %  sodium chloride infusion  500 mL Intravenous Continuous Owens Loffler  P, MD        Review of Systems:  As per HPI- otherwise negative. No chest pain  Physical Examination: There were no vitals filed for this visit. There were no vitals filed for this visit. There is no height or weight on file to calculate BMI. Ideal Body Weight:    BP 130/82 on last check at home Patient observed on video today.  He looks well, no cough, wheezing, distress is noted  Assessment and Plan:   ICD-10-CM   1. Wheezing  R06.2    Patient is learning to swim, I congratulated him on this project.  He did inhale some chlorine pool water last week.  Since then he has had some cough and wheezing.  Offered a COVID-19 test, he declines at this time.  We will treat him with doxycycline and albuterol as needed I have asked him to watch his symptoms closely, and seek care if he is not getting better  or if he is getting worse  Follow-up: No follow-ups on file.  No orders of the defined types were placed in this encounter.  No orders of the defined types were placed in this encounter.   @SIGN @  Outpatient Encounter Medications as of 10/02/2018  Medication Sig  . amLODipine (NORVASC) 5 MG tablet Take 1 tablet (5 mg total) by mouth daily.  Marland Kitchen olmesartan (BENICAR) 20 MG tablet Take 1 tablet (20 mg total) by mouth daily.  Marland Kitchen omeprazole (PRILOSEC) 40 MG capsule TAKE 1 CAPSULE BY MOUTH EVERY DAY   Facility-Administered Encounter Medications as of 10/02/2018  Medication  . 0.9 %  sodium chloride infusion     Signed Lamar Blinks, MD

## 2018-10-04 ENCOUNTER — Other Ambulatory Visit: Payer: Self-pay

## 2018-10-04 ENCOUNTER — Encounter: Payer: Self-pay | Admitting: Family Medicine

## 2018-10-04 ENCOUNTER — Ambulatory Visit: Payer: Self-pay | Admitting: *Deleted

## 2018-10-04 ENCOUNTER — Ambulatory Visit (INDEPENDENT_AMBULATORY_CARE_PROVIDER_SITE_OTHER): Payer: BC Managed Care – PPO | Admitting: Family Medicine

## 2018-10-04 DIAGNOSIS — J189 Pneumonia, unspecified organism: Secondary | ICD-10-CM | POA: Diagnosis not present

## 2018-10-04 DIAGNOSIS — J984 Other disorders of lung: Secondary | ICD-10-CM

## 2018-10-04 HISTORY — DX: Pneumonia, unspecified organism: J18.9

## 2018-10-04 HISTORY — DX: Other disorders of lung: J98.4

## 2018-10-04 MED ORDER — PREDNISONE 20 MG PO TABS: 40.0000 mg | ORAL_TABLET | Freq: Every day | ORAL | 0 refills | Status: AC

## 2018-10-04 NOTE — Telephone Encounter (Signed)
Dr. Nani Ravens,  this is an fyi on a patient of ours yall are seeing this afternoon.

## 2018-10-04 NOTE — Telephone Encounter (Signed)
1. Wheezing  R06.2    Patient is learning to swim, I congratulated him on this project.  He did inhale some chlorine pool water last week.  Since then he has had some cough and wheezing.  Offered a COVID-19 test, he declines at this time.  We will treat him with doxycycline and albuterol as needed I have asked him to watch his symptoms closely, and seek care if he is not getting better or if he is getting worse  Patient is calling to report he is on the 3rd day of antibiotic- he is reporting no change, the inhaler does not help that much. Patient had tried Musinex- and states that made his symptoms worse.Cough and chest tightness are worse. Reason for Disposition . [1] MILD difficulty breathing (e.g., minimal/no SOB at rest, SOB with walking, pulse <100) AND [2] NEW-onset or WORSE than normal  Answer Assessment - Initial Assessment Questions 1. RESPIRATORY STATUS: "Describe your breathing?" (e.g., wheezing, shortness of breath, unable to speak, severe coughing)      Tightness in chest-R side, patient is having tightness when laying on R side 2. ONSET: "When did this breathing problem begin?"      Last week 3. PATTERN "Does the difficult breathing come and go, or has it been constant since it started?"      Constant- feels like there is congestion in the lung 4. SEVERITY: "How bad is your breathing?" (e.g., mild, moderate, severe)    - MILD: No SOB at rest, mild SOB with walking, speaks normally in sentences, can lay down, no retractions, pulse < 100.    - MODERATE: SOB at rest, SOB with minimal exertion and prefers to sit, cannot lie down flat, speaks in phrases, mild retractions, audible wheezing, pulse 100-120.    - SEVERE: Very SOB at rest, speaks in single words, struggling to breathe, sitting hunched forward, retractions, pulse > 120      mild 5. RECURRENT SYMPTOM: "Have you had difficulty breathing before?" If so, ask: "When was the last time?" and "What happened that time?"      Yes-  patient has asthma 6. CARDIAC HISTORY: "Do you have any history of heart disease?" (e.g., heart attack, angina, bypass surgery, angioplasty)      No- high blood pressure 7. LUNG HISTORY: "Do you have any history of lung disease?"  (e.g., pulmonary embolus, asthma, emphysema)     Asthma- rare as adult 8. CAUSE: "What do you think is causing the breathing problem?"      Ingested water in lung 9. OTHER SYMPTOMS: "Do you have any other symptoms? (e.g., dizziness, runny nose, cough, chest pain, fever)     cough 10. PREGNANCY: "Is there any chance you are pregnant?" "When was your last menstrual period?"       n/a 11. TRAVEL: "Have you traveled out of the country in the last month?" (e.g., travel history, exposures)       no  Protocols used: BREATHING DIFFICULTY-A-AH

## 2018-10-04 NOTE — Progress Notes (Signed)
Chief Complaint  Patient presents with  . Wheezing  . Cough    Annie Paras here for URI complaints. Due to COVID-19 pandemic, we are interacting via telephone. I verified patient's ID using 2 identifiers. Patient agreed to proceed with visit via this method. Patient is at home, I am at office. Patient and I are present for visit.   Duration: 10 days  Associated symptoms: wheezing and cough  Started after he inhaled pool water.  Denies: sinus congestion, sinus pain, rhinorrhea, itchy watery eyes, ear pain, ear drainage, sore throat, myalgia and fevers Treatment to date: doxy, albuterol, Mucinex DM   Sick contacts: No  ROS:  Const: Denies fevers HEENT: As noted in HPI Lungs: No SOB  Past Medical History:  Diagnosis Date  . Childhood asthma   . Chronic renal insufficiency   . Fracture of base of fifth metacarpal bone of right hand   . GERD (gastroesophageal reflux disease)   . Hypertension   . Positive TB test    Exam No conversational dyspnea Age appropriate judgment and insight Nml affect and mood  Pneumonitis - Plan: predniSONE (DELTASONE) 20 MG tablet; 5 d burst, dx stemmed from chlorine IMO, pred should help with inflammation. Dr. Lorelei Pont has rx'd doxy which should help prevent development of PNA. Cont this.   Total time: 11 min F/u prn early next week if no improvement.  Pt voiced understanding and agreement to the plan.  [place], DO 10/04/18 4:37 PM

## 2018-10-23 ENCOUNTER — Ambulatory Visit: Payer: Self-pay | Admitting: General Surgery

## 2018-10-23 ENCOUNTER — Encounter (HOSPITAL_COMMUNITY): Payer: Self-pay

## 2018-10-23 NOTE — H&P (View-Only) (Signed)
Erik Weber Documented: 09/20/2018 9:42 AM Location: [facility] Patient #: [id number] DOB: 1975-07-10 Single / Language: Erik Weber / Race: Black or African American Male   History of Present Illness Erik Hiss M. Wilson MD; 09/20/2018 10:25 AM) The patient is a 43 year old male who presents with obesity. The patient comes in to establish care with me and to follow up with [place] regarding his struggle with obesity. He initially met Erik. Excell Weber about 13 months ago by Erik. Excell Weber retired recently so his care has been transferred to me. He denies any medical changes since we last saw him other than undergoing a sleep study. He has not heard the results but the results showed moderate obstructive sleep apnea with mild oxygen desaturation. He is still interested in gastric bypass. He is still struggling to achieve significant weight loss currently. He denies any chest pain, chest pressure, source of breath, orthopnea, paroxysmal internal dyspnea. He does sleep on 2 pillows but mainly that [place] to help with reflux. He denies any personal history of blood clots. His father had a PE. He states that his father travels a lot but otherwise no other family members have had blood clots. He denies any abdominal pain, melena or hematochezia. He does still have daily reflux. He takes omeprazole once a day. He states that he is regurgitating some solid food 3-4 times a week. Liquids go down well. 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 His upper GI in March of this year showed no stricture with liquids or hiatal hernia. He denies any dysuria or hematuria. He denies any joint pain. He denies any severe headaches such as migraines. He denies any TIAs or amaurosis fugax.  He drinks alcohol infrequently. He denies any drug or tobacco use.  He had blood work in July which showed a normal competent some metabolic panel except for his chronic renal  insufficiency. His creatinine was 1.67 which is his baseline. A1c was 4.7. Lipid panel showed a triglyceride level of 247, HDL level 36, total cholesterol 199  He has had a normal TSH and CBC in October of last year.  september 2019 Patient is referred by Erik Weber for consideration for surgical treatment for morbid obesity. The patient gives a history of progressive obesity since adolescence despite multiple attempts at medical management. He has been through numerous efforts at weight loss often using medications and is able to get down to about 230 pounds but then when he stops the medication his weight rapidly begins to increase. His current weight of 277 is near his maximum of 280. BMI is 37.5. Obesity has been affecting the patient in a number of ways including fatigue and some increased difficulty with routine activities. 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 He has had several endoscopies and previous dilatation and currently has some dysphagia and another dilatation scheduled. He has ongoing heartburn symptoms not completely relieved with medications. He has never been tested for obstructive sleep apnea but has been told he has choking spells and stops breathing at night and has a highly positive stop bang score in the office today. The patient has been to our initial information seminar, researched surgical options thoroughly, and is interested in likely gastric bypass as he understands that reflux can be an issue with the sleeve.    Problem List/Past Medical Erik Hiss M. Erik Pulling, MD; 09/20/2018 10:52 AM) Erik Weber (  E78.00)  HYPERTENSION, ESSENTIAL, BENIGN (I10)  DYSPNEA ON EXERTION (R06.00)  MORBID (SEVERE) OBESITY DUE TO EXCESS CALORIES (E66.01)  I believe the patient makes weight loss surgery criteria. We reviewed his preoperative workup including his upper  endoscopy from 2018, upper GI, chest x-ray, EKG and blood work. All of his questions were asked and answered. he was given his MBSAQIP risk/benefit report. REFLUX ESOPHAGITIS (K21.0)  OSA (OBSTRUCTIVE SLEEP APNEA) (G47.33)  moderate OSA on home sleep study; they are calling him to discuss trial of CPAP  Past Surgical History (Erik Weber, Erik Weber; 09/20/2018 9:42 AM) No pertinent past surgical history   Diagnostic Studies History Erik Hiss M. Erik Pulling, MD; 09/20/2018 10:52 AM) Colonoscopy  never  Allergies (Erik Weber, [place]; 09/20/2018 9:44 AM) No Known Drug Allergies  [11/02/2017]: Allergies Reconciled   Medication History (Erik Weber, [place]; 09/20/2018 9:44 AM) No Current Medications Medications Reconciled  Social History (Erik Weber, [place]; 09/20/2018 9:42 AM) Alcohol use  Occasional alcohol use. Caffeine use  Carbonated beverages. No drug use  Tobacco use  Never smoker.  Family History (Erik Weber, [place]; 09/20/2018 9:42 AM) Arthritis  Father, Mother. Cancer  Mother. Heart disease in male family member before age 40   Other Problems Erik Weber. Erik Pulling, MD; 09/20/2018 10:52 AM) Gastroesophageal Reflux Disease  High blood pressure  Asthma  Back Pain   Vitals (Erik A. Erik Weber RMA; 09/20/2018 9:43 AM) 09/20/2018 9:43 AM Weight: 276 lb Height: 71.5in Body Surface Area: 2.43 m Body Mass Index: 37.96 kg/m  Temp.: 97.78F  Pulse: 94 (Regular)  BP: 136/84(Sitting, Left Arm, Standard)       Physical Exam Erik Hiss M. Wilson MD; 09/20/2018 10:16 AM) General Mental Status-Alert. General Appearance-Consistent with stated age. Hydration-Well hydrated. Voice-Normal. Note: obese   Head and Neck Head-normocephalic, atraumatic with no lesions or palpable masses. Trachea-midline. Thyroid Gland Characteristics - normal size and consistency.  Eye Eyeball - Bilateral-Extraocular movements intact. Sclera/Conjunctiva -  Bilateral-No scleral icterus.  ENMT Ears Pinna - Bilateral - no bony growth in lateral aspect of ear canal, no edema. Nose and Sinuses External Inspection of the Nose - symmetric, no crepitus palpated.  Chest and Lung Exam Chest and lung exam reveals -quiet, even and easy respiratory effort with no use of accessory muscles and on auscultation, normal breath sounds, no adventitious sounds and normal vocal resonance. Inspection Chest Wall - Normal. Back - normal.  Breast - Did not examine.  Cardiovascular Cardiovascular examination reveals -normal heart sounds, regular rate and rhythm with no murmurs and normal pedal pulses bilaterally.  Abdomen Inspection Inspection of the abdomen reveals - No Hernias. Skin - Scar - no surgical scars. Palpation/Percussion Palpation and Percussion of the abdomen reveal - Soft, Non Tender, No Rebound tenderness, No Rigidity (guarding) and No hepatosplenomegaly. Auscultation Auscultation of the abdomen reveals - Bowel sounds normal.  Peripheral Vascular Upper Extremity Palpation - Pulses bilaterally normal.  Neurologic Neurologic evaluation reveals -alert and oriented x 3 with no impairment of recent or remote memory. Mental Status-Normal.  Neuropsychiatric The patient's mood and affect are described as -normal. Judgment and Insight-insight is appropriate concerning matters relevant to self.  Musculoskeletal Normal Exam - Left-Upper Extremity Strength Normal and Lower Extremity Strength Normal. Normal Exam - Right-Upper Extremity Strength Normal and Lower Extremity Strength Normal.  Lymphatic Head & Neck  General Head & Neck Lymphatics: Bilateral - Description - Normal. Axillary - Did not examine. Femoral & Inguinal - Did not examine.    Assessment & Plan Erik Hiss M. Erik Pulling  MD; 09/20/2018 10:52 AM)  OSA (OBSTRUCTIVE SLEEP APNEA) (G47.33) Story: moderate OSA on home sleep study; they are calling him to discuss trial of  CPAP   MORBID (SEVERE) OBESITY DUE TO EXCESS CALORIES (E66.01) Story: I believe the patient makes weight loss surgery criteria. We reviewed his preoperative workup including his upper endoscopy from 2018, upper GI, chest x-ray, EKG and blood work. All of his questions were asked and answered. he was given his MBSAQIP risk/benefit report. Impression: The patient meets weight loss surgery criteria. I think the patient would be an acceptable candidate for Laparoscopic Roux-en-Y Gastric bypass.  We discussed laparoscopic Roux-en-Y gastric bypass. We discussed the preoperative, operative and postoperative process. Using diagrams, I explained the surgery in detail including the performance of an EGD near the end of the surgery. We discussed the typical hospital course including a 2-3 day stay baring any complications. The patient was given educational material. I quoted the patient that they can expect to lose 50-70% of their excess weight with the gastric bypass. We did discuss the possibility of weight regain several years after the procedure.  We discussed the risk and benefits of surgery including but not limited to anesthesia risk, bleeding, infection, anastomotic edema requiring a few additional days in the hospital, postop nausea, possible conversion to open procedure, blood clot formation, anastomotic leak, anastomotic stricture, ulcer formation, death, respiratory complications, intestinal blockage, internal hernia, gallstone formation, vitamin and nutritional deficiencies, hair loss, weight regain injury to surrounding structures, failure to lose weight and mood changes.  We discussed that before and after surgery that there would be an alteration in their diet. I explained that we have put them on a diet 2 weeks before surgery. I also explained that they would be on a liquid diet for 2 weeks after surgery. We discussed that they would have to avoid certain foods such as sugar after surgery. We  discussed the importance of physical activity as well as compliance with our dietary and supplement recommendations and routine follow-up.  Current Plans Pt Education - EMW_preopbariatric  HYPERTENSION, ESSENTIAL, BENIGN (I10)   HYPERCHOLESTEREMIA (E78.00)   REFLUX ESOPHAGITIS (K21.0) Impression: I agree with Erik. Excell Weber that given the patient's long-standing history of reflux that he would be better served with a gastric bypass as opposed to a sleeve gastrectomy  Erik Weber. Erik Pulling, MD, FACS General, Bariatric, & Minimally Invasive Surgery [facility], [place]

## 2018-10-23 NOTE — H&P (Signed)
Erik Weber Documented: 09/20/2018 9:42 AM Location: [facility] Patient #: [id number] DOB: 12/14/1975 Single / Language: English / Race: Black or African American Male   History of Present Illness (Erik Mentink M. Umar Patmon MD; 09/20/2018 10:25 AM) The patient is a 42 year old male who presents with obesity. The patient comes in to establish care with me and to follow up with us regarding his struggle with obesity. He initially met Dr. Hoxworth about 13 months ago by Dr. Hoxworth retired recently so his care has been transferred to me. He denies any medical changes since we last saw him other than undergoing a sleep study. He has not heard the results but the results showed moderate obstructive sleep apnea with mild oxygen desaturation. He is still interested in gastric bypass. He is still struggling to achieve significant weight loss currently. He denies any chest pain, chest pressure, source of breath, orthopnea, paroxysmal internal dyspnea. He does sleep on 2 pillows but mainly that us to help with reflux. He denies any personal history of blood clots. His father had a PE. He states that his father travels a lot but otherwise no other family members have had blood clots. He denies any abdominal pain, melena or hematochezia. He does still have daily reflux. He takes omeprazole once a day. He states that he is regurgitating some solid food 3-4 times a week. Liquids go down well. 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 His upper GI in March of this year showed no stricture with liquids or hiatal hernia. He denies any dysuria or hematuria. He denies any joint pain. He denies any severe headaches such as migraines. He denies any TIAs or amaurosis fugax.  He drinks alcohol infrequently. He denies any drug or tobacco use.  He had blood work in July which showed a normal competent some metabolic panel except for his chronic renal  insufficiency. His creatinine was 1.67 which is his baseline. A1c was 4.7. Lipid panel showed a triglyceride level of 247, HDL level 36, total cholesterol 199  He has had a normal TSH and CBC in October of last year.  september 2019 Patient is referred by Dr Jessica Copeland for consideration for surgical treatment for morbid obesity. The patient gives a history of progressive obesity since adolescence despite multiple attempts at medical management. He has been through numerous efforts at weight loss often using medications and is able to get down to about 230 pounds but then when he stops the medication his weight rapidly begins to increase. His current weight of 277 is near his maximum of 280. BMI is 37.5. Obesity has been affecting the patient in a number of ways including fatigue and some increased difficulty with routine activities. 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 He has had several endoscopies and previous dilatation and currently has some dysphagia and another dilatation scheduled. He has ongoing heartburn symptoms not completely relieved with medications. He has never been tested for obstructive sleep apnea but has been told he has choking spells and stops breathing at night and has a highly positive stop bang score in the office today. The patient has been to our initial information seminar, researched surgical options thoroughly, and is interested in likely gastric bypass as he understands that reflux can be an issue with the sleeve.    Problem List/Past Medical (Erik Deeg M. Shavonta Gossen, MD; 09/20/2018 10:52 AM) HYPERCHOLESTEREMIA (  E78.00)  HYPERTENSION, ESSENTIAL, BENIGN (I10)  DYSPNEA ON EXERTION (R06.00)  MORBID (SEVERE) OBESITY DUE TO EXCESS CALORIES (E66.01)  I believe the patient makes weight loss surgery criteria. We reviewed his preoperative workup including his upper  endoscopy from 2018, upper GI, chest x-ray, EKG and blood work. All of his questions were asked and answered. he was given his MBSAQIP risk/benefit report. REFLUX ESOPHAGITIS (K21.0)  OSA (OBSTRUCTIVE SLEEP APNEA) (G47.33)  moderate OSA on home sleep study; they are calling him to discuss trial of CPAP  Past Surgical History (Erik Weber, RMA; 09/20/2018 9:42 AM) No pertinent past surgical history   Diagnostic Studies History (Erik Depp M. Savanna Dooley, MD; 09/20/2018 10:52 AM) Colonoscopy  never  Allergies (Erik Weber, RMA; 09/20/2018 9:44 AM) No Known Drug Allergies  [11/02/2017]: Allergies Reconciled   Medication History (Erik Weber, RMA; 09/20/2018 9:44 AM) No Current Medications Medications Reconciled  Social History (Erik Weber, RMA; 09/20/2018 9:42 AM) Alcohol use  Occasional alcohol use. Caffeine use  Carbonated beverages. No drug use  Tobacco use  Never smoker.  Family History (Erik Weber, RMA; 09/20/2018 9:42 AM) Arthritis  Father, Mother. Cancer  Mother. Heart disease in male family member before age 55   Other Problems (Erik Heslin M. Olie Dibert, MD; 09/20/2018 10:52 AM) Gastroesophageal Reflux Disease  High blood pressure  Asthma  Back Pain   Vitals (Erik Weber RMA; 09/20/2018 9:43 AM) 09/20/2018 9:43 AM Weight: 276 lb Height: 71.5in Body Surface Area: 2.43 m Body Mass Index: 37.96 kg/m  Temp.: 97.6F  Pulse: 94 (Regular)  BP: 136/84(Sitting, Left Arm, Standard)       Physical Exam (Erik Wiederhold M. Jameca Chumley MD; 09/20/2018 10:16 AM) General Mental Status-Alert. General Appearance-Consistent with stated age. Hydration-Well hydrated. Voice-Normal. Note: obese   Head and Neck Head-normocephalic, atraumatic with no lesions or palpable masses. Trachea-midline. Thyroid Gland Characteristics - normal size and consistency.  Eye Eyeball - Bilateral-Extraocular movements intact. Sclera/Conjunctiva -  Bilateral-No scleral icterus.  ENMT Ears Pinna - Bilateral - no bony growth in lateral aspect of ear canal, no edema. Nose and Sinuses External Inspection of the Nose - symmetric, no crepitus palpated.  Chest and Lung Exam Chest and lung exam reveals -quiet, even and easy respiratory effort with no use of accessory muscles and on auscultation, normal breath sounds, no adventitious sounds and normal vocal resonance. Inspection Chest Wall - Normal. Back - normal.  Breast - Did not examine.  Cardiovascular Cardiovascular examination reveals -normal heart sounds, regular rate and rhythm with no murmurs and normal pedal pulses bilaterally.  Abdomen Inspection Inspection of the abdomen reveals - No Hernias. Skin - Scar - no surgical scars. Palpation/Percussion Palpation and Percussion of the abdomen reveal - Soft, Non Tender, No Rebound tenderness, No Rigidity (guarding) and No hepatosplenomegaly. Auscultation Auscultation of the abdomen reveals - Bowel sounds normal.  Peripheral Vascular Upper Extremity Palpation - Pulses bilaterally normal.  Neurologic Neurologic evaluation reveals -alert and oriented x 3 with no impairment of recent or remote memory. Mental Status-Normal.  Neuropsychiatric The patient's mood and affect are described as -normal. Judgment and Insight-insight is appropriate concerning matters relevant to self.  Musculoskeletal Normal Exam - Left-Upper Extremity Strength Normal and Lower Extremity Strength Normal. Normal Exam - Right-Upper Extremity Strength Normal and Lower Extremity Strength Normal.  Lymphatic Head & Neck  General Head & Neck Lymphatics: Bilateral - Description - Normal. Axillary - Did not examine. Femoral & Inguinal - Did not examine.    Assessment & Plan (Erik Brunsman M. Arlon Bleier   MD; 09/20/2018 10:52 AM)  OSA (OBSTRUCTIVE SLEEP APNEA) (G47.33) Story: moderate OSA on home sleep study; they are calling him to discuss trial of  CPAP   MORBID (SEVERE) OBESITY DUE TO EXCESS CALORIES (E66.01) Story: I believe the patient makes weight loss surgery criteria. We reviewed his preoperative workup including his upper endoscopy from 2018, upper GI, chest x-ray, EKG and blood work. All of his questions were asked and answered. he was given his MBSAQIP risk/benefit report. Impression: The patient meets weight loss surgery criteria. I think the patient would be an acceptable candidate for Laparoscopic Roux-en-Y Gastric bypass.  We discussed laparoscopic Roux-en-Y gastric bypass. We discussed the preoperative, operative and postoperative process. Using diagrams, I explained the surgery in detail including the performance of an EGD near the end of the surgery. We discussed the typical hospital course including a 2-3 day stay baring any complications. The patient was given educational material. I quoted the patient that they can expect to lose 50-70% of their excess weight with the gastric bypass. We did discuss the possibility of weight regain several years after the procedure.  We discussed the risk and benefits of surgery including but not limited to anesthesia risk, bleeding, infection, anastomotic edema requiring a few additional days in the hospital, postop nausea, possible conversion to open procedure, blood clot formation, anastomotic leak, anastomotic stricture, ulcer formation, death, respiratory complications, intestinal blockage, internal hernia, gallstone formation, vitamin and nutritional deficiencies, hair loss, weight regain injury to surrounding structures, failure to lose weight and mood changes.  We discussed that before and after surgery that there would be an alteration in their diet. I explained that we have put them on a diet 2 weeks before surgery. I also explained that they would be on a liquid diet for 2 weeks after surgery. We discussed that they would have to avoid certain foods such as sugar after surgery. We  discussed the importance of physical activity as well as compliance with our dietary and supplement recommendations and routine follow-up.  Current Plans Pt Education - EMW_preopbariatric  HYPERTENSION, ESSENTIAL, BENIGN (I10)   HYPERCHOLESTEREMIA (E78.00)   REFLUX ESOPHAGITIS (K21.0) Impression: I agree with Dr. Excell Seltzer that given the patient's long-standing history of reflux that he would be better served with a gastric bypass as opposed to a sleeve gastrectomy  [name]. [name], MD, FACS General, Bariatric, & Minimally Invasive Surgery [facility], [place]

## 2018-10-23 NOTE — Patient Instructions (Addendum)
DUE TO COVID-19 ONLY ONE VISITOR IS ALLOWED TO COME WITH YOU AND STAY IN THE WAITING ROOM ONLY DURING PRE OP AND PROCEDURE. THE ONE VISITOR MAY VISIT WITH YOU IN YOUR PRIVATE ROOM DURING VISITING HOURS ONLY!!   COVID SWAB TESTING MUST BE COMPLETED ON: Today immediately after pre op appointment.    [address] [place] [facility] enter pre surgical testing line (Must self quarantine after testing. Follow instructions on handout.)             Your procedure is scheduled on: Monday, Sept. 21, 2020   Report to [facility] Main  Entrance   Report to Short Stay at 5:30 AM   NO SOLID FOOD AFTER 600 PM THE NIGHT BEFORE YOUR SURGERY.    YOU MAY DRINK CLEAR FLUIDS.    CLEAR LIQUID DIET  Foods Allowed                                                                     Foods Excluded  Water, Black Coffee and tea, regular and decaf                             liquids that you cannot  Plain Jell-O in any flavor  (No red)                                           see through such as: Fruit ices (not with fruit pulp)                                     milk, soups, orange juice  Iced Popsicles (No red)                                    All solid food Carbonated beverages, regular and diet                                    Apple juices Sports drinks like Gatorade (No red) Lightly seasoned clear broth or consume(fat free) Sugar, honey syrup  Sample Menu Breakfast                                Lunch                                     Supper Cranberry juice                    Beef broth                            Chicken broth Jell-O  Grape juice                           Apple juice Coffee or tea                        Jell-O                                      Popsicle                                                Coffee or tea                        Coffee or tea   MORNING OF SURGERY DRINK:   DRINK 1 G2 drink BEFORE  YOU LEAVE HOME, DRINK ALL OF THE  G2 DRINK AT ONE TIME.     THIS WILL BE THE LAST FLUIDS YOU DRINK BEFORE SURGERY.    PAIN IS EXPECTED AFTER SURGERY AND WILL NOT BE COMPLETELY ELIMINATED. AMBULATION AND TYLENOL WILL HELP REDUCE INCISIONAL AND GAS PAIN. MOVEMENT IS KEY!   YOU ARE EXPECTED TO BE OUT OF BED WITHIN 4 HOURS OF ADMISSION TO YOUR PATIENT ROOM.   SITTING IN THE RECLINER THROUGHOUT THE DAY IS IMPORTANT FOR DRINKING FLUIDS AND MOVING GAS THROUGHOUT THE GI TRACT.   COMPRESSION STOCKINGS SHOULD BE WORN [place] UNLESS YOU ARE WALKING.    INCENTIVE SPIROMETER SHOULD BE USED EVERY HOUR WHILE AWAKE TO DECREASE POST-OPERATIVE COMPLICATIONS SUCH AS PNEUMONIA.   WHEN DISCHARGED HOME, IT IS IMPORTANT TO CONTINUE TO WALK EVERY HOUR AND USE THE INCENTIVE SPIROMETER EVERY HOUR.    Call this number if you have problems the morning of surgery [phone]   Do not eat food or drink liquids :After Midnight.   Brush your teeth the morning of surgery.   Do NOT smoke after Midnight   Take these medicines the morning of surgery with A SIP OF WATER: Amlodipine, Omeprazole   Bring Asthma inhaler day of surgery                               You may not have any metal on your body including jewelry, and body piercings             Do not wear lotions, powders, perfumes/cologne, or deodorant                         Men may shave face and neck.   Do not bring valuables to the hospital. [place].   Contacts, dentures or bridgework may not be worn into surgery.   Bring small overnight bag day of surgery.    Special Instructions: Bring a copy of your healthcare power of attorney and living will documents         the day of surgery if you haven't scanned them in before.              Please read over the following  fact sheets you were given:  [facility] - Preparing for Surgery Before surgery, you can play an important role.   Because skin is not sterile, your skin needs to be as free of germs as possible.  You can reduce the number of germs on your skin by washing with CHG (chlorahexidine gluconate) soap before surgery.  CHG is an antiseptic cleaner which kills germs and bonds with the skin to continue killing germs even after washing. Please DO NOT use if you have an allergy to CHG or antibacterial soaps.  If your skin becomes reddened/irritated stop using the CHG and inform your nurse when you arrive at Short Stay. Do not shave (including legs and underarms) for at least 48 hours prior to the first CHG shower.  You may shave your face/neck.  Please follow these instructions carefully:  1.  Shower with CHG Soap the night before surgery and the  morning of surgery.  2.  If you choose to wash your hair, wash your hair first as usual with your normal  shampoo.  3.  After you shampoo, rinse your hair and body thoroughly to remove the shampoo.                             4.  Use CHG as you would any other liquid soap.  You can apply chg directly to the skin and wash.  Gently with a scrungie or clean washcloth.  5.  Apply the CHG Soap to your body ONLY FROM THE NECK DOWN.   Do   not use on face/ open                           Wound or open sores. Avoid contact with eyes, ears mouth and   genitals (private parts).                       Wash face,  Genitals (private parts) with your normal soap.             6.  Wash thoroughly, paying special attention to the area where your    surgery  will be performed.  7.  Thoroughly rinse your body with warm water from the neck down.  8.  DO NOT shower/wash with your normal soap after using and rinsing off the CHG Soap.                9.  Pat yourself dry with a clean towel.            10.  Wear clean pajamas.            11.  Place clean sheets on your bed the night of your first shower and do not  sleep with pets. Day of Surgery : Do not apply any lotions/deodorants the morning of  surgery.  Please wear clean clothes to the hospital/surgery center.  FAILURE TO FOLLOW THESE INSTRUCTIONS MAY RESULT IN THE CANCELLATION OF YOUR SURGERY  PATIENT SIGNATURE_________________________________  NURSE SIGNATURE__________________________________  ________________________________________________________________________  WHAT IS A BLOOD TRANSFUSION? Blood Transfusion Information  A transfusion is the replacement of blood or some of its parts. Blood is made up of multiple cells which provide different functions.  Red blood cells carry oxygen and are used for blood loss replacement.  White blood cells fight against infection.  Platelets control bleeding.  Plasma helps clot blood.  Other blood  products are available for specialized needs, such as hemophilia or other clotting disorders. BEFORE THE TRANSFUSION  Who gives blood for transfusions?   Healthy volunteers who are fully evaluated to make sure their blood is safe. This is blood bank blood. Transfusion therapy is the safest it has ever been in the practice of medicine. Before blood is taken from a donor, a complete history is taken to make sure that person has no history of diseases nor engages in risky social behavior (examples are intravenous drug use or sexual activity with multiple partners). The donor's travel history is screened to minimize risk of transmitting infections, such as malaria. The donated blood is tested for signs of infectious diseases, such as HIV and hepatitis. The blood is then tested to be sure it is compatible with you in order to minimize the chance of a transfusion reaction. If you or a relative donates blood, this is often done in anticipation of surgery and is not appropriate for emergency situations. It takes many days to process the donated blood. RISKS AND COMPLICATIONS Although transfusion therapy is very safe and saves many lives, the main dangers of transfusion include:   Getting an  infectious disease.  Developing a transfusion reaction. This is an allergic reaction to something in the blood you were given. Every precaution is taken to prevent this. The decision to have a blood transfusion has been considered carefully by your caregiver before blood is given. Blood is not given unless the benefits outweigh the risks. AFTER THE TRANSFUSION  Right after receiving a blood transfusion, you will usually feel much better and more energetic. This is especially true if your red blood cells have gotten low (anemic). The transfusion raises the level of the red blood cells which carry oxygen, and this usually causes an energy increase.  The nurse administering the transfusion will monitor you carefully for complications. HOME CARE INSTRUCTIONS  No special instructions are needed after a transfusion. You may find your energy is better. Speak with your caregiver about any limitations on activity for underlying diseases you may have. SEEK MEDICAL CARE IF:   Your condition is not improving after your transfusion.  You develop redness or irritation at the intravenous (IV) site. SEEK IMMEDIATE MEDICAL CARE IF:  Any of the following symptoms occur over the next 12 hours:  Shaking chills.  You have a temperature by mouth above 102 F (38.9 C), not controlled by medicine.  Chest, back, or muscle pain.  People around you feel you are not acting correctly or are confused.  Shortness of breath or difficulty breathing.  Dizziness and fainting.  You get a rash or develop hives.  You have a decrease in urine output.  Your urine turns a dark color or changes to pink, red, or brown. Any of the following symptoms occur over the next 10 days:  You have a temperature by mouth above 102 F (38.9 C), not controlled by medicine.  Shortness of breath.  Weakness after normal activity.  The white part of the eye turns yellow (jaundice).  You have a decrease in the amount of urine or  are urinating less often.  Your urine turns a dark color or changes to pink, red, or brown. Document Released: 01/21/2000 Document Revised: 04/17/2011 Document Reviewed: 09/09/2007 [facility] Patient Information 2014 [place], [place].  _______________________________________________________________________

## 2018-10-24 ENCOUNTER — Other Ambulatory Visit: Payer: Self-pay

## 2018-10-24 ENCOUNTER — Other Ambulatory Visit (HOSPITAL_COMMUNITY)
Admission: RE | Admit: 2018-10-24 | Discharge: 2018-10-24 | Disposition: A | Discharge status: Home / Self Care | Payer: BC Managed Care – PPO | Source: Ambulatory Visit | Attending: General Surgery | Admitting: General Surgery

## 2018-10-24 ENCOUNTER — Encounter (HOSPITAL_COMMUNITY)
Admission: RE | Admit: 2018-10-24 | Discharge: 2018-10-24 | Disposition: A | Discharge status: Home / Self Care | Payer: BC Managed Care – PPO | Source: Ambulatory Visit | Attending: General Surgery | Admitting: General Surgery

## 2018-10-24 ENCOUNTER — Encounter (HOSPITAL_COMMUNITY): Payer: Self-pay

## 2018-10-24 ENCOUNTER — Other Ambulatory Visit (HOSPITAL_COMMUNITY): Payer: BC Managed Care – PPO

## 2018-10-24 DIAGNOSIS — Z20828 Contact with and (suspected) exposure to other viral communicable diseases: Secondary | ICD-10-CM | POA: Diagnosis not present

## 2018-10-24 DIAGNOSIS — Z01818 Encounter for other preprocedural examination: Secondary | ICD-10-CM | POA: Insufficient documentation

## 2018-10-24 HISTORY — DX: Sleep apnea, unspecified: G47.30

## 2018-10-24 HISTORY — DX: Morbid (severe) obesity due to excess calories: E66.01

## 2018-10-24 LAB — CBC WITH DIFFERENTIAL/PLATELET
Abs Immature Granulocytes: 0.02 10*3/uL (ref 0.00–0.07)
Basophils Absolute: 0.1 10*3/uL (ref 0.0–0.1)
Basophils Relative: 1 %
Eosinophils Absolute: 0.2 10*3/uL (ref 0.0–0.5)
Eosinophils Relative: 3 %
HCT: 45.9 % (ref 39.0–52.0)
Hemoglobin: 14.5 g/dL (ref 13.0–17.0)
Immature Granulocytes: 0 %
Lymphocytes Relative: 43 %
Lymphs Abs: 3 10*3/uL (ref 0.7–4.0)
MCH: 27 pg (ref 26.0–34.0)
MCHC: 31.6 g/dL (ref 30.0–36.0)
MCV: 85.3 fL (ref 80.0–100.0)
Monocytes Absolute: 0.5 10*3/uL (ref 0.1–1.0)
Monocytes Relative: 8 %
Neutro Abs: 3.1 10*3/uL (ref 1.7–7.7)
Neutrophils Relative %: 45 %
Platelets: 267 10*3/uL (ref 150–400)
RBC: 5.38 MIL/uL (ref 4.22–5.81)
RDW: 13 % (ref 11.5–15.5)
WBC: 6.9 10*3/uL (ref 4.0–10.5)
nRBC: 0 % (ref 0.0–0.2)

## 2018-10-24 LAB — COMPREHENSIVE METABOLIC PANEL
ALT: 27 U/L (ref 0–44)
AST: 21 U/L (ref 15–41)
Albumin: 4.4 g/dL (ref 3.5–5.0)
Alkaline Phosphatase: 73 U/L (ref 38–126)
Anion gap: 9 (ref 5–15)
BUN: 13 mg/dL (ref 6–20)
CO2: 26 mmol/L (ref 22–32)
Calcium: 9.3 mg/dL (ref 8.9–10.3)
Chloride: 103 mmol/L (ref 98–111)
Creatinine, Ser: 1.52 mg/dL — ABNORMAL HIGH (ref 0.61–1.24)
GFR calc Af Amer: >60 mL/min (ref >60)
GFR calc non Af Amer: 55 mL/min — ABNORMAL LOW (ref >60)
Glucose, Bld: 96 mg/dL (ref 70–99)
Potassium: 4.2 mmol/L (ref 3.5–5.1)
Sodium: 138 mmol/L (ref 135–145)
Total Bilirubin: 0.6 mg/dL (ref 0.3–1.2)
Total Protein: 7.5 g/dL (ref 6.5–8.1)

## 2018-10-24 LAB — ABO/RH: ABO/RH(D): O POS

## 2018-10-24 NOTE — Progress Notes (Signed)
PCP - Dr. Janett Billow Copland last office visit 10/02/2018 note in epic Cardiologist - N/A  Chest x-ray - 04/09/2018 in epic EKG - 10/24/2018 in epic Stress Test -  N/A ECHO -  N/A Cardiac Cath -  N/A  Sleep Study - 09/10/2018 in epic CPAP - mild no CPAP ordered  Fasting Blood Sugar -  N/A Checks Blood Sugar _ N/A____ times a day  Blood Thinner Instructions:   N/A Aspirin Instructions: N/A Last Dose: N/A  Anesthesia review:  N/A  Patient denies shortness of breath, fever, cough and chest pain at PAT appointment   Patient verbalized understanding of instructions that were given to them at the PAT appointment. Patient was also instructed that they will need to review over the PAT instructions again at home before surgery.

## 2018-10-24 NOTE — Progress Notes (Signed)
SPOKE W/  [place]     SCREENING SYMPTOMS OF COVID 19:   COUGH--NO  RUNNY NOSE---NO   SORE THROAT---NO  NASAL CONGESTION----NO  SNEEZING----NO  SHORTNESS OF BREATH---NO  DIFFICULTY BREATHING---NO  TEMP >100.0 -----NO  UNEXPLAINED BODY ACHES------NO  CHILLS -------- NO  HEADACHES ---------NO  LOSS OF SMELL/ TASTE --------NO    HAVE YOU OR ANY FAMILY MEMBER TRAVELLED PAST 14 DAYS OUT OF THE   COUNTY---NO STATE----NO COUNTRY----NO  HAVE YOU OR ANY FAMILY MEMBER BEEN EXPOSED TO ANYONE WITH COVID 19? NO

## 2018-10-25 LAB — NOVEL CORONAVIRUS, NAA (HOSP ORDER, SEND-OUT TO REF LAB; TAT 18-24 HRS): SARS-CoV-2, NAA: NOT DETECTED

## 2018-10-27 MED ORDER — BUPIVACAINE LIPOSOME 1.3 % IJ SUSP
20.0000 mL | Freq: Once | INTRAMUSCULAR | Status: DC
  Filled 2018-10-27: qty 20

## 2018-10-28 ENCOUNTER — Inpatient Hospital Stay (HOSPITAL_COMMUNITY): Payer: BC Managed Care – PPO | Admitting: Anesthesiology

## 2018-10-28 ENCOUNTER — Other Ambulatory Visit: Payer: Self-pay

## 2018-10-28 ENCOUNTER — Encounter (HOSPITAL_COMMUNITY): Payer: Self-pay

## 2018-10-28 ENCOUNTER — Inpatient Hospital Stay (HOSPITAL_COMMUNITY)
Admission: RE | Admit: 2018-10-28 | Discharge: 2018-10-29 | DRG: 621 | Disposition: A | Discharge status: Home / Self Care | Payer: BC Managed Care – PPO | Attending: General Surgery | Admitting: General Surgery

## 2018-10-28 ENCOUNTER — Inpatient Hospital Stay (HOSPITAL_COMMUNITY): Payer: BC Managed Care – PPO | Admitting: Physician Assistant

## 2018-10-28 ENCOUNTER — Encounter (HOSPITAL_COMMUNITY)
Admission: RE | Disposition: A | Discharge status: Home / Self Care | Payer: Self-pay | Source: Home / Self Care | Attending: General Surgery

## 2018-10-28 DIAGNOSIS — I10 Essential (primary) hypertension: Secondary | ICD-10-CM | POA: Diagnosis not present

## 2018-10-28 DIAGNOSIS — G4733 Obstructive sleep apnea (adult) (pediatric): Secondary | ICD-10-CM | POA: Diagnosis present

## 2018-10-28 DIAGNOSIS — N259 Disorder resulting from impaired renal tubular function, unspecified: Secondary | ICD-10-CM | POA: Diagnosis present

## 2018-10-28 DIAGNOSIS — E785 Hyperlipidemia, unspecified: Secondary | ICD-10-CM | POA: Diagnosis not present

## 2018-10-28 DIAGNOSIS — E781 Pure hyperglyceridemia: Secondary | ICD-10-CM | POA: Diagnosis not present

## 2018-10-28 DIAGNOSIS — K210 Gastro-esophageal reflux disease with esophagitis: Secondary | ICD-10-CM | POA: Diagnosis not present

## 2018-10-28 DIAGNOSIS — I129 Hypertensive chronic kidney disease with stage 1 through stage 4 chronic kidney disease, or unspecified chronic kidney disease: Secondary | ICD-10-CM | POA: Diagnosis present

## 2018-10-28 DIAGNOSIS — E669 Obesity, unspecified: Secondary | ICD-10-CM | POA: Diagnosis present

## 2018-10-28 DIAGNOSIS — Z9884 Bariatric surgery status: Secondary | ICD-10-CM

## 2018-10-28 DIAGNOSIS — K219 Gastro-esophageal reflux disease without esophagitis: Secondary | ICD-10-CM | POA: Diagnosis present

## 2018-10-28 DIAGNOSIS — J45909 Unspecified asthma, uncomplicated: Secondary | ICD-10-CM | POA: Diagnosis not present

## 2018-10-28 DIAGNOSIS — Z6838 Body mass index (BMI) 38.0-38.9, adult: Secondary | ICD-10-CM | POA: Diagnosis not present

## 2018-10-28 DIAGNOSIS — N189 Chronic kidney disease, unspecified: Secondary | ICD-10-CM | POA: Diagnosis present

## 2018-10-28 HISTORY — DX: Bariatric surgery status: Z98.84

## 2018-10-28 HISTORY — PX: GASTRIC ROUX-EN-Y: SHX5262

## 2018-10-28 HISTORY — DX: Obstructive sleep apnea (adult) (pediatric): G47.33

## 2018-10-28 LAB — TYPE AND SCREEN
ABO/RH(D): O POS
Antibody Screen: NEGATIVE

## 2018-10-28 LAB — HEMOGLOBIN AND HEMATOCRIT, BLOOD
HCT: 44.7 % (ref 39.0–52.0)
Hemoglobin: 14.3 g/dL (ref 13.0–17.0)

## 2018-10-28 SURGERY — LAPAROSCOPIC ROUX-EN-Y GASTRIC BYPASS WITH UPPER ENDOSCOPY
Anesthesia: General | Site: Abdomen

## 2018-10-28 MED ORDER — KETAMINE HCL 10 MG/ML IJ SOLN
INTRAMUSCULAR | Status: AC
  Filled 2018-10-28: qty 1

## 2018-10-28 MED ORDER — FENTANYL CITRATE (PF) 250 MCG/5ML IJ SOLN
INTRAMUSCULAR | Status: AC
  Filled 2018-10-28: qty 5

## 2018-10-28 MED ORDER — OXYCODONE HCL 5 MG PO TABS: 5.0000 mg | ORAL_TABLET | Freq: Once | ORAL | Status: DC | PRN

## 2018-10-28 MED ORDER — OXYCODONE HCL 5 MG/5ML PO SOLN: 5.0000 mg | Freq: Four times a day (QID) | ORAL | Status: DC | PRN

## 2018-10-28 MED ORDER — SUCCINYLCHOLINE CHLORIDE 200 MG/10ML IV SOSY
PREFILLED_SYRINGE | INTRAVENOUS | Status: AC
  Filled 2018-10-28: qty 10

## 2018-10-28 MED ORDER — ENSURE MAX PROTEIN PO LIQD
2.0000 [oz_av] | ORAL | Status: DC
  Administered 2018-10-29 (×3): 2 [oz_av] via ORAL

## 2018-10-28 MED ORDER — AMLODIPINE BESYLATE 5 MG PO TABS
5.0000 mg | ORAL_TABLET | Freq: Every day | ORAL | Status: DC
  Administered 2018-10-29: 5 mg via ORAL

## 2018-10-28 MED ORDER — OXYCODONE HCL 5 MG/5ML PO SOLN: 5.0000 mg | Freq: Once | ORAL | Status: DC | PRN

## 2018-10-28 MED ORDER — ROCURONIUM BROMIDE 50 MG/5ML IV SOSY
PREFILLED_SYRINGE | INTRAVENOUS | Status: DC | PRN
  Administered 2018-10-28 (×2): 20 mg via INTRAVENOUS
  Administered 2018-10-28: 60 mg via INTRAVENOUS
  Administered 2018-10-28: 20 mg via INTRAVENOUS

## 2018-10-28 MED ORDER — APREPITANT 40 MG PO CAPS
40.0000 mg | ORAL_CAPSULE | ORAL | Status: AC
  Administered 2018-10-28: 40 mg via ORAL
  Filled 2018-10-28: qty 1

## 2018-10-28 MED ORDER — SUGAMMADEX SODIUM 200 MG/2ML IV SOLN
INTRAVENOUS | Status: DC | PRN
  Administered 2018-10-28: 300 mg via INTRAVENOUS

## 2018-10-28 MED ORDER — ONDANSETRON HCL 4 MG/2ML IJ SOLN
INTRAMUSCULAR | Status: AC
  Filled 2018-10-28: qty 2

## 2018-10-28 MED ORDER — GABAPENTIN 100 MG PO CAPS
200.0000 mg | ORAL_CAPSULE | Freq: Two times a day (BID) | ORAL | Status: DC
  Administered 2018-10-28 – 2018-10-29 (×3): 200 mg via ORAL
  Filled 2018-10-28 (×3): qty 2

## 2018-10-28 MED ORDER — GABAPENTIN 300 MG PO CAPS
300.0000 mg | ORAL_CAPSULE | ORAL | Status: AC
  Administered 2018-10-28: 300 mg via ORAL
  Filled 2018-10-28: qty 1

## 2018-10-28 MED ORDER — ONDANSETRON HCL 4 MG/2ML IJ SOLN: 4.0000 mg | Freq: Four times a day (QID) | INTRAMUSCULAR | Status: DC | PRN

## 2018-10-28 MED ORDER — SCOPOLAMINE 1 MG/3DAYS TD PT72
1.0000 | MEDICATED_PATCH | TRANSDERMAL | Status: DC
  Administered 2018-10-28: 1.5 mg via TRANSDERMAL
  Filled 2018-10-28: qty 1

## 2018-10-28 MED ORDER — CHLORHEXIDINE GLUCONATE 4 % EX LIQD: 60.0000 mL | Freq: Once | CUTANEOUS | Status: DC

## 2018-10-28 MED ORDER — SODIUM CHLORIDE (PF) 0.9 % IJ SOLN
INTRAMUSCULAR | Status: AC
  Filled 2018-10-28: qty 50

## 2018-10-28 MED ORDER — ROCURONIUM BROMIDE 10 MG/ML (PF) SYRINGE
PREFILLED_SYRINGE | INTRAVENOUS | Status: AC
  Filled 2018-10-28: qty 10

## 2018-10-28 MED ORDER — DIPHENHYDRAMINE HCL 50 MG/ML IJ SOLN: 12.5000 mg | Freq: Three times a day (TID) | INTRAMUSCULAR | Status: DC | PRN

## 2018-10-28 MED ORDER — ACETAMINOPHEN 500 MG PO TABS
1000.0000 mg | ORAL_TABLET | ORAL | Status: AC
  Administered 2018-10-28: 1000 mg via ORAL
  Filled 2018-10-28: qty 2

## 2018-10-28 MED ORDER — IRBESARTAN 150 MG PO TABS
150.0000 mg | ORAL_TABLET | Freq: Every day | ORAL | Status: DC
  Administered 2018-10-29: 150 mg via ORAL
  Filled 2018-10-28: qty 1

## 2018-10-28 MED ORDER — DEXAMETHASONE SODIUM PHOSPHATE 10 MG/ML IJ SOLN
INTRAMUSCULAR | Status: AC
  Filled 2018-10-28: qty 1

## 2018-10-28 MED ORDER — MIDAZOLAM HCL 2 MG/2ML IJ SOLN
INTRAMUSCULAR | Status: AC
  Filled 2018-10-28: qty 2

## 2018-10-28 MED ORDER — ONDANSETRON HCL 4 MG/2ML IJ SOLN
INTRAMUSCULAR | Status: DC | PRN
  Administered 2018-10-28: 4 mg via INTRAVENOUS

## 2018-10-28 MED ORDER — KCL IN DEXTROSE-NACL 20-5-0.45 MEQ/L-%-% IV SOLN
INTRAVENOUS | Status: DC
  Administered 2018-10-28 – 2018-10-29 (×3): via INTRAVENOUS
  Filled 2018-10-28 (×3): qty 1000

## 2018-10-28 MED ORDER — LIDOCAINE 2% (20 MG/ML) 5 ML SYRINGE
INTRAMUSCULAR | Status: DC | PRN
  Administered 2018-10-28: 100 mg via INTRAVENOUS

## 2018-10-28 MED ORDER — FENTANYL CITRATE (PF) 250 MCG/5ML IJ SOLN
INTRAMUSCULAR | Status: DC | PRN
  Administered 2018-10-28: 100 ug via INTRAVENOUS
  Administered 2018-10-28 (×3): 50 ug via INTRAVENOUS

## 2018-10-28 MED ORDER — SODIUM CHLORIDE 0.9 % IV SOLN
2.0000 g | INTRAVENOUS | Status: AC
  Administered 2018-10-28: 2 g via INTRAVENOUS
  Filled 2018-10-28: qty 2

## 2018-10-28 MED ORDER — LIDOCAINE 2% (20 MG/ML) 5 ML SYRINGE
INTRAMUSCULAR | Status: AC
  Filled 2018-10-28: qty 5

## 2018-10-28 MED ORDER — HYDRALAZINE HCL 20 MG/ML IJ SOLN: 10.0000 mg | INTRAMUSCULAR | Status: DC | PRN

## 2018-10-28 MED ORDER — MORPHINE SULFATE (PF) 4 MG/ML IV SOLN: 1.0000 mg | INTRAVENOUS | Status: DC | PRN

## 2018-10-28 MED ORDER — LABETALOL HCL 5 MG/ML IV SOLN
10.0000 mg | Freq: Once | INTRAVENOUS | Status: AC
  Administered 2018-10-28: 11:00:00 10 mg via INTRAVENOUS

## 2018-10-28 MED ORDER — ENOXAPARIN SODIUM 30 MG/0.3ML ~~LOC~~ SOLN
30.0000 mg | Freq: Two times a day (BID) | SUBCUTANEOUS | Status: DC
  Administered 2018-10-28 – 2018-10-29 (×2): 30 mg via SUBCUTANEOUS
  Filled 2018-10-28 (×3): qty 0.3

## 2018-10-28 MED ORDER — LABETALOL HCL 5 MG/ML IV SOLN
INTRAVENOUS | Status: AC
  Administered 2018-10-28: 10 mg via INTRAVENOUS
  Filled 2018-10-28: qty 4

## 2018-10-28 MED ORDER — ALBUTEROL SULFATE (2.5 MG/3ML) 0.083% IN NEBU: 3.0000 mL | INHALATION_SOLUTION | Freq: Four times a day (QID) | RESPIRATORY_TRACT | Status: DC | PRN

## 2018-10-28 MED ORDER — SUGAMMADEX SODIUM 500 MG/5ML IV SOLN
INTRAVENOUS | Status: AC
  Filled 2018-10-28: qty 5

## 2018-10-28 MED ORDER — KETAMINE HCL 10 MG/ML IJ SOLN
INTRAMUSCULAR | Status: DC | PRN
  Administered 2018-10-28: 30 mg via INTRAVENOUS

## 2018-10-28 MED ORDER — "VISTASEAL 4 ML SINGLE DOSE KIT "
4.0000 mL | PACK | Freq: Once | CUTANEOUS | Status: AC
  Administered 2018-10-28: 4 mL via TOPICAL
  Filled 2018-10-28: qty 4

## 2018-10-28 MED ORDER — LIDOCAINE 2% (20 MG/ML) 5 ML SYRINGE
INTRAMUSCULAR | Status: DC | PRN
  Administered 2018-10-28: 1.5 mg/kg/h via INTRAVENOUS

## 2018-10-28 MED ORDER — PROPOFOL 10 MG/ML IV BOLUS
INTRAVENOUS | Status: DC | PRN
  Administered 2018-10-28: 200 mg via INTRAVENOUS

## 2018-10-28 MED ORDER — PROMETHAZINE HCL 25 MG/ML IJ SOLN: 12.5000 mg | Freq: Four times a day (QID) | INTRAMUSCULAR | Status: DC | PRN

## 2018-10-28 MED ORDER — DEXAMETHASONE SODIUM PHOSPHATE 10 MG/ML IJ SOLN
INTRAMUSCULAR | Status: DC | PRN
  Administered 2018-10-28: 8 mg via INTRAVENOUS

## 2018-10-28 MED ORDER — LACTATED RINGERS IR SOLN
Status: DC | PRN
  Administered 2018-10-28: 1000 mL

## 2018-10-28 MED ORDER — MIDAZOLAM HCL 5 MG/5ML IJ SOLN
INTRAMUSCULAR | Status: DC | PRN
  Administered 2018-10-28: 2 mg via INTRAVENOUS

## 2018-10-28 MED ORDER — 0.9 % SODIUM CHLORIDE (POUR BTL) OPTIME
TOPICAL | Status: DC | PRN
  Administered 2018-10-28: 1000 mL

## 2018-10-28 MED ORDER — LIDOCAINE HCL 2 % IJ SOLN
INTRAMUSCULAR | Status: AC
  Filled 2018-10-28: qty 20

## 2018-10-28 MED ORDER — PANTOPRAZOLE SODIUM 40 MG IV SOLR
40.0000 mg | Freq: Every day | INTRAVENOUS | Status: DC
  Administered 2018-10-28: 40 mg via INTRAVENOUS
  Filled 2018-10-28: qty 40

## 2018-10-28 MED ORDER — DEXAMETHASONE SODIUM PHOSPHATE 4 MG/ML IJ SOLN: 4.0000 mg | INTRAMUSCULAR | Status: DC

## 2018-10-28 MED ORDER — PROPOFOL 10 MG/ML IV BOLUS
INTRAVENOUS | Status: AC
  Filled 2018-10-28: qty 20

## 2018-10-28 MED ORDER — LACTATED RINGERS IV SOLN
INTRAVENOUS | Status: DC
  Administered 2018-10-28 (×2): via INTRAVENOUS

## 2018-10-28 MED ORDER — BUPIVACAINE LIPOSOME 1.3 % IJ SUSP
INTRAMUSCULAR | Status: DC | PRN
  Administered 2018-10-28 (×2): 10 mL

## 2018-10-28 MED ORDER — FENTANYL CITRATE (PF) 100 MCG/2ML IJ SOLN
25.0000 ug | INTRAMUSCULAR | Status: DC | PRN
  Administered 2018-10-28 (×2): 50 ug via INTRAVENOUS

## 2018-10-28 MED ORDER — HEPARIN SODIUM (PORCINE) 5000 UNIT/ML IJ SOLN
5000.0000 [IU] | INTRAMUSCULAR | Status: AC
  Administered 2018-10-28: 5000 [IU] via SUBCUTANEOUS
  Filled 2018-10-28: qty 1

## 2018-10-28 MED ORDER — SODIUM CHLORIDE (PF) 0.9 % IJ SOLN
INTRAMUSCULAR | Status: DC | PRN
  Administered 2018-10-28: 50 mL

## 2018-10-28 MED ORDER — FENTANYL CITRATE (PF) 100 MCG/2ML IJ SOLN
INTRAMUSCULAR | Status: AC
  Administered 2018-10-28: 50 ug via INTRAVENOUS
  Filled 2018-10-28: qty 2

## 2018-10-28 MED ORDER — STERILE WATER FOR IRRIGATION IR SOLN
Status: DC | PRN
  Administered 2018-10-28: 2000 mL

## 2018-10-28 MED ORDER — ACETAMINOPHEN 500 MG PO TABS: 1000.0000 mg | ORAL_TABLET | Freq: Three times a day (TID) | ORAL | Status: DC

## 2018-10-28 MED ORDER — SIMETHICONE 80 MG PO CHEW
80.0000 mg | CHEWABLE_TABLET | Freq: Four times a day (QID) | ORAL | Status: DC | PRN
  Administered 2018-10-28 – 2018-10-29 (×2): 80 mg via ORAL
  Filled 2018-10-28 (×2): qty 1

## 2018-10-28 MED ORDER — ACETAMINOPHEN 160 MG/5ML PO SOLN
1000.0000 mg | Freq: Three times a day (TID) | ORAL | Status: DC
  Administered 2018-10-28 – 2018-10-29 (×2): 1000 mg via ORAL
  Filled 2018-10-28 (×3): qty 40.6

## 2018-10-28 SURGICAL SUPPLY — 79 items
APL LAPSCP 35 DL APL RGD (MISCELLANEOUS) ×2
APL PRP STRL LF DISP 70% ISPRP (MISCELLANEOUS) ×2
APL SKNCLS STERI-STRIP NONHPOA (GAUZE/BANDAGES/DRESSINGS) ×1
APL SWBSTK 6 STRL LF DISP (MISCELLANEOUS)
APPLICATOR COTTON TIP 6 STRL (MISCELLANEOUS) IMPLANT
APPLICATOR COTTON TIP 6IN STRL (MISCELLANEOUS)
APPLICATOR VISTASEAL 35 (MISCELLANEOUS) ×3 IMPLANT
APPLIER CLIP ROT 13.4 12 LRG (CLIP)
APR CLP LRG 13.4X12 ROT 20 MLT (CLIP)
BENZOIN TINCTURE PRP APPL 2/3 (GAUZE/BANDAGES/DRESSINGS) ×2 IMPLANT
BLADE SURG SZ11 CARB STEEL (BLADE) ×2 IMPLANT
BNDG ADH 1X3 SHEER STRL LF (GAUZE/BANDAGES/DRESSINGS) ×12 IMPLANT
BNDG ADH THN 3X1 STRL LF (GAUZE/BANDAGES/DRESSINGS) ×6
CABLE HIGH FREQUENCY MONO STRZ (ELECTRODE) IMPLANT
CHLORAPREP W/TINT 26 (MISCELLANEOUS) ×4 IMPLANT
CLIP APPLIE ROT 13.4 12 LRG (CLIP) IMPLANT
CLIP SUT LAPRA TY ABSORB (SUTURE) ×4 IMPLANT
COVER WAND RF STERILE (DRAPES) IMPLANT
CUTTER FLEX LINEAR 45M (STAPLE) ×1 IMPLANT
DEVICE SUT QUICK LOAD TK 5 (STAPLE) IMPLANT
DEVICE SUT TI-KNOT TK 5X26 (MISCELLANEOUS) IMPLANT
DEVICE SUTURE ENDOST 10MM (ENDOMECHANICALS) ×2 IMPLANT
DRAIN PENROSE 18X1/4 LTX STRL (WOUND CARE) ×2 IMPLANT
ELECT REM PT RETURN 15FT ADLT (MISCELLANEOUS) ×2 IMPLANT
GAUZE 4X4 16PLY RFD (DISPOSABLE) ×2 IMPLANT
GLOVE BIO SURGEON STRL SZ7.5 (GLOVE) IMPLANT
GLOVE INDICATOR 8.0 STRL GRN (GLOVE) ×2 IMPLANT
GOWN STRL REUS W/TWL XL LVL3 (GOWN DISPOSABLE) ×8 IMPLANT
HOVERMATT SINGLE USE (MISCELLANEOUS) ×2 IMPLANT
KIT BASIN OR (CUSTOM PROCEDURE TRAY) ×2 IMPLANT
KIT GASTRIC LAVAGE 34FR ADT (SET/KITS/TRAYS/PACK) ×2 IMPLANT
KIT TURNOVER KIT A (KITS) IMPLANT
MARKER SKIN DUAL TIP RULER LAB (MISCELLANEOUS) ×2 IMPLANT
NDL SPNL 22GX3.5 QUINCKE BK (NEEDLE) ×1 IMPLANT
NEEDLE SPNL 22GX3.5 QUINCKE BK (NEEDLE) ×2 IMPLANT
PACK CARDIOVASCULAR III (CUSTOM PROCEDURE TRAY) ×2 IMPLANT
PENCIL SMOKE EVACUATOR (MISCELLANEOUS) IMPLANT
RELOAD 45 VASCULAR/THIN (ENDOMECHANICALS) ×4 IMPLANT
RELOAD ENDO STITCH 2.0 (ENDOMECHANICALS) ×16
RELOAD STAPLE 45 2.5 WHT GRN (ENDOMECHANICALS) IMPLANT
RELOAD STAPLE 45 3.5 BLU ETS (ENDOMECHANICALS) IMPLANT
RELOAD STAPLE 60 2.6 WHT THN (STAPLE) ×2 IMPLANT
RELOAD STAPLE 60 3.6 BLU REG (STAPLE) ×2 IMPLANT
RELOAD STAPLE 60 3.8 GOLD REG (STAPLE) ×1 IMPLANT
RELOAD STAPLE TA45 3.5 REG BLU (ENDOMECHANICALS) ×2 IMPLANT
RELOAD STAPLER BLUE 60MM (STAPLE) ×4 IMPLANT
RELOAD STAPLER GOLD 60MM (STAPLE) ×1 IMPLANT
RELOAD STAPLER WHITE 60MM (STAPLE) ×2 IMPLANT
RELOAD SUT SNGL STCH ABSRB 2-0 (ENDOMECHANICALS) ×4 IMPLANT
RELOAD SUT SNGL STCH BLK 2-0 (ENDOMECHANICALS) ×4 IMPLANT
SCISSORS LAP 5X45 EPIX DISP (ENDOMECHANICALS) ×2 IMPLANT
SET IRRIG TUBING LAPAROSCOPIC (IRRIGATION / IRRIGATOR) ×2 IMPLANT
SET TUBE SMOKE EVAC HIGH FLOW (TUBING) ×2 IMPLANT
SHEARS HARMONIC ACE PLUS 45CM (MISCELLANEOUS) ×2 IMPLANT
SLEEVE XCEL OPT CAN 5 100 (ENDOMECHANICALS) ×2 IMPLANT
SOL ANTI FOG 6CC (MISCELLANEOUS) ×1 IMPLANT
SOLUTION ANTI FOG 6CC (MISCELLANEOUS) ×1
STAPLER ECHELON BIOABSB 60 FLE (MISCELLANEOUS) IMPLANT
STAPLER ECHELON LONG 60 440 (INSTRUMENTS) ×2 IMPLANT
STAPLER RELOAD BLUE 60MM (STAPLE) ×8
STAPLER RELOAD GOLD 60MM (STAPLE) ×2
STAPLER RELOAD WHITE 60MM (STAPLE) ×4
STRIP CLOSURE SKIN 1/2X4 (GAUZE/BANDAGES/DRESSINGS) ×2 IMPLANT
SUT MNCRL AB 4-0 PS2 18 (SUTURE) ×3 IMPLANT
SUT RELOAD ENDO STITCH 2 48X1 (ENDOMECHANICALS) ×4
SUT RELOAD ENDO STITCH 2.0 (ENDOMECHANICALS) ×4
SUT SURGIDAC NAB ES-9 0 48 120 (SUTURE) IMPLANT
SUT VIC AB 2-0 SH 27 (SUTURE) ×2
SUT VIC AB 2-0 SH 27X BRD (SUTURE) ×1 IMPLANT
SUTURE RELOAD END STTCH 2 48X1 (ENDOMECHANICALS) ×4 IMPLANT
SUTURE RELOAD ENDO STITCH 2.0 (ENDOMECHANICALS) ×4 IMPLANT
SYR 10ML ECCENTRIC (SYRINGE) ×2 IMPLANT
SYR 20ML LL LF (SYRINGE) ×4 IMPLANT
TOWEL OR 17X26 10 PK STRL BLUE (TOWEL DISPOSABLE) ×2 IMPLANT
TOWEL OR NON WOVEN STRL DISP B (DISPOSABLE) ×2 IMPLANT
TROCAR BLADELESS OPT 5 100 (ENDOMECHANICALS) ×2 IMPLANT
TROCAR UNIVERSAL OPT 12M 100M (ENDOMECHANICALS) ×6 IMPLANT
TROCAR XCEL 12X100 BLDLESS (ENDOMECHANICALS) ×2 IMPLANT
TUBING CONNECTING 10 (TUBING) ×3 IMPLANT

## 2018-10-28 NOTE — Anesthesia Procedure Notes (Signed)
Procedure Name: Intubation Date/Time: 10/28/2018 7:29 AM Performed by: Maxwell Caul, CRNA Pre-anesthesia Checklist: Patient identified, Emergency Drugs available, Suction available and Patient being monitored Patient Re-evaluated:Patient Re-evaluated prior to induction Oxygen Delivery Method: Circle system utilized Preoxygenation: Pre-oxygenation with 100% oxygen Induction Type: IV induction Ventilation: Mask ventilation without difficulty and Oral airway inserted - appropriate to patient size Laryngoscope Size: Mac and 4 Grade View: Grade I Tube type: Oral Tube size: 7.5 mm Number of attempts: 1 Airway Equipment and Method: Stylet and Oral airway Placement Confirmation: ETT inserted through vocal cords under direct vision,  positive ETCO2 and breath sounds checked- equal and bilateral Secured at: 21 cm Tube secured with: Tape Dental Injury: Teeth and Oropharynx as per pre-operative assessment

## 2018-10-28 NOTE — Interval H&P Note (Signed)
History and Physical Interval Note:  10/28/2018 7:10 AM  Erik Weber  has presented today for surgery, with the diagnosis of Morbid Obesity, OSA, HTN, Hypercholesterolemia.  The various methods of treatment have been discussed with the patient and family. After consideration of risks, benefits and other options for treatment, the patient has consented to  Procedure(s): LAPAROSCOPIC ROUX-EN-Y GASTRIC BYPASS WITH UPPER ENDOSCOPY (N/A) as a surgical intervention.  The patient's history has been reviewed, patient examined, no change in status, stable for surgery.  I have reviewed the patient's chart and labs.  Questions were answered to the patient's satisfaction.    [name]. [name], MD, FACS General, Bariatric, & Minimally Invasive Surgery [facility]   [place]

## 2018-10-28 NOTE — Op Note (Signed)
Preoperative diagnosis: Roux-en-Y gastric bypass  Postoperative diagnosis: Same   Procedure: Upper endoscopy   Surgeon: Gurney Maxin, M.D.  Anesthesia: Gen.   Indications for procedure: This patient was undergoing a Roux-en-Y gastric bypass.   Description of procedure: The endoscopy was placed in the mouth and into the oropharynx and under endoscopic vision it was advanced to the esophagogastric junction. The pouch was insufflated and no bleeding or bubbles were seen. The GEJ was identified at 39 cm from the teeth. The anastomosis was seen at 45 cm. No bleeding or leaks were detected. The scope was withdrawn without difficulty.   Gurney Maxin, M.D. General, Bariatric, & Minimally Invasive Surgery [facility]

## 2018-10-28 NOTE — Progress Notes (Signed)
PHARMACY CONSULT FOR:  Risk Assessment for Post-Discharge VTE Following Bariatric Surgery  Post-Discharge VTE Risk Assessment: This patient's probability of 30-day post-discharge VTE is increased due to the factors marked: x  Male    Age >/=60 years    BMI >/=50 kg/m2    CHF    Dyspnea at Rest    Paraplegia   x Non-gastric-band surgery    Operation Time >/=3 hr    Return to OR     Length of Stay >/= 3 d      Hx of VTE   Hypercoagulable condition   Significant venous stasis   Predicted probability of 30-day post-discharge VTE: 0.31%  Recommendation for Discharge: No pharmacologic prophylaxis post-discharge  Erik Weber is a 43 y.o. male who underwent Laparoscopic Roux-en-Y gastric bypass  On 9/21   Case start: 0748 Case end: 1005  No Known Allergies  Patient Measurements: Height: 5' 11"  (180.3 cm) Weight: 275 lb (124.7 kg) IBW/kg (Calculated) : 75.3 Body mass index is 38.35 kg/m.  Recent Labs    10/28/18 1042  HGB 14.3  HCT 44.7   Estimated Creatinine Clearance: 83.4 mL/min (A) (by C-G formula based on SCr of 1.52 mg/dL (H)).    Past Medical History:  Diagnosis Date  . Childhood asthma   . Chronic renal insufficiency   . Fracture of base of fifth metacarpal bone of right hand   . GERD (gastroesophageal reflux disease)   . Hypertension   . Morbid obesity ([place])   . Pneumonitis 10/04/2018   after accidental inhalation of swimming pool water during swimmng lesson  . 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 Sleep apnea    Mild, no cpap needed     Medications Prior to Admission  Medication Sig Dispense Refill Last Dose  . albuterol (VENTOLIN HFA) 108 (90 Base) MCG/ACT inhaler Inhale 2 puffs into the lungs every 6 (six) hours as needed for wheezing or shortness of breath. 18 g 1 Past Month at Unknown time  . amLODipine (NORVASC) 5 MG tablet Take 1 tablet (5 mg total) by mouth daily. 90 tablet 3 10/28/2018 at 0445  . olmesartan (BENICAR) 20 MG tablet  Take 1 tablet (20 mg total) by mouth daily. 90 tablet 3 10/27/2018 at Unknown time  . omeprazole (PRILOSEC) 40 MG capsule TAKE 1 CAPSULE BY MOUTH EVERY DAY (Patient taking differently: Take 40 mg by mouth daily. ) 90 capsule 1 10/28/2018 at 0445  . doxycycline (VIBRAMYCIN) 100 MG capsule Take 1 capsule (100 mg total) by mouth 2 (two) times daily. (Patient not taking: Reported on 10/23/2018) 20 capsule 0 Completed Course at Unknown time    Dolly Rias RPh 10/28/2018, 11:09 AM Pager [phone]

## 2018-10-28 NOTE — Op Note (Signed)
ASTOR GENTLE [id number] Oct 22, 1975. 10/28/2018  Preoperative diagnosis:    Essential hypertension   GERD   Disorder resulting from impaired renal function   Hypertriglyceridemia   Obesity BMI 38   OSA (obstructive sleep apnea)  Postoperative  diagnosis:  1. same  Surgical procedure: Laparoscopic Roux-en-Y gastric bypass (ante-colic, ante-gastric); upper endoscopy  Surgeon: Gayland Curry, M.D. FACS  Asst.: Gurney Maxin MD FACS  Anesthesia: General plus exparel/marcaine mix  Complications: None   EBL: Minimal   Drains: None   Disposition: PACU in good condition   Indications for procedure: 43 y.o. yo male with morbid obesity who has been unsuccessful at sustained weight loss. The patient's comorbidities are listed above. We discussed the risk and benefits of surgery including but not limited to anesthesia risk, bleeding, infection, blood clot formation, anastomotic leak, anastomotic stricture, ulcer formation, death, respiratory complications, intestinal blockage, internal hernia, gallstone formation, vitamin and nutritional deficiencies, injury to surrounding structures, failure to lose weight and mood changes.   Description of procedure: Patient is brought to the operating room and general anesthesia induced. The patient had received preoperative broad-spectrum IV antibiotics and subcutaneous heparin. The abdomen was widely sterilely prepped with Chloraprep and draped. Patient timeout was performed and correct patient and procedure confirmed. Access was obtained with a 12 mm Optiview trocar in the left upper quadrant and pneumoperitoneum established without difficulty. Under direct vision 12 mm trocars were placed laterally in the right upper quadrant, right upper quadrant midclavicular line, and to the left and above the umbilicus for the camera port. A 5 mm trocar was placed laterally in the left upper quadrant.  Exparel/marcaine mix was infiltrated in bilateral lateral  abdominal walls as a TAP block.  The omentum was brought into the upper abdomen and the transverse mesocolon elevated and the ligament of Treitz clearly identified. His abdomen did not distend to well but we did have enough working room. A 40 cm biliopancreatic limb was then carefully measured from the ligament of Treitz. The small intestine was divided at this point with two firings of a 45 mm white load linear stapler. A Penrose drain was sutured to the end of the Roux-en-Y limb for later identification. A 100 cm Roux-en-Y limb was then carefully measured. At this point a side-to-side anastomosis was created between the Roux limb and the end of the biliopancreatic limb. This was accomplished with a single firing of the 60 mm white load linear stapler. The common enterotomy was closed with a running 2-0 Vicryl begun at either end of the enterotomy and tied centrally. Vistaseal tissue sealant was placed over the anastomosis. The mesenteric defect was then closed with running 2-0 silk. The omentum was then divided with the harmonic scalpel up towards the transverse colon to allow mobility of the Roux limb toward the gastric pouch. The patient was then placed in steep reversed Trendelenburg. Through a 5 mm subxiphoid site the [facility] retractor was placed and the left lobe of the liver elevated with excellent exposure of the upper stomach and hiatus. The angle of Hiss was then mobilized with the harmonic scalpel. A 5 cm gastric pouch was then carefully measured along the lesser curve of the stomach. Dissection was carried along the lesser curve at this point with the Harmonic scalpel working carefully back toward the lesser sac at right angles to the lesser curve. The free lesser sac was then entered. After being sure all tubes were removed from the stomach an initial firing of the gold load 60  mm linear stapler was fired at right angles across the lesser curve for about 4 cm. The gastric pouch was further mobilized  posteriorly and then the pouch was completed with 2 further firings of the 60 mm and one firing of a 45 mm blue load linear stapler up through the previously dissected angle of His. It was ensured that the pouch was completely mobilized away from the gastric remnant. This created a nice tubular 4-5 cm gastric pouch. The Roux limb was then brought up in an antecolic fashion with the candycane facing to the patient's left without undue tension. The gastrojejunostomy was created with an initial posterior row of 2-0 Vicryl between the Roux limb and the staple line of the gastric pouch. Enterotomies were then made in the gastric pouch and the Roux limb with the harmonic scalpel and at approximately 2-2-1/2 cm anastomosis was created with a single firing of the 51m blue load linear stapler. The staple line was inspected and was intact without bleeding. The common enterotomy was then closed with running 2-0 Vicryl begun at either end and tied centrally. The Ewall tube was then easily passed through the anastomosis and an outer anterior layer of running 2-0 Vicryl was placed. The Ewald tube was removed. With the outlet of the gastrojejunostomy clamped and under saline irrigation the assistant performed upper endoscopy and with the gastric pouch tensely distended with air-there was no evidence of leak on this test. The pouch was desufflated. The PTerance Hartdefect was closed with running 2-0 silk. The abdomen was inspected for any evidence of bleeding or bowel injury and everything looked fine. The Nathanson retractor was removed under direct vision after coating the anastomosis with Vistaseal tissue sealant. All CO2 was evacuated and trochars removed. Skin incisions were closed with 4-0 monocryl in a subcuticular fashion followed by benzoin, steri-strips and bandages. Sponge needle and instrument counts were correct. The patient was taken to the PACU in good condition.    ELeighton Ruff WRedmond Pulling MD, FACS General, Bariatric, &  Minimally Invasive Surgery [facility] [place]

## 2018-10-28 NOTE — Progress Notes (Signed)
Discussed post op day goals with patient including ambulation, IS, diet progression, pain, and nausea control.  BSTOP education provided including BSTOP information guide, "Guide for Pain Management after your Bariatric Procedure".  Questions answered.

## 2018-10-28 NOTE — Discharge Instructions (Signed)
GASTRIC BYPASS/SLEEVE  Home Care Instructions   These instructions are to help you care for yourself when you go home.  Call: If you have any problems.  Call [phone] and ask for the surgeon on call  If you need immediate help, come to the ER at [facility].   Tell the ER staff that you are a new post-op gastric bypass or gastric sleeve patient   Signs and symptoms to report:  Severe vomiting or nausea o If you cannot keep down clear liquids for longer than 1 day, call your surgeon   Abdominal pain that does not get better after taking your pain medication  Fever over 100.4 F with chills  Heart beating over 100 beats a minute  Shortness of breath at rest  Chest pain   Redness, swelling, drainage, or foul odor at incision (surgical) sites   If your incisions open or pull apart  Swelling or pain in calf (lower leg)  Diarrhea (Loose bowel movements that happen often), frequent watery, uncontrolled bowel movements  Constipation, (no bowel movements for 3 days) if this happens: Pick one o Milk of Magnesia, 2 tablespoons by mouth, 3 times a day for 2 days if needed o Stop taking Milk of Magnesia once you have a bowel movement o Call your doctor if constipation continues Or o Miralax  (instead of Milk of Magnesia) following the label instructions o Stop taking Miralax once you have a bowel movement o Call your doctor if constipation continues  Anything you think is not normal   Normal side effects after surgery:  Unable to sleep at night or unable to focus  Irritability or moody  Being tearful (crying) or depressed These are common complaints, possibly related to your anesthesia medications that put you to sleep, stress of surgery, and change in lifestyle.  This usually goes away a few weeks after surgery.  If these feelings continue, call your primary care doctor.   Wound Care: You may have surgical glue, steri-strips, or staples over your incisions after  surgery  Surgical glue:  Looks like a clear film over your incisions and will wear off a little at a time  Steri-strips: Strips of tape over your incisions. You may notice a yellowish color on the skin under the steri-strips. This is used to make the   steri-strips stick better. Do not pull the steri-strips off - let them fall off  Staples: Staples may be removed before you leave the hospital o If you go home with staples, call [facility], [phone]) [phone] at for an appointment with your surgeons nurse to have staples removed 10 days after surgery.  Showering: You may shower two (2) days after your surgery unless your surgeon tells you differently o Wash gently around incisions with warm soapy water, rinse well, and gently pat dry  o No tub baths until staples are removed, steri-strips fall off or glue is gone.    Medications:  Medications should be liquid or crushed if larger than the size of a dime  Extended release pills (medication that release a little bit at a time through the day) should NOT be crushed or cut. (examples include XL, ER, DR, SR)  Depending on the size and number of medications you take, you may need to space (take a few throughout the day)/change the time you take your medications so that you do not over-fill your pouch (smaller stomach)  Make sure you follow-up with your primary care doctor to  make medication changes needed during rapid weight loss and life-style changes  If you have diabetes, follow up with the doctor that orders your diabetes medication(s) within one week after surgery and check your blood sugar regularly.  Do not drive while taking prescription pain medication   It is ok to take Tylenol by the bottle instructions with your pain medicine or instead of your pain medicine as needed.  DO NOT TAKE NSAIDS (EXAMPLES OF NSAIDS:  IBUPROFREN/ NAPROXEN)  Diet:                    First 2 Weeks  You will see the dietician t about two (2) weeks  after your surgery. The dietician will increase the types of foods you can eat if you are handling liquids well:  If you have severe vomiting or nausea and cannot keep down clear liquids lasting longer than 1 day, call your surgeon @ [phone]) Protein Shake  Drink at least 2 ounces of shake 5-6 times per day  Each serving of protein shakes (usually 8 - 12 ounces) should have: o 15 grams of protein  o And no more than 5 grams of carbohydrate   Goal for protein each day: o Men = 80 grams per day o Women = 60 grams per day  Protein powder may be added to fluids such as non-fat milk or Lactaid milk or unsweetened Soy/Almond milk (limit to 35 grams added protein powder per serving)  Hydration  Slowly increase the amount of water and other clear liquids as tolerated (See Acceptable Fluids)  Slowly increase the amount of protein shake as tolerated    Sip fluids slowly and throughout the day.  Do not use straws.  May use sugar substitutes in small amounts (no more than 6 - 8 packets per day; i.e. Splenda)  Fluid Goal  The first goal is to drink at least 8 ounces of protein shake/drink per day (or as directed by the nutritionist); some examples of protein shakes are [employer] Johnson, [employer]R [employer], EAS Edge HP, and Unjury. See handout from pre-op Bariatric Education Class: o Slowly increase the amount of protein shake you drink as tolerated o You may find it easier to slowly sip shakes throughout the day o It is important to get your proteins in first  Your fluid goal is to drink 64 - 100 ounces of fluid daily o It may take a few weeks to build up to this  32 oz (or more) should be clear liquids  And   32 oz (or more) should be full liquids (see below for examples)  Liquids should not contain sugar, caffeine, or carbonation  Clear Liquids:  Water or Sugar-free flavored water (i.e. Fruit H2O, Propel)  Decaffeinated coffee or tea (sugar-free)  Crystal Lite, Wylers Lite,  Minute Maid Lite  Sugar-free Jell-O  Bouillon or broth  Sugar-free Popsicle:   *Less than 20 calories each; Limit 1 per day  Full Liquids: Protein Shakes/Drinks + 2 choices per day of other full liquids  Full liquids must be: o No More Than 15 grams of Carbs per serving  o No More Than 3 grams of Fat per serving  Strained low-fat cream soup (except Cream of Potato or Tomato)  Non-Fat milk  Fat-free Lactaid Milk  Unsweetened Soy Or Unsweetened Almond Milk  Low Sugar yogurt (Dannon Lite & Fit, Greek yogurt; Oikos Triple Zero; Chobani Simply 100; Yoplait 100 calorie [place] - No Fruit on the Bottom)    Vitamins  and Minerals  Start 1 day after surgery unless otherwise directed by your surgeon  2 Chewable Bariatric Specific Multivitamin / Multimineral Supplement with iron (Example: Bariatric Advantage Multi EA)  Chewable Calcium with Vitamin D-3 (Example: 3 Chewable Calcium Plus 600 with Vitamin D-3) o Take 500 mg three (3) times a day for a total of 1500 mg each day o Do not take all 3 doses of calcium at one time as it may cause constipation, and you can only absorb 500 mg  at a time  o Do not mix multivitamins containing iron with calcium supplements; take 2 hours apart  Menstruating women and those with a history of anemia (a blood disease that causes weakness) may need extra iron o Talk with your doctor to see if you need more iron  Do not stop taking or change any vitamins or minerals until you talk to your dietitian or surgeon  Your Dietitian and/or surgeon must approve all vitamin and mineral supplements   Activity and Exercise: Limit your physical activity as instructed by your doctor.  It is important to continue walking at home.  During this time, use these guidelines:  Do not lift anything greater than ten (10) pounds for at least two (2) weeks  Do not go back to work or drive until Engineer, production says you can  You may have sex when you feel comfortable  o It is  VERY important for male patients to use a reliable birth control method; fertility often increases after surgery  o All hormonal birth control will be ineffective for 30 days after surgery due to medications given during surgery a barrier method must be used. o Do not get pregnant for at least 18 months  Start exercising as soon as your doctor tells you that you can o Make sure your doctor approves any physical activity  Start with a simple walking program  Walk 5-15 minutes each day, 7 days per week.   Slowly increase until you are walking 30-45 minutes per day Consider joining our [place] program. [phone] or email [email or web address] [email or web address]   Special Instructions Things to remember:  Use your CPAP when sleeping if this applies to you   [facility] has two free Bariatric Surgery Support Groups that meet monthly o The 3rd Thursday of each month, 6 pm, [facility]  o The 2nd Friday of each month, 11:45 am in the private dining room in the basement of [place]  It is very important to keep all follow up appointments with your surgeon, dietitian, primary care physician, and behavioral health practitioner  Routine follow up schedule with your surgeon include appointments at 2-3 weeks, 6-8 weeks, 6 months, and 1 year at a minimum.  Your surgeon may request to see you more often.   o After the first year, please follow up with your bariatric surgeon and dietitian at least once a year in order to maintain best weight loss results [facility]: [place]: [phone] Bariatric Nurse Coordinator: [phone]      Reviewed and Endorsed  by [facility] Patient Education Committee, June, 2016 Edits Approved: Aug, 2018

## 2018-10-28 NOTE — Transfer of Care (Signed)
Immediate Anesthesia Transfer of Care Note  Patient: Erik Weber  Procedure(s) Performed: LAPAROSCOPIC ROUX-EN-Y GASTRIC BYPASS WITH UPPER ENDOSCOPY (N/A Abdomen)  Patient Location: PACU  Anesthesia Type:General  Level of Consciousness: awake, alert  and oriented  Airway & Oxygen Therapy: Patient Spontanous Breathing and Patient connected to face mask oxygen  Post-op Assessment: Report given to RN and Post -op Vital signs reviewed and stable  Post vital signs: Reviewed and stable  Last Vitals:  Vitals Value Taken Time  BP 153/101 10/28/18 1015  Temp    Pulse 71 10/28/18 1015  Resp 13 10/28/18 1015  SpO2 99 % 10/28/18 1015  Vitals shown include unvalidated device data.  Last Pain:  Vitals:   10/28/18 0550  TempSrc:   PainSc: 0-No pain         Complications: No apparent anesthesia complications

## 2018-10-28 NOTE — Anesthesia Preprocedure Evaluation (Signed)
Anesthesia Evaluation  Patient identified by MRN, date of birth, ID band Patient awake    Reviewed: Allergy & Precautions, H&P , NPO status , Patient's Chart, lab work & pertinent test results  Airway Mallampati: II   Neck ROM: full    Dental   Pulmonary asthma , sleep apnea ,    breath sounds clear to auscultation       Cardiovascular hypertension,  Rhythm:regular Rate:Normal     Neuro/Psych    GI/Hepatic GERD  ,  Endo/Other  Morbid obesity  Renal/GU Renal InsufficiencyRenal disease     Musculoskeletal   Abdominal   Peds  Hematology   Anesthesia Other Findings   Reproductive/Obstetrics                             Anesthesia Physical Anesthesia Plan  ASA: III  Anesthesia Plan: General   Post-op Pain Management:    Induction: Intravenous  PONV Risk Score and Plan: 2 and Ondansetron, Dexamethasone and Midazolam  Airway Management Planned: Oral ETT  Additional Equipment:   Intra-op Plan:   Post-operative Plan: Extubation in OR  Informed Consent: I have reviewed the patients History and Physical, chart, labs and discussed the procedure including the risks, benefits and alternatives for the proposed anesthesia with the patient or authorized representative who has indicated his/her understanding and acceptance.       Plan Discussed with: CRNA, Anesthesiologist and Surgeon  Anesthesia Plan Comments:         Anesthesia Quick Evaluation

## 2018-10-29 ENCOUNTER — Encounter (HOSPITAL_COMMUNITY): Payer: Self-pay | Admitting: General Surgery

## 2018-10-29 LAB — COMPREHENSIVE METABOLIC PANEL
ALT: 50 U/L — ABNORMAL HIGH (ref 0–44)
AST: 34 U/L (ref 15–41)
Albumin: 3.8 g/dL (ref 3.5–5.0)
Alkaline Phosphatase: 55 U/L (ref 38–126)
Anion gap: 11 (ref 5–15)
BUN: 14 mg/dL (ref 6–20)
CO2: 23 mmol/L (ref 22–32)
Calcium: 8.9 mg/dL (ref 8.9–10.3)
Chloride: 103 mmol/L (ref 98–111)
Creatinine, Ser: 1.41 mg/dL — ABNORMAL HIGH (ref 0.61–1.24)
GFR calc Af Amer: >60 mL/min (ref >60)
GFR calc non Af Amer: >60 mL/min (ref >60)
Glucose, Bld: 125 mg/dL — ABNORMAL HIGH (ref 70–99)
Potassium: 4.3 mmol/L (ref 3.5–5.1)
Sodium: 137 mmol/L (ref 135–145)
Total Bilirubin: 0.6 mg/dL (ref 0.3–1.2)
Total Protein: 7.2 g/dL (ref 6.5–8.1)

## 2018-10-29 LAB — CBC WITH DIFFERENTIAL/PLATELET
Abs Immature Granulocytes: 0.06 10*3/uL (ref 0.00–0.07)
Basophils Absolute: 0 10*3/uL (ref 0.0–0.1)
Basophils Relative: 0 %
Eosinophils Absolute: 0 10*3/uL (ref 0.0–0.5)
Eosinophils Relative: 0 %
HCT: 42.8 % (ref 39.0–52.0)
Hemoglobin: 13.7 g/dL (ref 13.0–17.0)
Immature Granulocytes: 0 %
Lymphocytes Relative: 17 %
Lymphs Abs: 2.4 10*3/uL (ref 0.7–4.0)
MCH: 26.8 pg (ref 26.0–34.0)
MCHC: 32 g/dL (ref 30.0–36.0)
MCV: 83.8 fL (ref 80.0–100.0)
Monocytes Absolute: 1.2 10*3/uL — ABNORMAL HIGH (ref 0.1–1.0)
Monocytes Relative: 9 %
Neutro Abs: 10 10*3/uL — ABNORMAL HIGH (ref 1.7–7.7)
Neutrophils Relative %: 74 %
Platelets: 296 10*3/uL (ref 150–400)
RBC: 5.11 MIL/uL (ref 4.22–5.81)
RDW: 13.1 % (ref 11.5–15.5)
WBC: 13.6 10*3/uL — ABNORMAL HIGH (ref 4.0–10.5)
nRBC: 0 % (ref 0.0–0.2)

## 2018-10-29 MED ORDER — ONDANSETRON 4 MG PO TBDP: 4.0000 mg | ORAL_TABLET | Freq: Four times a day (QID) | ORAL | 0 refills | Status: DC | PRN

## 2018-10-29 MED ORDER — TRAMADOL HCL 50 MG PO TABS: 50.0000 mg | ORAL_TABLET | Freq: Four times a day (QID) | ORAL | 0 refills | Status: DC | PRN

## 2018-10-29 MED ORDER — ACETAMINOPHEN 500 MG PO TABS: 1000.0000 mg | ORAL_TABLET | Freq: Three times a day (TID) | ORAL | 0 refills | Status: AC

## 2018-10-29 MED ORDER — GABAPENTIN 100 MG PO CAPS: 200.0000 mg | ORAL_CAPSULE | Freq: Two times a day (BID) | ORAL | 0 refills | Status: DC

## 2018-10-29 NOTE — Progress Notes (Signed)
Patient alert and oriented, pain is controlled. Patient is tolerating fluids, advanced to protein shake today, patient is tolerating well. Reviewed Gastric Bypass discharge instructions with patient and patient is able to articulate understanding. Provided information on BELT program, Support Group and [facility] outpatient pharmacy. All questions answered, will continue to monitor.   Total fluid intake 845 Per dehydration protocol call back one week post op

## 2018-10-29 NOTE — Progress Notes (Signed)
Patient alert and oriented, Post op day 1.  Provided support and encouragement.  Encouraged pulmonary toilet, ambulation and small sips of liquids.  Completed 12 ounces of bari clear fluid and 2 ounce of protein. All questions answered.  Will continue to monitor.

## 2018-10-29 NOTE — Progress Notes (Signed)
Nutrition Brief Note  RD consulted for diet education for patient s/p bariatric surgery. While the RDs are working remotely, Insurance risk surveyor providing education.   If nutrition issues arise, please consult RD.   Clayton Bibles, MS, RD, LDN Inpatient Clinical Dietitian Pager: [phone] After Hours Pager: [phone]

## 2018-10-29 NOTE — Discharge Summary (Signed)
Physician Discharge Summary  Erik Weber VXB:[id number] DOB: [birth year] DOA: 10/28/2018  PCP: Darreld Mclean, MD  Admit date: 10/28/2018 Discharge date: 10/29/2018  Recommendations for Outpatient Follow-up:    Follow-up Information    Greer Pickerel, MD. Go on 11/20/2018.   Specialty: General Surgery Why: at 1115 am.  Please arrive 15 minutes early for appointment time. Contact information: [address] [place] [address] [phone]        Carlena Hurl, PA-C. Go on 12/13/2018.   Specialty: General Surgery Why: at 9 am Contact information: [place] [address] [phone]          Discharge Diagnoses:  Active Problems:   Essential hypertension   GERD   Disorder resulting from impaired renal function   Hypertriglyceridemia   Obesity   OSA (obstructive sleep apnea)   S/P gastric bypass   Surgical Procedure: Laparoscopic Roux-en-Y gastric bypass, upper endoscopy  Discharge Condition: Good Disposition: Home  Diet recommendation: Postoperative gastric bypass diet  Filed Weights   10/28/18 0529  Weight: 124.7 kg     Hospital Course:  The patient was admitted for a planned laparoscopic Roux-en-Y gastric bypass. Please see operative note. Preoperatively the patient was given 5000 units of subcutaneous heparin for DVT prophylaxis. ERAS protocol was used. Postoperative prophylactic Lovenox dosing was started on the evening of postoperative day 0.  The patient was started on ice chips and water on the evening of POD 0 which they tolerated. On postoperative day 1 The patient's diet was advanced to protein shakes which they also tolerated. On POD 1, The patient was ambulating without difficulty. Their vital signs are stable without fever or tachycardia. Their hemoglobin had remained stable.  The patient had received discharge instructions and counseling. They were deemed stable for discharge.  BP (!) 141/87 (BP Location: Left  Arm)   Pulse 86   Temp 99.1 F (37.3 C) (Oral)   Resp 18   Ht 5' 11"  (1.803 m)   Wt 124.7 kg   SpO2 99%   BMI 38.35 kg/m   Gen: alert, NAD, non-toxic appearing Pupils: equal, no scleral icterus Pulm: Lungs clear to auscultation, symmetric chest rise CV: regular rate and rhythm Abd: soft, min tender, nondistended. No cellulitis. No incisional hernia Ext: no edema, no calf tenderness Skin: no rash, no jaundice  Discharge Instructions  Discharge Instructions    Ambulate hourly while awake   Complete by: As directed    Call MD for:  difficulty breathing, headache or visual disturbances   Complete by: As directed    Call MD for:  persistant dizziness or light-headedness   Complete by: As directed    Call MD for:  persistant nausea and vomiting   Complete by: As directed    Call MD for:  redness, tenderness, or signs of infection (pain, swelling, redness, odor or green/yellow discharge around incision site)   Complete by: As directed    Call MD for:  severe uncontrolled pain   Complete by: As directed    Call MD for:  temperature >101 F   Complete by: As directed    Diet bariatric full liquid   Complete by: As directed    Discharge instructions   Complete by: As directed    See bariatric discharge instructions   Incentive spirometry   Complete by: As directed    Perform hourly while awake     Allergies as of 10/29/2018   No Known Allergies  Medication List    STOP taking these medications   doxycycline 100 MG capsule Commonly known as: VIBRAMYCIN     TAKE these medications   acetaminophen 500 MG tablet Commonly known as: TYLENOL Take 2 tablets (1,000 mg total) by mouth every 8 (eight) hours for 5 days.   albuterol 108 (90 Base) MCG/ACT inhaler Commonly known as: VENTOLIN HFA Inhale 2 puffs into the lungs every 6 (six) hours as needed for wheezing or shortness of breath.   amLODipine 5 MG tablet Commonly known as: NORVASC Take 1 tablet (5 mg total) by  mouth daily. Notes to patient: Monitor Blood Pressure Daily and keep a log for primary care physician.  You may need to make changes to your medications with rapid weight loss.     gabapentin 100 MG capsule Commonly known as: NEURONTIN Take 2 capsules (200 mg total) by mouth every 12 (twelve) hours.   olmesartan 20 MG tablet Commonly known as: BENICAR Take 1 tablet (20 mg total) by mouth daily. Notes to patient: Monitor Blood Pressure Daily and keep a log for primary care physician.  You may need to make changes to your medications with rapid weight loss.     omeprazole 40 MG capsule Commonly known as: PRILOSEC TAKE 1 CAPSULE BY MOUTH EVERY DAY What changed: how much to take   ondansetron 4 MG disintegrating tablet Commonly known as: ZOFRAN-ODT Take 1 tablet (4 mg total) by mouth every 6 (six) hours as needed for nausea or vomiting.   traMADol 50 MG tablet Commonly known as: ULTRAM Take 1 tablet (50 mg total) by mouth every 6 (six) hours as needed (pain).      Follow-up Information    Greer Pickerel, MD. Go on 11/20/2018.   Specialty: General Surgery Why: at 1115 am.  Please arrive 15 minutes early for appointment time. Contact information: [address] [place] [address] [phone]        Carlena Hurl, PA-C. Go on 12/13/2018.   Specialty: General Surgery Why: at 9 am Contact information: [place] [address] [phone]            The results of significant diagnostics from this hospitalization (including imaging, microbiology, ancillary and laboratory) are listed below for reference.    Significant Diagnostic Studies: No results found.  Labs: Basic Metabolic Panel: Recent Labs  Lab 10/24/18 0839 10/29/18 0333  NA 138 137  K 4.2 4.3  CL 103 103  CO2 26 23  GLUCOSE 96 125*  BUN 13 14  CREATININE 1.52* 1.41*  CALCIUM 9.3 8.9   Liver Function Tests: Recent Labs  Lab 10/24/18 0839 10/29/18 0333  AST 21  34  ALT 27 50*  ALKPHOS 73 55  BILITOT 0.6 0.6  PROT 7.5 7.2  ALBUMIN 4.4 3.8    CBC: Recent Labs  Lab 10/24/18 0839 10/28/18 1042 10/29/18 0549  WBC 6.9  --  13.6*  NEUTROABS 3.1  --  10.0*  HGB 14.5 14.3 13.7  HCT 45.9 44.7 42.8  MCV 85.3  --  83.8  PLT 267  --  296    CBG: No results for input(s): GLUCAP in the last 168 hours.  Active Problems:   Essential hypertension   GERD   Disorder resulting from impaired renal function   Hypertriglyceridemia   Obesity   OSA (obstructive sleep apnea)   S/P gastric bypass   Time coordinating discharge: 15 min  Signed:  Gayland Curry, MD FACS  [facility], [place] [id number] 10/29/2018, 11:15 AM

## 2018-10-30 NOTE — Anesthesia Postprocedure Evaluation (Signed)
Anesthesia Post Note  Patient: Erik Weber  Procedure(s) Performed: LAPAROSCOPIC ROUX-EN-Y GASTRIC BYPASS WITH UPPER ENDOSCOPY (N/A Abdomen)     Patient location during evaluation: PACU Anesthesia Type: General Level of consciousness: awake and alert Pain management: pain level controlled Vital Signs Assessment: post-procedure vital signs reviewed and stable Respiratory status: spontaneous breathing, nonlabored ventilation, respiratory function stable and patient connected to nasal cannula oxygen Cardiovascular status: blood pressure returned to baseline and stable Postop Assessment: no apparent nausea or vomiting Anesthetic complications: no    Last Vitals:  Vitals:   10/29/18 0549 10/29/18 0940  BP: 134/89 (!) 141/87  Pulse: 71 86  Resp: 18 18  Temp: 36.8 C 37.3 C  SpO2: 95% 99%    Last Pain:  Vitals:   10/29/18 0940  TempSrc: Oral  PainSc:                  Tucker S

## 2018-11-04 ENCOUNTER — Telehealth (HOSPITAL_COMMUNITY): Payer: Self-pay

## 2018-11-04 NOTE — Patient Instructions (Signed)
It was great to see you again today-congratulations on your recent surgery! You seem to be doing great.  Let me know if you have any concerns Let's have you go back on 2.5 mg of amlodipine only.  Let me know how your BP responds to this- we can adjust as needed

## 2018-11-04 NOTE — Progress Notes (Signed)
[place] at [facility] [address] [place], [place] [address] [phone]  Date:  11/06/2018   Name:  Erik Weber   DOB:  05-09-1975   MRN:  [id number]  PCP:  Darreld Mclean, MD    Chief Complaint: Hospitalization Follow-up   History of Present Illness:  Erik Weber is a 43 y.o. very pleasant male patient who presents with the following:  Here today for hospital follow-up visit He underwent gastric bypass recently-was admitted overnight September 21 until September 22 Hemoglobin was 13.7 on day of discharge Flu shot-we will give today   Discharge Diagnoses:  Active Problems:   Essential hypertension   GERD   Disorder resulting from impaired renal function   Hypertriglyceridemia   Obesity   OSA (obstructive sleep apnea)   S/P gastric bypass  Surgical Procedure: Laparoscopic Roux-en-Y gastric bypass, upper endoscopy Diet recommendation: Postoperative gastric bypass diet     Filed Weights   10/28/18 0529  Weight: 124.7 kg   Hospital Course:  The patient was admitted for a planned laparoscopic Roux-en-Y gastric bypass. Please see operative note. Preoperatively the patient was given 5000 units of subcutaneous heparin for DVT prophylaxis. ERAS protocol was used. Postoperative prophylactic Lovenox dosing was started on the evening of postoperative day 0.  The patient was started on ice chips and water on the evening of POD 0 which they tolerated. On postoperative day 1 The patient's diet was advanced to protein shakes which they also tolerated. On POD 1, The patient was ambulating without difficulty. Their vital signs are stable without fever or tachycardia. Their hemoglobin had remained stable.  The patient had received discharge instructions and counseling. They were deemed stable for discharge.  He is following his restricted diet carefully as directed He has some tenderness from the operation but no  vomiting He is passing stools He will see his surgeon next month Dietician next week  Overall he feels great, no concerns except for his blood pressure.  He had been taking both amlodipine and olmesartan, but had noted some low blood pressure readings since he got home from the hospital He stopped taking his blood pressure medications 3 or 4 days ago  We discussed the fact that his blood pressure may fluctuate.  Was likely artificially suppressed due to hospitalization, may start to come up now he is more active.  However as he loses weight his blood pressure will also come down.  It will be necessary to monitor his blood pressure closely, and adjust medications as needed   Wt Readings from Last 3 Encounters:  11/06/18 261 lb (118.4 kg)  10/28/18 275 lb (124.7 kg)  10/24/18 279 lb 2 oz (126.6 kg)    Patient Active Problem List   Diagnosis Date Noted  . OSA (obstructive sleep apnea) 10/28/2018  . 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 Obesity 08/18/2013  . Tinea versicolor 05/19/2013  . Hypertriglyceridemia 05/28/2012  . Hypogonadism male 06/07/2011  . Essential hypertension 09/21/2009  . 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 GERD 04/04/2007    Past Medical History:  Diagnosis Date  . Childhood asthma   . Chronic renal insufficiency   . Fracture of base of fifth metacarpal bone of right hand   . GERD (gastroesophageal reflux disease)   . Hypertension   . Morbid obesity ([place])   . Pneumonitis 10/04/2018   after accidental inhalation of swimming pool water during swimmng lesson  .  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 Sleep apnea    Mild, no cpap needed    Past Surgical History:  Procedure Laterality Date  . ADENOIDECTOMY     childhood  . 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 HAND SURGERY Right  02/2016  . UPPER GI ENDOSCOPY  03/10/2016    Social History   Tobacco Use  . Smoking status: Never Smoker  . Smokeless tobacco: Never Used  Substance Use Topics  . Alcohol use: No    Alcohol/week: 0.0 standard drinks  . Drug use: No    Family History  Problem Relation Age of Onset  . Kidney cancer Mother 26  . Hypertension Father        age 3  . Diabetes Father   . Lung cancer Paternal Uncle   . Colon cancer Neg Hx   . Prostate cancer Neg Hx   . Liver disease Neg Hx   . Esophageal cancer Neg Hx   . Stomach cancer Neg Hx   . Pancreatic cancer Neg Hx     No Known Allergies  Medication list has been reviewed and updated.  Current Outpatient Medications on File Prior to Visit  Medication Sig Dispense Refill  . albuterol (VENTOLIN HFA) 108 (90 Base) MCG/ACT inhaler Inhale 2 puffs into the lungs every 6 (six) hours as needed for wheezing or shortness of breath. 18 g 1  . amLODipine (NORVASC) 5 MG tablet Take 1 tablet (5 mg total) by mouth daily. 90 tablet 3  . gabapentin (NEURONTIN) 100 MG capsule Take 2 capsules (200 mg total) by mouth every 12 (twelve) hours. 20 capsule 0  . olmesartan (BENICAR) 20 MG tablet Take 1 tablet (20 mg total) by mouth daily. 90 tablet 3  . omeprazole (PRILOSEC) 40 MG capsule TAKE 1 CAPSULE BY MOUTH EVERY DAY (Patient taking differently: Take 40 mg by mouth daily. ) 90 capsule 1   No current facility-administered medications on file prior to visit.     Review of Systems:  As per HPI- otherwise negative.   Physical Examination: Vitals:   11/06/18 1604  BP: 132/90  Pulse: 78  Resp: 16  Temp: 97.8 F (36.6 C)  SpO2: 98%   Vitals:   11/06/18 1604  Weight: 261 lb (118.4 kg)  Height: 5' 11"  (1.803 m)   Body mass index is 36.4 kg/m. Ideal Body Weight: Weight in (lb) to have BMI = 25: 178.9  GEN: WDWN, NAD, Non-toxic, A & O x 3, obese, looks well HEENT: Atraumatic, Normocephalic. Neck supple. No masses, No LAD. Ears and Nose: No  external deformity. CV: RRR, No M/G/R. No JVD. No thrill. No extra heart sounds. PULM: CTA B, no wheezes, crackles, rhonchi. No retractions. No resp. distress. No accessory muscle use. ABD: S, NT, ND, +BS. No rebound. No HSM.  Laparoscopic incisions appear to be healing normally.  Minimal tenderness at incisions only EXTR: No c/c/e NEURO Normal gait.  PSYCH: Normally interactive. Conversant. Not depressed or anxious appearing.  Calm demeanor.    Assessment and Plan: Hospital discharge follow-up  Hypertriglyceridemia  Essential hypertension  Following up from recent hospitalization.  Child is doing very well following his gastric bypass surgery.  He has follow-up already planned with dietitian and with the surgeon.  Flu shot given today  Blood pressure is borderline high today.  We will have him go back on 2.5 mg amlodipine, gave him blood pressure parameters.  He will monitor closely and keep me posted.  We can adjust his medications as needed    Signed Lamar Blinks, MD

## 2018-11-04 NOTE — Telephone Encounter (Signed)
Patient called to discuss post bariatric surgery follow up questions.  See below:   1.  Tell me about your pain and pain management?denies, sore in one spot on left side  2.  Let's talk about fluid intake.  How much total fluid are you taking in?50 ounces  3.  How much protein have you taken in the last 2 days?45-60 grams of protein  4.  Have you had nausea?  Tell me about when have experienced nausea and what you did to help?not really nauseated just not wanted to shake,  5.  Has the frequency or color changed with your urine?denies headache, dizziness making urine  6.  Tell me what your incisions look like?no problerms  7.  Have you been passing gas? BM?had bm passing gas  8.  If a problem or question were to arise who would you call?  Do you know contact numbers for [facility], [facility], and [facility]?aware of how to contact all services  9.  How has the walking going?walking regularly without difficulty  10.  How are your vitamins and calcium going?  How are you taking them?mvi started calcium 3 x per day

## 2018-11-06 ENCOUNTER — Other Ambulatory Visit: Payer: Self-pay

## 2018-11-06 ENCOUNTER — Ambulatory Visit (INDEPENDENT_AMBULATORY_CARE_PROVIDER_SITE_OTHER): Payer: BC Managed Care – PPO | Admitting: Family Medicine

## 2018-11-06 ENCOUNTER — Encounter: Payer: Self-pay | Admitting: Family Medicine

## 2018-11-06 VITALS — BP 132/90 | HR 78 | Temp 97.8°F | Resp 16 | Ht 71.0 in | Wt 261.0 lb

## 2018-11-06 DIAGNOSIS — Z23 Encounter for immunization: Secondary | ICD-10-CM | POA: Diagnosis not present

## 2018-11-06 DIAGNOSIS — E781 Pure hyperglyceridemia: Secondary | ICD-10-CM | POA: Diagnosis not present

## 2018-11-06 DIAGNOSIS — I10 Essential (primary) hypertension: Secondary | ICD-10-CM

## 2018-11-06 DIAGNOSIS — Z09 Encounter for follow-up examination after completed treatment for conditions other than malignant neoplasm: Secondary | ICD-10-CM

## 2018-11-12 ENCOUNTER — Encounter: Payer: Self-pay | Admitting: Dietician

## 2018-11-12 ENCOUNTER — Encounter: Payer: BC Managed Care – PPO | Attending: General Surgery | Admitting: Dietician

## 2018-11-12 ENCOUNTER — Other Ambulatory Visit: Payer: Self-pay

## 2018-11-12 DIAGNOSIS — E669 Obesity, unspecified: Secondary | ICD-10-CM | POA: Diagnosis not present

## 2018-11-12 NOTE — Progress Notes (Addendum)
[place] at [facility] [address], [place], [place] [address] [phone]  Date:  11/14/2018   Name:  Erik Weber   DOB:  [birth year]   MRN:  [id number]  PCP:  Darreld Mclean, MD    Chief Complaint: Annual Exam   History of Present Illness:  Erik Weber is a 43 y.o. very pleasant male patient who presents with the following:  Pt with history of OSA, obesity, HTN, hypogonadism, GERD Here today for a CPE Last seen by myself just a week or so ago following hospital discharge from gastric bypass-he states he had this procedure mostly to treat severe GERD, with weight loss as an added benefit. His GERD is much better alread He is losing weight quickly- he is on soft solids now but is having a hard time getting enough protein He has lost 10 lbs since I saw him on 9/30 He is using some protein shakes to get protein in He really cannot eat meat yet - he can eat eggs however  Wt Readings from Last 3 Encounters:  11/14/18 250 lb (113.4 kg)  11/12/18 252 lb 12.8 oz (114.7 kg)  11/06/18 261 lb (118.4 kg)   He is feeling tired-he thinks due to not eating enough He is not able to take in a lot of calories  He is taking 2.5 mg of amlodipine right now - that is all, not taking olmesartan if his blood pressure has been on the low side His home pressure may run 115/80, 136/90  Will see his surgeon in about 10 days for follow-up and continues to see his nutrition team  No chest pain or shortness of breath He has not gone back to the gym yet due to COVID-19   Patient Active Problem List   Diagnosis Date Noted  . OSA (obstructive sleep apnea) 10/28/2018  . 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 Obesity 08/18/2013  . Tinea versicolor 05/19/2013  . Hypertriglyceridemia 05/28/2012  . Hypogonadism male 06/07/2011  . Essential hypertension 09/21/2009  . 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 GERD 04/04/2007     Past Medical History:  Diagnosis Date  . Childhood asthma   . Chronic renal insufficiency   . Fracture of base of fifth metacarpal bone of right hand   . GERD (gastroesophageal reflux disease)   . Hypertension   . Morbid obesity ([place])   . Pneumonitis 10/04/2018   after accidental inhalation of swimming pool water during swimmng lesson  . 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 Sleep apnea    Mild, no cpap needed    Past Surgical History:  Procedure Laterality Date  . ADENOIDECTOMY     childhood  . 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 HAND SURGERY Right 02/2016  . UPPER GI ENDOSCOPY  03/10/2016    Social History   Tobacco Use  . Smoking status: Never Smoker  . Smokeless tobacco: Never Used  Substance Use Topics  . Alcohol use: No    Alcohol/week: 0.0 standard drinks  . Drug use: No    Family History  Problem Relation Age of Onset  . Kidney cancer Mother 33  . Hypertension Father        age 83  . Diabetes Father   .  Lung cancer Paternal Uncle   . Colon cancer Neg Hx   . Prostate cancer Neg Hx   . Liver disease Neg Hx   . Esophageal cancer Neg Hx   . Stomach cancer Neg Hx   . Pancreatic cancer Neg Hx     No Known Allergies  Medication list has been reviewed and updated.  Current Outpatient Medications on File Prior to Visit  Medication Sig Dispense Refill  . albuterol (VENTOLIN HFA) 108 (90 Base) MCG/ACT inhaler Inhale 2 puffs into the lungs every 6 (six) hours as needed for wheezing or shortness of breath. 18 g 1  . amLODipine (NORVASC) 5 MG tablet Take 1 tablet (5 mg total) by mouth daily. 90 tablet 3  . gabapentin (NEURONTIN) 100 MG capsule Take 2 capsules (200 mg total) by mouth every 12 (twelve) hours. 20 capsule 0  . olmesartan (BENICAR) 20 MG tablet Take 1 tablet (20 mg total) by mouth daily.  90 tablet 3  . omeprazole (PRILOSEC) 40 MG capsule TAKE 1 CAPSULE BY MOUTH EVERY DAY (Patient taking differently: Take 40 mg by mouth daily. ) 90 capsule 1   No current facility-administered medications on file prior to visit.     Review of Systems:  As per HPI- otherwise negative. No fever chills No rash No urinary symptoms, no testicular abnormality  Physical Examination: Vitals:   11/14/18 1526  BP: 126/78  Pulse: 78  Resp: 16  Temp: 97.7 F (36.5 C)  SpO2: 97%   Vitals:   11/14/18 1526  Weight: 250 lb (113.4 kg)  Height: 5' 11"  (1.803 m)   Body mass index is 34.87 kg/m. Ideal Body Weight: Weight in (lb) to have BMI = 25: 178.9  GEN: WDWN, NAD, Non-toxic, A & O x 3, obese but has lost, looks well HEENT: Atraumatic, Normocephalic. Neck supple. No masses, No LAD. Ears and Nose: No external deformity. CV: RRR, No M/G/R. No JVD. No thrill. No extra heart sounds. PULM: CTA B, no wheezes, crackles, rhonchi. No retractions. No resp. distress. No accessory muscle use. ABD: S, NT, ND, +BS. No rebound. No HSM.  Well-healing laparoscopic incisions from recent operation EXTR: No c/c/e NEURO Normal gait.  PSYCH: Normally interactive. Conversant. Not depressed or anxious appearing.  Calm demeanor.    Assessment and Plan: Physical exam  Essential hypertension - Plan: CBC, Comprehensive metabolic panel  Hypertriglyceridemia - Plan: Lipid panel  S/P gastric bypass  Screening for diabetes mellitus - Plan: Hemoglobin A1c  Here today for complete physical. Labs pending as above Discussed his concerns about diet, he continues to follow-up with a nutritionist trained in post gastric bypass care We will continue to carefully monitor his blood pressure and adjust his medications as needed.  His blood pressure may be unpredictable due to weight loss and eating less Will plan further follow- up pending labs.   Signed Lamar Blinks, MD  Received his labs 10/12- message to  pt  Results for orders placed or performed in visit on 11/14/18  CBC  Result Value Ref Range   WBC 9.4 4.0 - 10.5 K/uL   RBC 4.84 4.22 - 5.81 Mil/uL   Platelets 465.0 (H) 150.0 - 400.0 K/uL   Hemoglobin 13.2 13.0 - 17.0 g/dL   HCT 40.4 39.0 - 52.0 %   MCV 83.6 78.0 - 100.0 fl   MCHC 32.7 30.0 - 36.0 g/dL   RDW 14.5 11.5 - 15.5 %  Comprehensive metabolic panel  Result Value Ref Range   Sodium 136 135 -  145 mEq/L   Potassium 4.4 3.5 - 5.1 mEq/L   Chloride 100 96 - 112 mEq/L   CO2 28 19 - 32 mEq/L   Glucose, Bld 81 70 - 99 mg/dL   BUN 14 6 - 23 mg/dL   Creatinine, Ser 1.67 (H) 0.40 - 1.50 mg/dL   Total Bilirubin 0.7 0.2 - 1.2 mg/dL   Alkaline Phosphatase 78 39 - 117 U/L   AST 24 0 - 37 U/L   ALT 35 0 - 53 U/L   Total Protein 7.9 6.0 - 8.3 g/dL   Albumin 4.5 3.5 - 5.2 g/dL   Calcium 9.9 8.4 - 10.5 mg/dL   GFR 54.32 (L) >60.00 mL/min  Hemoglobin A1c  Result Value Ref Range   Hgb A1c MFr Bld 4.7 4.6 - 6.5 %  Lipid panel  Result Value Ref Range   Cholesterol 155 0 - 200 mg/dL   Triglycerides 123.0 0.0 - 149.0 mg/dL   HDL 28.50 (L) >39.00 mg/dL   VLDL 24.6 0.0 - 40.0 mg/dL   LDL Cholesterol 101 (H) 0 - 99 mg/dL   Total CHOL/HDL Ratio 5    NonHDL 126.03

## 2018-11-12 NOTE — Progress Notes (Signed)
Bariatric Nutrition Education  Start Time: 3:35pm   End Time: 3:55pm  2 Week Post-Operative Nutrition Education  Patient was seen on 11/12/2018 for 1-on-1 post-operative nutrition education at [facility]   Surgery date: 10/28/2018 Surgery type: RYGB  Start weight at [facility]: 267.4 (date: 04/10/2018) Weight today: 252.8 lbs Weight change: -14.6 lbs   Body Composition Scale 11/12/2018  Weight (lbs) 252.8  BMI 35.1  Total Body Fat % 30.1     Visceral Fat 20  Fat-Free Mass % 69.8     Total Body Water % 50.8     Muscle-Mass (lbs) 47.6  Body Fat Displacement ---         Torso  (lbs) 47.2         Left Leg  (lbs) 9.4         Right Leg  (lbs) 9.4         Left Arm  (lbs) 4.7         Right Arm  (lbs) 4.7   Fluids include: water, Premier Protein shake, yogurt, broth, cream soup Dislikes Opurity MVI, so provided samples of Bariatric Fusion and Bariatric Advantage MVI.   The following the learning objectives were met by the patient during this course:  Identifies Phase 3 (Soft, High Protein Foods) Dietary Goals and will begin from 2 weeks post-operatively to 2 months post-operatively  Identifies appropriate sources of fluids and proteins   States protein recommendations and appropriate sources post-operatively  Identifies the need for appropriate texture modifications, mastication, and bite sizes when consuming solids  Identifies appropriate multivitamin and calcium sources post-operatively  Describes the need for physical activity post-operatively and will follow MD recommendations  States when to call healthcare provider regarding medication questions or post-operative complications  Handouts given include:  Phase 3: Soft, High Protein Diet  Phase 3 Meal Ideas  Follow-Up Plan: Patient will follow-up at [facility] in 6 weeks for 2 month post-op nutrition visit for diet advancement per MD.

## 2018-11-13 ENCOUNTER — Telehealth: Payer: Self-pay | Admitting: Dietician

## 2018-11-13 ENCOUNTER — Encounter: Payer: Self-pay | Admitting: Family Medicine

## 2018-11-13 ENCOUNTER — Other Ambulatory Visit: Payer: Self-pay

## 2018-11-13 NOTE — Telephone Encounter (Signed)
Pt called with questions regarding starting solid foods yesterday. We discussed this and his questions were answered.    Nat Christen Antimony) Kanai Hilger, MS, RD, LDN

## 2018-11-14 ENCOUNTER — Other Ambulatory Visit: Payer: Self-pay

## 2018-11-14 ENCOUNTER — Ambulatory Visit (INDEPENDENT_AMBULATORY_CARE_PROVIDER_SITE_OTHER): Payer: BC Managed Care – PPO | Admitting: Family Medicine

## 2018-11-14 ENCOUNTER — Encounter: Payer: Self-pay | Admitting: Family Medicine

## 2018-11-14 VITALS — BP 126/78 | HR 78 | Temp 97.7°F | Resp 16 | Ht 71.0 in | Wt 250.0 lb

## 2018-11-14 DIAGNOSIS — Z131 Encounter for screening for diabetes mellitus: Secondary | ICD-10-CM

## 2018-11-14 DIAGNOSIS — Z0000 Encounter for general adult medical examination without abnormal findings: Secondary | ICD-10-CM | POA: Diagnosis not present

## 2018-11-14 DIAGNOSIS — I10 Essential (primary) hypertension: Secondary | ICD-10-CM | POA: Diagnosis not present

## 2018-11-14 DIAGNOSIS — Z9884 Bariatric surgery status: Secondary | ICD-10-CM

## 2018-11-14 DIAGNOSIS — E781 Pure hyperglyceridemia: Secondary | ICD-10-CM

## 2018-11-14 NOTE — Patient Instructions (Signed)
Good to see you again today Please continue to monitor your BP- goal 115- 135/75-90  We can adjust your medication as needed  I will be in touch with your labs    Health Maintenance, Male Adopting a healthy lifestyle and getting preventive care are important in promoting health and wellness. Ask your health care provider about:  The right schedule for you to have regular tests and exams.  Things you can do on your own to prevent diseases and keep yourself healthy. What should I know about diet, weight, and exercise? Eat a healthy diet   Eat a diet that includes plenty of vegetables, fruits, low-fat dairy products, and lean protein.  Do not eat a lot of foods that are high in solid fats, added sugars, or sodium. Maintain a healthy weight Body mass index (BMI) is a measurement that can be used to identify possible weight problems. It estimates body fat based on height and weight. Your health care provider can help determine your BMI and help you achieve or maintain a healthy weight. Get regular exercise Get regular exercise. This is one of the most important things you can do for your health. Most adults should:  Exercise for at least 150 minutes each week. The exercise should increase your heart rate and make you sweat (moderate-intensity exercise).  Do strengthening exercises at least twice a week. This is in addition to the moderate-intensity exercise.  Spend less time sitting. Even light physical activity can be beneficial. Watch cholesterol and blood lipids Have your blood tested for lipids and cholesterol at 44 years of age, then have this test every 5 years. You may need to have your cholesterol levels checked more often if:  Your lipid or cholesterol levels are high.  You are older than 44 years of age.  You are at high risk for heart disease. What should I know about cancer screening? Many types of cancers can be detected early and may often be prevented. Depending on  your health history and family history, you may need to have cancer screening at various ages. This may include screening for:  Colorectal cancer.  Prostate cancer.  Skin cancer.  Lung cancer. What should I know about heart disease, diabetes, and high blood pressure? Blood pressure and heart disease  High blood pressure causes heart disease and increases the risk of stroke. This is more likely to develop in people who have high blood pressure readings, are of African descent, or are overweight.  Talk with your health care provider about your target blood pressure readings.  Have your blood pressure checked: ? Every 3-5 years if you are 18-52 years of age. ? Every year if you are 44 years old or older.  If you are between the ages of 36 and 75 and are a current or former smoker, ask your health care provider if you should have a one-time screening for abdominal aortic aneurysm (AAA). Diabetes Have regular diabetes screenings. This checks your fasting blood sugar level. Have the screening done:  Once every three years after age 13 if you are at a normal weight and have a low risk for diabetes.  More often and at a younger age if you are overweight or have a high risk for diabetes. What should I know about preventing infection? Hepatitis B If you have a higher risk for hepatitis B, you should be screened for this virus. Talk with your health care provider to find out if you are at risk for hepatitis B  infection. Hepatitis C Blood testing is recommended for:  Everyone born from [birth year] through 1966.  Anyone with known risk factors for hepatitis C. Sexually transmitted infections (STIs)  You should be screened each year for STIs, including gonorrhea and chlamydia, if: ? You are sexually active and are younger than 44 years of age. ? You are older than 44 years of age and your health care provider tells you that you are at risk for this type of infection. ? Your sexual activity has  changed since you were last screened, and you are at increased risk for chlamydia or gonorrhea. Ask your health care provider if you are at risk.  Ask your health care provider about whether you are at high risk for HIV. Your health care provider may recommend a prescription medicine to help prevent HIV infection. If you choose to take medicine to prevent HIV, you should first get tested for HIV. You should then be tested every 3 months for as long as you are taking the medicine. Follow these instructions at home: Lifestyle  Do not use any products that contain nicotine or tobacco, such as cigarettes, e-cigarettes, and chewing tobacco. If you need help quitting, ask your health care provider.  Do not use street drugs.  Do not share needles.  Ask your health care provider for help if you need support or information about quitting drugs. Alcohol use  Do not drink alcohol if your health care provider tells you not to drink.  If you drink alcohol: ? Limit how much you have to 0-2 drinks a day. ? Be aware of how much alcohol is in your drink. In the [place] one drink equals one 12 oz bottle of beer (355 mL), one 5 oz glass of wine (148 mL), or one 1 oz glass of hard liquor (44 mL). General instructions  Schedule regular health, dental, and eye exams.  Stay current with your vaccines.  Tell your health care provider if: ? You often feel depressed. ? You have ever been abused or do not feel safe at home. Summary  Adopting a healthy lifestyle and getting preventive care are important in promoting health and wellness.  Follow your health care provider's instructions about healthy diet, exercising, and getting tested or screened for diseases.  Follow your health care provider's instructions on monitoring your cholesterol and blood pressure. This information is not intended to replace advice given to you by your health care provider. Make sure you discuss any questions you have with your  health care provider. Document Released: 07/22/2007 Document Revised: 01/16/2018 Document Reviewed: 01/16/2018 Elsevier Patient Education  2020 [employer].

## 2018-11-15 LAB — CBC
HCT: 40.4 % (ref 39.0–52.0)
Hemoglobin: 13.2 g/dL (ref 13.0–17.0)
MCHC: 32.7 g/dL (ref 30.0–36.0)
MCV: 83.6 fl (ref 78.0–100.0)
Platelets: 465 10*3/uL — ABNORMAL HIGH (ref 150.0–400.0)
RBC: 4.84 Mil/uL (ref 4.22–5.81)
RDW: 14.5 % (ref 11.5–15.5)
WBC: 9.4 10*3/uL (ref 4.0–10.5)

## 2018-11-15 LAB — LIPID PANEL
Cholesterol: 155 mg/dL (ref 0–200)
HDL: 28.5 mg/dL — ABNORMAL LOW (ref >39.00)
LDL Cholesterol: 101 mg/dL — ABNORMAL HIGH (ref 0–99)
NonHDL: 126.03
Total CHOL/HDL Ratio: 5
Triglycerides: 123 mg/dL (ref 0.0–149.0)
VLDL: 24.6 mg/dL (ref 0.0–40.0)

## 2018-11-15 LAB — COMPREHENSIVE METABOLIC PANEL
ALT: 35 U/L (ref 0–53)
AST: 24 U/L (ref 0–37)
Albumin: 4.5 g/dL (ref 3.5–5.2)
Alkaline Phosphatase: 78 U/L (ref 39–117)
BUN: 14 mg/dL (ref 6–23)
CO2: 28 mEq/L (ref 19–32)
Calcium: 9.9 mg/dL (ref 8.4–10.5)
Chloride: 100 mEq/L (ref 96–112)
Creatinine, Ser: 1.67 mg/dL — ABNORMAL HIGH (ref 0.40–1.50)
GFR: 54.32 mL/min — ABNORMAL LOW (ref >60.00)
Glucose, Bld: 81 mg/dL (ref 70–99)
Potassium: 4.4 mEq/L (ref 3.5–5.1)
Sodium: 136 mEq/L (ref 135–145)
Total Bilirubin: 0.7 mg/dL (ref 0.2–1.2)
Total Protein: 7.9 g/dL (ref 6.0–8.3)

## 2018-11-15 LAB — HEMOGLOBIN A1C: Hgb A1c MFr Bld: 4.7 % (ref 4.6–6.5)

## 2018-11-18 ENCOUNTER — Encounter: Payer: Self-pay | Admitting: Family Medicine

## 2018-11-25 ENCOUNTER — Encounter: Payer: Self-pay | Admitting: Family Medicine

## 2018-11-25 DIAGNOSIS — N183 Chronic kidney disease, stage 3 unspecified: Secondary | ICD-10-CM

## 2018-11-25 HISTORY — DX: Chronic kidney disease, stage 3 unspecified: N18.30

## 2018-11-29 ENCOUNTER — Encounter: Payer: Self-pay | Admitting: Family Medicine

## 2018-12-24 ENCOUNTER — Other Ambulatory Visit: Payer: Self-pay

## 2018-12-24 ENCOUNTER — Encounter: Payer: Self-pay | Admitting: Dietician

## 2018-12-24 ENCOUNTER — Encounter: Payer: BC Managed Care – PPO | Attending: General Surgery | Admitting: Dietician

## 2018-12-24 DIAGNOSIS — E669 Obesity, unspecified: Secondary | ICD-10-CM | POA: Diagnosis not present

## 2018-12-24 NOTE — Patient Instructions (Signed)

## 2018-12-24 NOTE — Progress Notes (Signed)
Bariatric Nutrition Follow-Up Visit Medical Nutrition Therapy  Appt Start Time: 7:40am   End Time: 8:10am  2 Months Post-Operative RYGB Surgery Surgery Date: 10/28/2018  Pt's Expectations of Surgery/ Goals: to treat GERD    NUTRITION ASSESSMENT  Anthropometrics  Start weight at [facility]: 267.4 lbs (date: 04/10/2018) Today's weight: 230.8 lbs Weight change: -22 lbs (since previous nutrition visit 6 wks ago)  Body Composition Scale 11/12/2018 12/24/2018  Weight  lbs 252.8 230.8  BMI 35.1 31.3  Total Body Fat  % 30.1 26     Visceral Fat 20 16  Fat-Free Mass  % 69.8 73.9     Total Body Water  % 50.8 54.9     Muscle-Mass  lbs 47.6 43.4  Body Fat Displacement --- ---         Torso  lbs 47.2 37.2         Left Leg  lbs 9.4 7.4         Right Leg  lbs 9.4 7.4         Left Arm  lbs 4.7 3.7         Right Arm  lbs 4.7 3.7   Clinical  Medical hx: obesity, GERD    Lifestyle & Dietary Hx Patient lives with his 44 year old daughter and his partner. Overall reported no complications or issues over the past 6 weeks, other than some nausea when attempting to eat certain foods.  States that seafood does well, better than meat. States he gets full pretty quickly, eats 3x/day. Drinks lots of fluid, primarily water with lemon. States he knows he doesn't eat enough protein. Does not tolerate protein supplements (premade shakes or powders) well. Dislikes greek yogurt. Sometimes will drink a smoothie to "get more nutrition" or have some chocolate milk to get in a little more protein.    Estimated daily fluid intake: 64 oz Estimated daily protein intake: ~40 g Supplements: bariatric MVI, calcium  Current average weekly physical activity: gym ~3x/week    24-Hr Dietary Recall First Meal: cream of wheat   Snack: -  Second Meal: chicken + rice  Snack: -  Third Meal: fish  Snack: - Beverages: water w/ lemon   Post-Op Goals/ Signs/ Symptoms Using straws: yes (states he does best with sipping by using  straws)  Drinking while eating: no Chewing/swallowing difficulties: no Changes in vision: no Changes to mood/headaches: no Hair loss/changes to skin/nails: no Difficulty focusing/concentrating: no Sweating: no Dizziness/lightheadedness: no Palpitations: no  Carbonated/caffeinated beverages: no N/V/D/C/Gas: nausea (this is getting better)  Abdominal pain: no Dumping syndrome: no   NUTRITION DIAGNOSIS  Overweight/obesity ([place]-3.3) related to past poor dietary habits and physical inactivity as evidenced by completed bariatric surgery and following dietary guidelines for continued weight loss and healthy nutrition status.   NUTRITION INTERVENTION Nutrition counseling (C-1) and education (E-2) to facilitate bariatric surgery goals, including: . Diet advancement to the next phase (phase 4) now including non-starchy vegetables  o Emphasized protein intake . The importance of consuming adequate calories as well as certain nutrients daily due to the body's need for essential vitamins, minerals, and fats . The importance of daily physical activity and to reach a goal of at least 150 minutes of moderate to vigorous physical activity weekly (or as directed by their physician) due to benefits such as increased musculature and improved lab values  Handouts Provided Include   Phase 4: Protein + Non-Starchy Vegetables   Bariatric Vitamins & Minerals   Learning Style & Readiness for  Change Teaching method utilized: Visual & Auditory  Demonstrated degree of understanding via: Teach Back  Barriers to learning/adherence to lifestyle change: None Identified    MONITORING & EVALUATION Dietary intake, weekly physical activity, body weight, and goals in 4 months.  Next Steps Patient is to follow-up in 4 months for 6 month post-op follow-up.

## 2018-12-26 ENCOUNTER — Encounter: Payer: Self-pay | Admitting: Family Medicine

## 2018-12-26 DIAGNOSIS — Z9884 Bariatric surgery status: Secondary | ICD-10-CM

## 2018-12-30 ENCOUNTER — Other Ambulatory Visit: Payer: BC Managed Care – PPO

## 2019-01-25 DIAGNOSIS — Z20828 Contact with and (suspected) exposure to other viral communicable diseases: Secondary | ICD-10-CM | POA: Diagnosis not present

## 2019-01-25 DIAGNOSIS — Z03818 Encounter for observation for suspected exposure to other biological agents ruled out: Secondary | ICD-10-CM | POA: Diagnosis not present

## 2019-01-28 ENCOUNTER — Other Ambulatory Visit: Payer: Self-pay | Admitting: Family Medicine

## 2019-01-28 DIAGNOSIS — K219 Gastro-esophageal reflux disease without esophagitis: Secondary | ICD-10-CM

## 2019-02-14 ENCOUNTER — Other Ambulatory Visit (INDEPENDENT_AMBULATORY_CARE_PROVIDER_SITE_OTHER): Payer: BC Managed Care – PPO

## 2019-02-14 DIAGNOSIS — Z9884 Bariatric surgery status: Secondary | ICD-10-CM | POA: Diagnosis not present

## 2019-02-14 LAB — COMPREHENSIVE METABOLIC PANEL
ALT: 24 U/L (ref 0–53)
AST: 18 U/L (ref 0–37)
Albumin: 4.3 g/dL (ref 3.5–5.2)
Alkaline Phosphatase: 81 U/L (ref 39–117)
BUN: 8 mg/dL (ref 6–23)
CO2: 27 mEq/L (ref 19–32)
Calcium: 9.2 mg/dL (ref 8.4–10.5)
Chloride: 106 mEq/L (ref 96–112)
Creatinine, Ser: 1.23 mg/dL (ref 0.40–1.50)
GFR: 77.22 mL/min (ref >60.00)
Glucose, Bld: 103 mg/dL — ABNORMAL HIGH (ref 70–99)
Potassium: 3.8 mEq/L (ref 3.5–5.1)
Sodium: 140 mEq/L (ref 135–145)
Total Bilirubin: 0.5 mg/dL (ref 0.2–1.2)
Total Protein: 7.1 g/dL (ref 6.0–8.3)

## 2019-02-14 LAB — B12 AND FOLATE PANEL
Folate: 18.4 ng/mL (ref >5.9)
Vitamin B-12: 554 pg/mL (ref 211–911)

## 2019-02-14 LAB — FERRITIN: Ferritin: 9.6 ng/mL — ABNORMAL LOW (ref 22.0–322.0)

## 2019-02-15 ENCOUNTER — Encounter: Payer: Self-pay | Admitting: Family Medicine

## 2019-02-21 DIAGNOSIS — I10 Essential (primary) hypertension: Secondary | ICD-10-CM | POA: Diagnosis not present

## 2019-02-21 DIAGNOSIS — R1013 Epigastric pain: Secondary | ICD-10-CM | POA: Diagnosis not present

## 2019-02-21 DIAGNOSIS — E7800 Pure hypercholesterolemia, unspecified: Secondary | ICD-10-CM | POA: Diagnosis not present

## 2019-03-26 DIAGNOSIS — E663 Overweight: Secondary | ICD-10-CM | POA: Diagnosis not present

## 2019-03-26 DIAGNOSIS — K912 Postsurgical malabsorption, not elsewhere classified: Secondary | ICD-10-CM | POA: Diagnosis not present

## 2019-03-26 DIAGNOSIS — I10 Essential (primary) hypertension: Secondary | ICD-10-CM | POA: Diagnosis not present

## 2019-03-26 DIAGNOSIS — R1013 Epigastric pain: Secondary | ICD-10-CM | POA: Diagnosis not present

## 2019-03-26 DIAGNOSIS — Z9884 Bariatric surgery status: Secondary | ICD-10-CM | POA: Diagnosis not present

## 2019-03-26 DIAGNOSIS — K289 Gastrojejunal ulcer, unspecified as acute or chronic, without hemorrhage or perforation: Secondary | ICD-10-CM | POA: Diagnosis not present

## 2019-03-28 ENCOUNTER — Telehealth: Payer: Self-pay

## 2019-03-28 NOTE — Telephone Encounter (Signed)
-----   Message from Milus Banister, MD sent at 03/28/2019  5:41 AM EST ----- Randall Hiss, Thanks.  We'll contact him.  He no showed for an EGD in 2019 and I don't think we've heard from him since.  Mahlet Jergens, He needs OV with myself or extender in the next 2-3 weeks, for post prandial epigastric pains.    Thanks  ----- Message ----- From: Greer Pickerel, MD Sent: 03/27/2019  12:59 PM EST To: Milus Banister, MD  Linna Hoff,  I may have another one. Rigley underwent RYGB back in 10/28/2018 and has done well with weight loss.   Unfortunately he presented about a month ago with epigastric pain with oral intake.  Described as a burning sensation almost immediately with oral intake.  I started him on crushed reflux medication and crushed Carafate for presumed ulcer.  No NSAID use.  No smoker.  Preoperative H. pylori blood test negative  Saw him yesterday for follow-up.  He reports overall he is much better but is still having some mild symptoms generally worse in the evening.  I think he needs an upper endoscopy at this point to evaluate for ulcer versus exposed staples and possible biopsy for H. Pylori.  Asking if you would consider doing this-appears you have seen him in the past.  Thanks Randall Hiss

## 2019-03-28 NOTE — Telephone Encounter (Signed)
The pt has been advised and scheduled for an office visit with Dr Ardis Hughs

## 2019-04-01 ENCOUNTER — Encounter: Payer: Self-pay | Admitting: Gastroenterology

## 2019-04-01 ENCOUNTER — Ambulatory Visit (INDEPENDENT_AMBULATORY_CARE_PROVIDER_SITE_OTHER): Payer: BC Managed Care – PPO | Admitting: Gastroenterology

## 2019-04-01 ENCOUNTER — Other Ambulatory Visit: Payer: Self-pay

## 2019-04-01 VITALS — BP 110/78 | HR 72 | Temp 98.3°F | Ht 71.0 in | Wt 213.0 lb

## 2019-04-01 DIAGNOSIS — R1013 Epigastric pain: Secondary | ICD-10-CM | POA: Diagnosis not present

## 2019-04-01 DIAGNOSIS — Z01818 Encounter for other preprocedural examination: Secondary | ICD-10-CM

## 2019-04-01 NOTE — Progress Notes (Signed)
Review of pertinent gastrointestinal problems: 1.  Morbid obesity.  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 [name].  Preoperative H pylori blood test was negative.   HPI: This is a very pleasant 44 year old man who was referred to me by Dr. Greer Pickerel from [facility] surgery  I saw him 2 or 3 years ago for dysphagia.  We will arrange for an upper endoscopy however he canceled that appointment and we have not heard from him since.  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 [name].  That is 5 months ago.  He has lost 70 pounds since then and he is thrilled with the results.  He has had postprandial burning pain that lasts for a few seconds several times during meals.  No nausea or vomiting.  No melena.  He does not take NSAIDs.  He is very good about taking proton pump inhibitor shortly before his breakfast meal every day.  He eats 2-3 times a day, limited portion still.   Review of systems: Pertinent positive and negative review of systems were noted in the above HPI section. All other review negative.   Past Medical History:  Diagnosis Date  . Childhood asthma   . Chronic renal insufficiency   . Fracture of base of fifth metacarpal bone of right hand   . GERD (gastroesophageal reflux disease)   . Hypertension   . Morbid obesity ([place])   . Pneumonitis 10/04/2018   after accidental inhalation of swimming pool water during swimmng lesson  . 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 Sleep apnea    Mild, no cpap needed    Past Surgical History:  Procedure Laterality Date  . ADENOIDECTOMY     childhood  . 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 HAND SURGERY Right 02/2016  . UPPER GI ENDOSCOPY  03/10/2016    Current Outpatient Medications  Medication Sig Dispense Refill  . amLODipine (NORVASC) 5 MG  tablet Take 1 tablet (5 mg total) by mouth daily. (Patient taking differently: Take 2.5 mg by mouth daily. ) 90 tablet 3  . omeprazole (PRILOSEC) 40 MG capsule Take 1 capsule (40 mg total) by mouth daily. 90 capsule 1   No current facility-administered medications for this visit.    Allergies as of 04/01/2019  . (No Known Allergies)    Family History  Problem Relation Age of Onset  . Kidney cancer Mother 103  . Hypertension Father        age 71  . Diabetes Father   . Lung cancer Paternal Uncle   . Colon cancer Neg Hx   . Prostate cancer Neg Hx   . Liver disease Neg Hx   . Esophageal cancer Neg Hx   . Stomach cancer Neg Hx   . Pancreatic cancer Neg Hx     Social History   Socioeconomic History  . Marital status: Single    Spouse name: Not on file  . Number of children: Not on file  . Years of education: Not on file  . Highest education level: Not on file  Occupational History  . Not on file  Tobacco Use  . Smoking status: Never Smoker  . Smokeless tobacco: Never Used  Substance and Sexual Activity  . Alcohol use: No    Alcohol/week: 0.0 standard drinks  . Drug use: No  .  Sexual activity: Yes    Partners: Male  Other Topics Concern  . Not on file  Social History Narrative   Occupation: Works for lab   Single    Adopted daughter - 4   Moved from [place] 4 yrs ago.   Never Smoked   Alcohol use-no   Caffeine use/day:  None   Does Patient Exercise:  yes            Social Determinants of Health   Financial Resource Strain:   . Difficulty of Paying Living Expenses: Not on file  Food Insecurity:   . Worried About Charity fundraiser in the Last Year: Not on file  . Ran Out of Food in the Last Year: Not on file  Transportation Needs:   . Lack of Transportation (Medical): Not on file  . Lack of Transportation (Non-Medical): Not on file  Physical Activity:   . Days of Exercise per Week: Not on file  . Minutes of Exercise per Session: Not on file  Stress:    . Feeling of Stress : Not on file  Social Connections:   . Frequency of Communication with Friends and Family: Not on file  . Frequency of Social Gatherings with Friends and Family: Not on file  . Attends Religious Services: Not on file  . Active Member of Clubs or Organizations: Not on file  . Attends Archivist Meetings: Not on file  . Marital Status: Not on file  Intimate Partner Violence:   . Fear of Current or Ex-Partner: Not on file  . Emotionally Abused: Not on file  . Physically Abused: Not on file  . Sexually Abused: Not on file     Physical Exam: BP 110/78   Pulse 72   Temp 98.3 F (36.8 C)   Ht 5' 11"  (1.803 m)   Wt 213 lb (96.6 kg)   BMI 29.71 kg/m  Constitutional: generally well-appearing Psychiatric: alert and oriented x3 Eyes: extraocular movements intact Mouth: oral pharynx moist, no lesions Neck: supple no lymphadenopathy Cardiovascular: heart regular rate and rhythm Lungs: clear to auscultation bilaterally Abdomen: soft, nontender, nondistended, no obvious ascites, no peritoneal signs, normal bowel sounds Extremities: no lower extremity edema bilaterally Skin: no lesions on visible extremities   Assessment and plan: 44 y.o. male with postprandial epigastric burning since Roux-en-Y gastric bypass 5 months ago  His symptoms of are not too severe but certainly seem quite bothersome.  Possibly anastomotic ulcer, possibly exposed surgical stitch or staple, perhaps H. pylori related however preoperative blood testing was negative.  I recommended upper endoscopy as first step in evaluation here.   Please see the "Patient Instructions" section for addition details about the plan.   Owens Loffler, MD [facility] 04/01/2019, 10:33 AM  Cc: Greer Pickerel MD, [facility] Total time on date of encounter was 45  minutes (this included time spent preparing to see the patient reviewing records; obtaining and/or reviewing  separately obtained history; performing a medically appropriate exam and/or evaluation; counseling and educating the patient and family if present; ordering medications, tests or procedures if applicable; and documenting clinical information in the health record).

## 2019-04-01 NOTE — Patient Instructions (Addendum)
If you are age 45 or older, your body mass index should be between 23-30. Your Body mass index is 29.71 kg/m. If this is out of the aforementioned range listed, please consider follow up with your Primary Care Provider.  If you are age 26 or younger, your body mass index should be between 19-25. Your Body mass index is 29.71 kg/m. If this is out of the aformentioned range listed, please consider follow up with your Primary Care Provider.   Please continue taking Omeprazole 88m daily as you have been instructed.    You have been scheduled for an endoscopy. Please follow written instructions given to you at your visit today. If you use inhalers (even only as needed), please bring them with you on the day of your procedure.  Due to recent changes in healthcare laws, you may see the results of your imaging and laboratory studies on MyChart before your provider has had a chance to review them.  We understand that in some cases there may be results that are confusing or concerning to you. Not all laboratory results come back in the same time frame and the provider may be waiting for multiple results in order to interpret others.  Please give [id number] hours in order for your provider to thoroughly review all the results before contacting the office for clarification of your results.   Thank you, Dr JArdis Hughs

## 2019-04-15 ENCOUNTER — Ambulatory Visit: Payer: BC Managed Care – PPO | Admitting: Skilled Nursing Facility1

## 2019-04-24 ENCOUNTER — Ambulatory Visit (INDEPENDENT_AMBULATORY_CARE_PROVIDER_SITE_OTHER): Payer: BC Managed Care – PPO

## 2019-04-24 ENCOUNTER — Other Ambulatory Visit: Payer: Self-pay | Admitting: Gastroenterology

## 2019-04-24 DIAGNOSIS — Z1159 Encounter for screening for other viral diseases: Secondary | ICD-10-CM

## 2019-04-24 DIAGNOSIS — M5136 Other intervertebral disc degeneration, lumbar region: Secondary | ICD-10-CM | POA: Diagnosis not present

## 2019-04-24 DIAGNOSIS — Z6835 Body mass index (BMI) 35.0-35.9, adult: Secondary | ICD-10-CM | POA: Diagnosis not present

## 2019-04-24 DIAGNOSIS — M545 Low back pain: Secondary | ICD-10-CM | POA: Diagnosis not present

## 2019-04-24 LAB — SARS CORONAVIRUS 2 (TAT 6-24 HRS): SARS Coronavirus 2: NEGATIVE

## 2019-04-28 ENCOUNTER — Other Ambulatory Visit: Payer: Self-pay

## 2019-04-28 ENCOUNTER — Encounter: Payer: Self-pay | Admitting: Gastroenterology

## 2019-04-28 ENCOUNTER — Ambulatory Visit (AMBULATORY_SURGERY_CENTER): Payer: BC Managed Care – PPO | Admitting: Gastroenterology

## 2019-04-28 VITALS — BP 114/75 | HR 54 | Temp 97.3°F | Resp 9

## 2019-04-28 DIAGNOSIS — K2950 Unspecified chronic gastritis without bleeding: Secondary | ICD-10-CM | POA: Diagnosis not present

## 2019-04-28 DIAGNOSIS — R1013 Epigastric pain: Secondary | ICD-10-CM

## 2019-04-28 DIAGNOSIS — Z9884 Bariatric surgery status: Secondary | ICD-10-CM | POA: Diagnosis not present

## 2019-04-28 DIAGNOSIS — T182XXA Foreign body in stomach, initial encounter: Secondary | ICD-10-CM

## 2019-04-28 MED ORDER — SODIUM CHLORIDE 0.9 % IV SOLN: 500.0000 mL | Freq: Once | INTRAVENOUS | Status: DC

## 2019-04-28 NOTE — Op Note (Signed)
[facility] Patient Name: Erik Weber Procedure Date: 04/28/2019 2:00 PM MRN: [id number] Endoscopist: Milus Banister , MD Age: 44 Referring MD:  Date of Birth: 1976-01-09 Gender: Male Account #: [id number] Procedure:                Upper GI endoscopy Indications:              Epigastric abdominal pain, mild intermittent since                            his roux en Y gastric bypass 5-6 months ago Medicines:                Monitored Anesthesia Care Procedure:                Pre-Anesthesia Assessment:                           - Prior to the procedure, a History and Physical                            was performed, and patient medications and                            allergies were reviewed. The patient's tolerance of                            previous anesthesia was also reviewed. The risks                            and benefits of the procedure and the sedation                            options and risks were discussed with the patient.                            All questions were answered, and informed consent                            was obtained. Prior Anticoagulants: The patient has                            taken no previous anticoagulant or antiplatelet                            agents. ASA Grade Assessment: II - A patient with                            mild systemic disease. After reviewing the risks                            and benefits, the patient was deemed in                            satisfactory condition to undergo the procedure.  After obtaining informed consent, the endoscope was                            passed under direct vision. Throughout the                            procedure, the patient's blood pressure, pulse, and                            oxygen saturations were monitored continuously. The                            Endoscope was introduced through the mouth, and                            advanced to  the afferent and efferent jejunal                            loops. The upper GI endoscopy was accomplished                            without difficulty. The patient tolerated the                            procedure well. Scope In: Scope Out: Findings:                 Anatomically typical appearing small gastric pouch,                            s/p Roux gastric bypass 5 months ago.                           Normal afferent and efferent limbs (intubated                            5-10cm each)                           Three exposed surgical staples at an otherwise                            normal GJ anastomosis. All three staples were                            removed with forceps.                           Mild inflammation characterized by granularity was                            found in the gastric remnant pouch. Biopsies were                            taken with a cold forceps for histology.  The exam was otherwise without abnormality. Complications:            No immediate complications. Estimated blood loss:                            None. Estimated Blood Loss:     Estimated blood loss: none. Impression:               - Anatomically typical appearing small gastric                            pouch, s/p Roux gastric bypass 5 months ago.                           - Normal afferent and efferent limbs (intubated                            5-10cm each)                           - Three exposed surgical staples at an otherwise                            normal GJ anastomosis. All three staples were                            removed with forceps.                           - Mild inflammation characterized by granularity                            was found in the gastric remnant pouch. Biopsies                            were taken with a cold forceps for histology. Recommendation:           - Patient has a contact number available for                             emergencies. The signs and symptoms of potential                            delayed complications were discussed with the                            patient. Return to normal activities tomorrow.                            Written discharge instructions were provided to the                            patient.                           - Resume previous diet.                           -  Continue present medications.                           - Await pathology results. Milus Banister, MD 04/28/2019 2:20:52 PM This report has been signed electronically.

## 2019-04-28 NOTE — Progress Notes (Signed)
Pt's states no medical or surgical changes since previsit or office visit. 

## 2019-04-28 NOTE — Progress Notes (Signed)
Called to room to assist during endoscopic procedure.  Patient ID and intended procedure confirmed with present staff. Received instructions for my participation in the procedure from the performing physician.  

## 2019-04-28 NOTE — Patient Instructions (Signed)
YOU HAD AN ENDOSCOPIC PROCEDURE TODAY AT [facility]:   Refer to the procedure report that was given to you for any specific questions about what was found during the examination.  If the procedure report does not answer your questions, please call your gastroenterologist to clarify.  If you requested that your care partner not be given the details of your procedure findings, then the procedure report has been included in a sealed envelope for you to review at your convenience later.  YOU SHOULD EXPECT: Some feelings of bloating in the abdomen. Passage of more gas than usual.  Walking can help get rid of the air that was put into your GI tract during the procedure and reduce the bloating. If you had a lower endoscopy (such as a colonoscopy or flexible sigmoidoscopy) you may notice spotting of blood in your stool or on the toilet paper. If you underwent a bowel prep for your procedure, you may not have a normal bowel movement for a few days.  Please Note:  You might notice some irritation and congestion in your nose or some drainage.  This is from the oxygen used during your procedure.  There is no need for concern and it should clear up in a day or so.  SYMPTOMS TO REPORT IMMEDIATELY:    Following upper endoscopy (EGD)  Vomiting of blood or coffee ground material  New chest pain or pain under the shoulder blades  Painful or persistently difficult swallowing  New shortness of breath  Fever of 100F or higher  Black, tarry-looking stools  For urgent or emergent issues, a gastroenterologist can be reached at any hour by calling [phone]. Do not use MyChart messaging for urgent concerns.    DIET:  We do recommend a small meal at first, but then you may proceed to your regular diet.  Drink plenty of fluids but you should avoid alcoholic beverages for 24 hours.  ACTIVITY:  You should plan to take it easy for the rest of today and you should NOT DRIVE or use heavy machinery  until tomorrow (because of the sedation medicines used during the test).    FOLLOW UP: Our staff will call the number listed on your records 48-72 hours following your procedure to check on you and address any questions or concerns that you may have regarding the information given to you following your procedure. If we do not reach you, we will leave a message.  We will attempt to reach you two times.  During this call, we will ask if you have developed any symptoms of COVID 19. If you develop any symptoms (ie: fever, flu-like symptoms, shortness of breath, cough etc.) before then, please call [phone].  If you test positive for Covid 19 in the 2 weeks post procedure, please call and report this information to [place].    If any biopsies were taken you will be contacted by phone or by letter within the next 1-3 weeks.  Please call us at [phone] if you have not heard about the biopsies in 3 weeks.    SIGNATURES/CONFIDENTIALITY: You and/or your care partner have signed paperwork which will be entered into your electronic medical record.  These signatures attest to the fact that that the information above on your After Visit Summary has been reviewed and is understood.  Full responsibility of the confidentiality of this discharge information lies with you and/or your care-partner.

## 2019-04-28 NOTE — Progress Notes (Signed)
pt tolerated well. VSS. awake and to recovery. Report given to RN.  

## 2019-04-30 ENCOUNTER — Telehealth: Payer: Self-pay | Admitting: *Deleted

## 2019-04-30 NOTE — Telephone Encounter (Signed)
1. Have you developed a fever since your procedure? no  2.   Have you had an respiratory symptoms (SOB or cough) since your procedure? no  3.   Have you tested positive for COVID 19 since your procedure no 4.   Have you had any family members/close contacts diagnosed with the COVID 19 since your procedure?  no   If yes to any of these questions please route to Joylene John, RN and Erenest Rasher, RN Follow up Call-  Call back number 04/28/2019 11/09/2017  Post procedure Call Back phone  # [phone]  Permission to leave phone message Yes Yes  Some recent data might be hidden     Patient questions:  Do you have a fever, pain , or abdominal swelling? No. Pain Score  0 *  Have you tolerated food without any problems? Yes.    Have you been able to return to your normal activities? Yes.    Do you have any questions about your discharge instructions: Diet   No. Medications  No. Follow up visit  No.  Do you have questions or concerns about your Care? No.  Actions: * If pain score is 4 or above: No action needed, pain <4.

## 2019-05-02 ENCOUNTER — Encounter: Payer: Self-pay | Admitting: Gastroenterology

## 2019-05-20 ENCOUNTER — Encounter: Payer: Self-pay | Admitting: Dietician

## 2019-05-20 ENCOUNTER — Encounter: Payer: BC Managed Care – PPO | Attending: General Surgery | Admitting: Dietician

## 2019-05-20 ENCOUNTER — Other Ambulatory Visit: Payer: Self-pay

## 2019-05-20 DIAGNOSIS — Z9884 Bariatric surgery status: Secondary | ICD-10-CM | POA: Diagnosis not present

## 2019-05-20 NOTE — Patient Instructions (Signed)
.   Continue to aim for a minimum of 64 fluid ounces daily with at least 32 ounces being plain water . Eat non-starchy vegetables 2 times a day 7 days a week . Per meal/snack, eat 2-3 ounces of protein first  o  Add starchy vegetables (potatoes, sweet potatoes, corn, peas) as desired as long as you are able to meet your daily protein goal and eat non-starchy vegetables 2+ times per day . Continue to aim for 30 minutes of physical activity at least 5 times a week . Remember to take 3 calcium's plus your bariatric multivitamin DAILY

## 2019-05-20 NOTE — Progress Notes (Signed)
6 Month Post-Op Class  Medical Nutrition Therapy Start Time: 2:00pm     End Time: 3:15pm  Primary Concerns Today: 6 Months Post-Operative Bariatric Surgery Nutrition Education   Surgery Date: 10/28/2018 Surgery Type: RYGB  Having difficulty getting adequate protein in, states he has discussed this with his surgeon.   Body Composition Scale 11/12/2018 12/24/2018 05/20/2019  Weight  lbs 252.8 230.8 209.6  BMI 35.1 31.3 28.4  Total Body Fat  % 30.1 26 21.9     Visceral Fat 20 16 13   Fat-Free Mass  % 69.8 73.9 78     Total Body Water  % 50.8 54.9 59     Muscle-Mass  lbs 47.6 43.4 39.4  Body Fat Displacement --- --- ---         Torso  lbs 47.2 37.2 28.4         Left Leg  lbs 9.4 7.4 5.6         Right Leg  lbs 9.4 7.4 5.6         Left Arm  lbs 4.7 3.7 2.8         Right Arm  lbs 4.7 3.7 2.8   Drinking while eating: no Chewing/swallowing difficulties: no Changes in vision: no Changes to mood/headaches: no Hair loss/Changes to skin/nails: no Any difficulty focusing or concentrating: no Sweating: no Dizziness/Lightheadedness: no Palpitations: no  Carbonated beverages: no N/V/D/C/Gas: no Abdominal Pain: no Dumping syndrome: no   Progress Towards Pt Chosen Goals: in progress   Information Reviewed/ Discussed During Appointment: - Review of composition scale numbers - Fluid requirements (> 64-100 ounces) - Protein requirements (> 60-80 grams) - Strategies for tolerating diet - Advancement of diet to include starchy vegetables - Barriers to inclusion of new foods - Inclusion of appropriate multivitamin and calcium supplements  - Exercise recommendations   Handouts Provided Include:  Phase V: Protein + All Vegetables  The Benefits of Exercise are Endless  Should I Eat?   Measurements & Portion Sizes   Bariatric Resources (Apps & Websites)   Teaching Method Utilized: Visual & Auditory Demonstrated Degree of Understanding via: Teach Back   Monitoring/Evaluation:   Dietary intake, exercise, and body weight.  Patient is to follow up at [facility] in 3 months for 9 month post-op visit.

## 2019-05-26 DIAGNOSIS — M5136 Other intervertebral disc degeneration, lumbar region: Secondary | ICD-10-CM | POA: Diagnosis not present

## 2019-06-16 DIAGNOSIS — M545 Low back pain: Secondary | ICD-10-CM | POA: Diagnosis not present

## 2019-06-16 DIAGNOSIS — M5136 Other intervertebral disc degeneration, lumbar region: Secondary | ICD-10-CM | POA: Diagnosis not present

## 2019-06-23 DIAGNOSIS — M5136 Other intervertebral disc degeneration, lumbar region: Secondary | ICD-10-CM | POA: Diagnosis not present

## 2019-07-10 DIAGNOSIS — M5136 Other intervertebral disc degeneration, lumbar region: Secondary | ICD-10-CM | POA: Diagnosis not present

## 2019-07-10 DIAGNOSIS — Z6835 Body mass index (BMI) 35.0-35.9, adult: Secondary | ICD-10-CM | POA: Diagnosis not present

## 2019-08-06 DIAGNOSIS — I10 Essential (primary) hypertension: Secondary | ICD-10-CM | POA: Diagnosis not present

## 2019-08-06 DIAGNOSIS — D509 Iron deficiency anemia, unspecified: Secondary | ICD-10-CM | POA: Diagnosis not present

## 2019-08-06 DIAGNOSIS — N183 Chronic kidney disease, stage 3 unspecified: Secondary | ICD-10-CM | POA: Diagnosis not present

## 2019-08-06 DIAGNOSIS — E669 Obesity, unspecified: Secondary | ICD-10-CM | POA: Diagnosis not present

## 2019-08-25 ENCOUNTER — Ambulatory Visit: Payer: BC Managed Care – PPO | Admitting: Internal Medicine

## 2019-08-25 ENCOUNTER — Encounter: Payer: Self-pay | Admitting: Internal Medicine

## 2019-08-25 ENCOUNTER — Other Ambulatory Visit: Payer: Self-pay

## 2019-08-25 ENCOUNTER — Ambulatory Visit (INDEPENDENT_AMBULATORY_CARE_PROVIDER_SITE_OTHER): Payer: BC Managed Care – PPO | Admitting: Internal Medicine

## 2019-08-25 VITALS — BP 125/82 | HR 52 | Temp 98.2°F | Resp 18 | Ht 71.0 in | Wt 198.0 lb

## 2019-08-25 DIAGNOSIS — R1032 Left lower quadrant pain: Secondary | ICD-10-CM | POA: Diagnosis not present

## 2019-08-25 DIAGNOSIS — K4090 Unilateral inguinal hernia, without obstruction or gangrene, not specified as recurrent: Secondary | ICD-10-CM

## 2019-08-25 LAB — POC URINALSYSI DIPSTICK (AUTOMATED)
Bilirubin, UA: NEGATIVE
Blood, UA: NEGATIVE
Glucose, UA: NEGATIVE
Ketones, UA: NEGATIVE
Leukocytes, UA: NEGATIVE
Nitrite, UA: NEGATIVE
Protein, UA: NEGATIVE
Spec Grav, UA: >=1.03 — AB (ref 1.010–1.025)
Urobilinogen, UA: 0.2 E.U./dL
pH, UA: 5 (ref 5.0–8.0)

## 2019-08-25 NOTE — Progress Notes (Signed)
Subjective:    Patient ID: Erik Weber, male    DOB: 06-15-1975, 44 y.o.   MRN: [id number]  DOS:  08/25/2019 Type of visit - description: Acute Yesterday, he did some heavy lifting moving boxes around. Shortly after developed gargling sounds at the abdomen and noted a lump at the left suprapubic area. He was able to press down the lump and felt some relief.  Denies any scrotal pain or swelling. No dysuria, gross hematuria or difficulty urinating.  He suspects a  hernia.  Review of Systems See above. No nausea or vomiting  Past Medical History:  Diagnosis Date  . Childhood asthma   . Chronic renal insufficiency   . Fracture of base of fifth metacarpal bone of right hand   . GERD (gastroesophageal reflux disease)   . Hypertension   . Morbid obesity ([place])   . Pneumonitis 10/04/2018   after accidental inhalation of swimming pool water during swimmng lesson  . 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 Sleep apnea    Mild, no cpap needed    Past Surgical History:  Procedure Laterality Date  . ADENOIDECTOMY     childhood  . 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 HAND SURGERY Right 02/2016  . UPPER GI ENDOSCOPY  03/10/2016    Allergies as of 08/25/2019   No Known Allergies     Medication List       Accurate as of August 25, 2019 10:55 PM. If you have any questions, ask your nurse or doctor.        STOP taking these medications   MULTIPLE VITAMINS ESSENTIAL PO Stopped by: Kathlene November, MD   omeprazole 40 MG capsule Commonly known as: PRILOSEC Stopped by: Kathlene November, MD     TAKE these medications   amLODipine 5 MG tablet Commonly known as: NORVASC Take 1 tablet (5 mg total) by mouth daily. What changed: how much to take   multivitamin with minerals tablet Take 1 tablet by mouth daily.          Objective:    Physical Exam Abdominal:      BP 125/82 (BP Location: Left Arm, Patient Position: Sitting, Cuff Size: Normal)   Pulse (!) 52   Temp 98.2 F (36.8 C) (Oral)   Resp 18   Ht 5' 11"  (1.803 m)   Wt 198 lb (89.8 kg)   SpO2 100%   BMI 27.62 kg/m   General:   Well developed, NAD, BMI noted.  HEENT:  Normocephalic . Face symmetric, atraumatic delete Abdomen:  Not distended, soft, non-tender. No rebound or rigidity.  Bowel sounds normal GU: Scrotal contents and penis normal Skin: Not pale. Not jaundice Lower extremities: no pretibial edema bilaterally  Neurologic:  alert & oriented X3.  Speech normal, gait appropriate for age and unassisted Psych--  Cognition and judgment appear intact.  Cooperative with normal attention span and concentration.  Behavior appropriate. No anxious or depressed appearing.     Assessment    44 year old male, history of HTN, OSA, GERD, hypogonadism, mild  CKD, h/o obesity s/p bariatric surgery presents with: Suprapubic hernia: Symptoms as described above, hernia is a slightly tender but reducible. Plan: Refer to surgery, will ask to see within few days. Indications for ER including increased pain, unable to gently reduce the hernia, nausea or vomiting discussed  with the patient. He verbalized understanding.   This visit occurred during the SARS-CoV-2 public health emergency.  Safety protocols were in place, including screening questions prior to the visit, additional usage of staff PPE, and extensive cleaning of exam room while observing appropriate contact time as indicated for disinfecting solutions.

## 2019-08-25 NOTE — Patient Instructions (Signed)
Inguinal Hernia, Adult An inguinal hernia is when fat or your intestines push through a weak spot in a muscle where your leg meets your lower belly (groin). This causes a rounded lump (bulge). This kind of hernia could also be:  In your scrotum, if you are male.  In folds of skin around your vagina, if you are male. There are three types of inguinal hernias. These include:  Hernias that can be pushed back into the belly (are reducible). This type rarely causes pain.  Hernias that cannot be pushed back into the belly (are incarcerated).  Hernias that cannot be pushed back into the belly and lose their blood supply (are strangulated). This type needs emergency surgery. If you do not have symptoms, you may not need treatment. If you have symptoms or a large hernia, you may need surgery. Follow these instructions at home: Lifestyle  Do these things if told by your doctor so you do not have trouble pooping (constipation): ? Drink enough fluid to keep your pee (urine) pale yellow. ? Eat foods that have a lot of fiber. These include fresh fruits and vegetables, whole grains, and beans. ? Limit foods that are high in fat and processed sugars. These include foods that are fried or sweet. ? Take medicine for trouble pooping.  Avoid lifting heavy objects.  Avoid standing for long amounts of time.  Do not use any products that contain nicotine or tobacco. These include cigarettes and e-cigarettes. If you need help quitting, ask your doctor.  Stay at a healthy weight. General instructions  You may try to push your hernia in by very gently pressing on it when you are lying down. Do not try to force the bulge back in if it will not push in easily.  Watch your hernia for any changes in shape, size, or color. Tell your doctor if you see any changes.  Take over-the-counter and prescription medicines only as told by your doctor.  Keep all follow-up visits as told by your doctor. This is  important. Contact a doctor if:  You have a fever.  You have new symptoms.  Your symptoms get worse. Get help right away if:  The area where your leg meets your lower belly has: ? Pain that gets worse suddenly. ? A bulge that gets bigger suddenly, and it does not get smaller after that. ? A bulge that turns red or purple. ? A bulge that is painful when you touch it.  You are a man, and your scrotum: ? Suddenly feels painful. ? Suddenly changes in size.  You cannot push the hernia in by very gently pressing on it when you are lying down. Do not try to force the bulge back in if it will not push in easily.  You feel sick to your stomach (nauseous), and that feeling does not go away.  You throw up (vomit), and that keeps happening.  You have a fast heartbeat.  You cannot poop (have a bowel movement) or pass gas. These symptoms may be an emergency. Do not wait to see if the symptoms will go away. Get medical help right away. Call your local emergency services (911 in the [place] Summary  An inguinal hernia is when fat or your intestines push through a weak spot in a muscle where your leg meets your lower belly (groin). This causes a rounded lump (bulge).  If you do not have symptoms, you may not need treatment. If you have symptoms or a large hernia,  you may need surgery.  Avoid lifting heavy objects. Also avoid standing for long amounts of time.  Do not try to force the bulge back in if it will not push in easily. This information is not intended to replace advice given to you by your health care provider. Make sure you discuss any questions you have with your health care provider. Document Revised: 02/24/2017 Document Reviewed: 10/25/2016 Elsevier Patient Education  [place].

## 2019-08-25 NOTE — Progress Notes (Signed)
Pre visit review using our clinic review tool, if applicable. No additional management support is needed unless otherwise documented below in the visit note. 

## 2019-09-05 ENCOUNTER — Ambulatory Visit: Payer: Self-pay | Admitting: General Surgery

## 2019-09-05 DIAGNOSIS — K4090 Unilateral inguinal hernia, without obstruction or gangrene, not specified as recurrent: Secondary | ICD-10-CM | POA: Diagnosis not present

## 2019-09-05 DIAGNOSIS — Z9884 Bariatric surgery status: Secondary | ICD-10-CM | POA: Diagnosis not present

## 2019-09-05 NOTE — H&P (Signed)
[name] Appointment: 09/05/2019 1:30 PM Location: [facility] Patient #: [id number] DOB: [birth year] Single / Language: Erik Weber / Race: Black or African American Male   History of Present Illness Erik Hiss M. [name] MD; 09/05/2019 1:53 PM) The patient is a 44 year old male presenting status-post bariatric surgery. He comes in today to discuss a bulge in his groin. His PCP sent him. He underwent gastric bypass surgery about 11 months ago. He states he is moving some furniture and he developed some discomfort in his lower abdomen. He initially thought it was some gas and took a laxative without improvement. He then noticed a bulge in his left groin. It is very painful. He is saw his PCP and was able to push it back in. He states it is not as prominent as it was. It is not as painful as it was either. He describes as an aching sensation. He denies any burning, shooting or stabbing pain. He has a bowel movement daily. He is taking his multivitamin calcium. He denies any heartburn or reflux. He denies any epigastric pain. He does have some occasional dizziness when walking. He is wondering if it's his blood pressure. He is still on a very low-dose of Norvasc. I reviewed the PCP note     03/27/19 He comes in for follow-up after undergoing laparoscopic Roux-en-Y gastric bypass surgery on October 28, 2018. his initial visit weight was 277.8 pounds. His preoperative weight was 276 pounds. His last visit was 1/15 with a weight of 213 pounds. His weight today is 214 pounds. He comes in after being seen about a month ago for upper abdominal pain with oral intake. He described as a burning sensation. We put him on medication for presumed marginal ulcer. He has been taking omeprazole crushed along with crushed Carafate. He states that overall he is better but he is still having some discomfort in his upper abdomen generally more so at night. He states that it burns for second.  He denies any fevers or chills. Still no NSAID use or smoking. He is going to the gym 3 days a week mainly doing cardio but doing some occasional lifting.   He is getting in over 60 ounces of liquid per day. The burning sensation will always happen immediately. It last a few seconds. He is taking his multivitamin and calcium. He is taking a small dose of amlodipine. He denies any syncopal episodes. He reports normal urination. He had blood work done on October 2020 CBC was normal. Triglycerides were improved from 247-123. He denies any melena or hematochezia. no fever/chills.  His comorbidities include hypertension, hypercholesterolemia, obstructive sleep apnea   Problem List/Past Medical Erik Hiss M. 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 [name], MD; 09/05/2019 1:55 PM) No pertinent past surgical history   Diagnostic Studies History Erik Hiss M. [name], MD; 09/05/2019 1:55 PM) Colonoscopy  never  Allergies Erik Weber, Weber; 09/05/2019 1:24 PM) No Known Drug Allergies  [11/02/2017]: Allergies Reconciled   Medication History Erik Hiss M. [name], MD; 09/05/2019 1:55 PM) Multi-Vitamin (Oral) Active. amLODIPine Besylate (2.5MG Tablet, Oral) Active. Medications Reconciled Omeprazole (40MG Capsule DR, Oral) Active. Calcium (Oral) Specific strength unknown - Active.  Social History Erik Hiss  [name], MD; 09/05/2019 1:55 PM) Alcohol use  Occasional alcohol use. Caffeine use  Carbonated beverages. No drug use  Tobacco use  Never smoker.  Family History Erik Hiss M. [name], MD; 09/05/2019 1:55 PM) Arthritis   Father, Mother. Cancer  Mother. Heart disease in male family member before age 63   Other Problems Erik Ruff. [name], MD; 09/05/2019 1:55 PM) Gastroesophageal Reflux Disease  High blood pressure  Asthma  Back Pain     Review of Systems Erik Hiss M. [name] MD; 09/05/2019 1:52 PM) All other systems negative  Vitals Erik Weber; 09/05/2019 1:25 PM) 09/05/2019 1:25 PM Weight: 200.13 lb Height: 71.5in Body Surface Area: 2.12 m Body Mass Index: 27.52 kg/m  Temp.: 97.46F  Pulse: 60 (Regular)  P.OX: 96% (Room air) BP: 120/82(Sitting, Left Arm, Standard)       Physical Exam Erik Hiss M. [name] MD; 09/05/2019 1:51 PM) General Mental Status-Alert. General Appearance-Consistent with stated age. Hydration-Well hydrated. Voice-Normal.  Head and Neck Head-normocephalic, atraumatic with no lesions or palpable masses. Trachea-midline. Thyroid Gland Characteristics - normal size and consistency.  Eye Eyeball - Bilateral-Normal. Sclera/Conjunctiva - Bilateral-No scleral icterus.  Chest and Lung Exam Chest and lung exam reveals -quiet, even and easy respiratory effort with no use of accessory muscles and on auscultation, normal breath sounds, no adventitious sounds and normal vocal resonance. Inspection Chest Wall - Normal. Back - normal.  Breast - Did not examine.  Cardiovascular Cardiovascular examination reveals -normal heart sounds, regular rate and rhythm with no murmurs and normal pedal pulses bilaterally.  Abdomen Inspection Inspection of the abdomen reveals - No Hernias. Skin - Scar - Note: well healed trocar scars. Palpation/Percussion Palpation and Percussion of the abdomen reveal - Soft, Non Tender, No Rebound tenderness, No Rigidity (guarding) and No hepatosplenomegaly. Auscultation Auscultation of the abdomen reveals - Bowel sounds normal.  Male Genitourinary Note: Obvious small bulge in left groin. Both testicles descended.  No bulge in right groin with Valsalva. Reducible bulge in left groin with Valsalva   Peripheral Vascular Upper Extremity Palpation - Pulses bilaterally normal.  Neurologic Neurologic evaluation reveals -alert and oriented x 3 with no impairment of recent or remote memory. Mental Status-Normal.  Neuropsychiatric The patient's mood and affect are described as -normal. Judgment and Insight-insight is appropriate concerning matters relevant to self.  Musculoskeletal Normal Exam - Left-Upper Extremity Strength Normal and Lower Extremity Strength Normal. Normal Exam - Right-Upper Extremity Strength Normal and Lower Extremity Strength Normal.  Lymphatic Head & Neck  General Head & Neck Lymphatics: Bilateral - Description - Normal. Axillary - Did not examine. Femoral & Inguinal - Did not examine.    Assessment & Plan Erik Hiss M. 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 continue bid MVI and calcium. Discussed his lightheadedness. It doesn't sound like dumping. He is still on a blood pressure medicines it could be blood pressure or my guess is Shift in blood glucose and insulin levels. I encouraged him to keep a food diary and to follow-up the dietitian as well as to monitor his blood pressure LEFT INGUINAL HERNIA (K40.90) Impression: We discussed the etiology of inguinal hernias. We discussed the signs & symptoms of incarceration & strangulation. We discussed non-operative and operative management. We discussed both open as well as minimally invasive approaches such as laparoscopic and robotic  The patient has elected proceed with robotic repair of left inguinal hernia with mesh   I  described the procedure in detail. The patient was given educational material. We discussed the risks and benefits including but not limited to bleeding, infection, chronic inguinal  pain, nerve entrapment, hernia recurrence, mesh complications, hematoma formation, urinary retention, injury to the testicle, numbness in the groin, blood clots, injury to the surrounding structures, and anesthesia risk. We also discussed the typical post operative recovery course, including no heavy lifting for 4-6 weeks. I explained that the likelihood of improvement of their symptoms is good    This patient encounter took 29 minutes today to perform the following: take history, perform exam, review outside records, interpret imaging, counsel the patient on their diagnosis and document encounter, findings & plan in the EHR Current Plans Pt Education - Pamphlet Given - Laparoscopic Hernia Repair: discussed with patient and provided information. You are being scheduled for surgery- Our schedulers will call you.  You should hear from our office's scheduling department within 5 working days about the location, date, and time of surgery. We try to make accommodations for patient's preferences in scheduling surgery, but sometimes the OR schedule or the surgeon's schedule prevents [place] from making those accommodations.  If you have not heard from our office [phone]) in 5 working days, call the office and ask for your surgeon's nurse.  If you have other questions about your diagnosis, plan, or surgery, call the office and ask for your surgeon's nurse.  Erik Ruff. [name], MD, FACS General, Bariatric, & Minimally Invasive Surgery [facility], [place]

## 2019-09-29 NOTE — Progress Notes (Signed)
DUE TO COVID-19 ONLY ONE VISITOR IS ALLOWED TO COME WITH YOU AND STAY IN THE WAITING ROOM ONLY DURING PRE OP AND PROCEDURE DAY OF SURGERY. THE 1 VISITOR  MAY VISIT WITH YOU AFTER SURGERY IN YOUR PRIVATE ROOM DURING VISITING HOURS ONLY!  YOU NEED TO HAVE A COVID 19 TEST ON___8/31/21____ @_______ , THIS TEST MUST BE DONE BEFORE SURGERY,  COVID TESTING SITE [address] [place] [address], IT IS ON THE RIGHT GOING OUT [address] APPROXIMATELY  2 MINUTES PAST [employer] ON THE RIGHT. ONCE YOUR COVID TEST IS COMPLETED,  PLEASE BEGIN THE QUARANTINE INSTRUCTIONS AS OUTLINED IN YOUR HANDOUT.                Erik Weber  09/29/2019   Your procedure is scheduled on:        10/10/19  Report to [facility] Main  Entrance   Report to admitting      100pm     Call this number if you have problems the morning of surgery [phone]   No solid foods after midnite May have clear liquids from 42mdnite until 1200noon.  Drink Ensure preop drink 3 hours prior to surgery which needs to be completed at 1200noon.   MP cjewing gum candy or mints after midnite.      CLEAR LIQUID DIET   Foods Allowed                                                                      Coffee and tea, regular and decaf                           Plain Jell-O any favor except red or purple                                         Fruit ices (not with fruit pulp)                                      Iced Popsicles                                     Carbonated beverages, regular and diet                                    Cranberry, grape and apple juices Sports drinks like Gatorade Lightly seasoned clear broth or consume(fat free) Sugar, honey syrup  _____________________________________________________________________      BRUSH YOUR TEETH MORNING OF SURGERY AND RINSE YOUR MOUTH OUT, NO CHEWING GUM CANDY OR MINTS.     Take these medicines the morning of surgery with A SIP OF WATER:  Amlodipine                                 You  may not have any metal on your body including hair pins and              piercings  Do not wear jewelry, make-up, lotions, powders or perfumes, deodorant             Do not wear nail polish on your fingernails.  Do not shave  48 hours prior to surgery.              Men may shave face and neck.   Do not bring valuables to the hospital. [place].  Contacts, dentures or bridgework may not be worn into surgery.  Leave suitcase in the car. After surgery it may be brought to your room.                 Please read over the following fact sheets you were given: _____________________________________________________________________  [facility] - Preparing for Surgery Before surgery, you can play an important role.  Because skin is not sterile, your skin needs to be as free of germs as possible.  You can reduce the number of germs on your skin by washing with CHG (chlorahexidine gluconate) soap before surgery.  CHG is an antiseptic cleaner which kills germs and bonds with the skin to continue killing germs even after washing. Please DO NOT use if you have an allergy to CHG or antibacterial soaps.  If your skin becomes reddened/irritated stop using the CHG and inform your nurse when you arrive at Short Stay. Do not shave (including legs and underarms) for at least 48 hours prior to the first CHG shower.  You may shave your face/neck. Please follow these instructions carefully:  1.  Shower with CHG Soap the night before surgery and the  morning of Surgery.  2.  If you choose to wash your hair, wash your hair first as usual with your  normal  shampoo.  3.  After you shampoo, rinse your hair and body thoroughly to remove the  shampoo.                           4.  Use CHG as you would any other liquid soap.  You can apply chg directly  to the skin and wash                       Gently with a scrungie or clean  washcloth.  5.  Apply the CHG Soap to your body ONLY FROM THE NECK DOWN.   Do not use on face/ open                           Wound or open sores. Avoid contact with eyes, ears mouth and genitals (private parts).                       Wash face,  Genitals (private parts) with your normal soap.             6.  Wash thoroughly, paying special attention to the area where your surgery  will be performed.  7.  Thoroughly rinse your body with warm water from the neck down.  8.  DO NOT shower/wash with your normal soap after using and rinsing off  the CHG Soap.  9.  Pat yourself dry with a clean towel.            10.  Wear clean pajamas.            11.  Place clean sheets on your bed the night of your first shower and do not  sleep with pets. Day of Surgery : Do not apply any lotions/deodorants the morning of surgery.  Please wear clean clothes to the hospital/surgery center.  FAILURE TO FOLLOW THESE INSTRUCTIONS MAY RESULT IN THE CANCELLATION OF YOUR SURGERY PATIENT SIGNATURE_________________________________  NURSE SIGNATURE__________________________________  ________________________________________________________________________

## 2019-10-01 ENCOUNTER — Encounter (HOSPITAL_COMMUNITY)
Admission: RE | Admit: 2019-10-01 | Discharge: 2019-10-01 | Disposition: A | Discharge status: Home / Self Care | Payer: BC Managed Care – PPO | Source: Ambulatory Visit | Attending: Family Medicine | Admitting: Family Medicine

## 2019-10-01 ENCOUNTER — Encounter (HOSPITAL_COMMUNITY): Payer: Self-pay

## 2019-10-01 ENCOUNTER — Other Ambulatory Visit: Payer: Self-pay

## 2019-10-01 ENCOUNTER — Encounter (HOSPITAL_COMMUNITY): Payer: Self-pay | Admitting: General Surgery

## 2019-10-08 ENCOUNTER — Encounter (HOSPITAL_COMMUNITY)
Admission: RE | Admit: 2019-10-08 | Discharge: 2019-10-08 | Disposition: A | Discharge status: Home / Self Care | Payer: BC Managed Care – PPO | Source: Ambulatory Visit | Attending: General Surgery | Admitting: General Surgery

## 2019-10-08 ENCOUNTER — Other Ambulatory Visit: Payer: Self-pay

## 2019-10-08 ENCOUNTER — Other Ambulatory Visit (HOSPITAL_COMMUNITY)
Admission: RE | Admit: 2019-10-08 | Discharge: 2019-10-08 | Disposition: A | Discharge status: Home / Self Care | Payer: BC Managed Care – PPO | Source: Ambulatory Visit | Attending: General Surgery | Admitting: General Surgery

## 2019-10-08 DIAGNOSIS — Z20822 Contact with and (suspected) exposure to covid-19: Secondary | ICD-10-CM | POA: Insufficient documentation

## 2019-10-08 DIAGNOSIS — Z01812 Encounter for preprocedural laboratory examination: Secondary | ICD-10-CM | POA: Insufficient documentation

## 2019-10-08 LAB — BASIC METABOLIC PANEL
Anion gap: 8 (ref 5–15)
BUN: 12 mg/dL (ref 6–20)
CO2: 27 mmol/L (ref 22–32)
Calcium: 9.2 mg/dL (ref 8.9–10.3)
Chloride: 106 mmol/L (ref 98–111)
Creatinine, Ser: 1.31 mg/dL — ABNORMAL HIGH (ref 0.61–1.24)
GFR calc Af Amer: >60 mL/min (ref >60)
GFR calc non Af Amer: >60 mL/min (ref >60)
Glucose, Bld: 82 mg/dL (ref 70–99)
Potassium: 4 mmol/L (ref 3.5–5.1)
Sodium: 141 mmol/L (ref 135–145)

## 2019-10-08 LAB — CBC
HCT: 43.9 % (ref 39.0–52.0)
Hemoglobin: 13.9 g/dL (ref 13.0–17.0)
MCH: 27.5 pg (ref 26.0–34.0)
MCHC: 31.7 g/dL (ref 30.0–36.0)
MCV: 86.9 fL (ref 80.0–100.0)
Platelets: 265 10*3/uL (ref 150–400)
RBC: 5.05 MIL/uL (ref 4.22–5.81)
RDW: 12.9 % (ref 11.5–15.5)
WBC: 7.8 10*3/uL (ref 4.0–10.5)
nRBC: 0 % (ref 0.0–0.2)

## 2019-10-08 LAB — SARS CORONAVIRUS 2 (TAT 6-24 HRS): SARS Coronavirus 2: NEGATIVE

## 2019-10-08 NOTE — Progress Notes (Signed)
DUE TO COVID-19 ONLY ONE VISITOR IS ALLOWED TO COME WITH YOU AND STAY IN THE WAITING ROOM ONLY DURING PRE OP AND PROCEDURE DAY OF SURGERY. THE 1 VISITOR  MAY VISIT WITH YOU AFTER SURGERY IN YOUR PRIVATE ROOM DURING VISITING HOURS ONLY!  YOU NEED TO HAVE A COVID 19 TEST ON_______ @_______ , THIS TEST MUST BE DONE BEFORE SURGERY,  COVID TESTING SITE [address] [place] [address], IT IS ON THE RIGHT GOING OUT [address] APPROXIMATELY  2 MINUTES PAST [employer] ON THE RIGHT. ONCE YOUR COVID TEST IS COMPLETED,  PLEASE BEGIN THE QUARANTINE INSTRUCTIONS AS OUTLINED IN YOUR HANDOUT.                Erik Weber  10/08/2019   Your procedure is scheduled on: 10/10/2019    Report to [facility] Main  Entrance   Report to admitting at    0930 AM     Call this number if you have problems the morning of surgery [phone]    REMEMBER: NO  SOLID FOOD CANDY OR GUM AFTER MIDNIGHT. CLEAR LIQUIDS UNTIL [id number]        . NOTHING BY MOUTH EXCEPT CLEAR LIQUIDS UNTIL    . PLEASE FINISH ENSURE DRINK PER SURGEON ORDER  WHICH NEEDS TO BE COMPLETED AT      .      CLEAR LIQUID DIET   Foods Allowed                                                                    Coffee and tea, regular and decaf                            Fruit ices (not with fruit pulp)                                      Iced Popsicles                                    Carbonated beverages, regular and diet                                    Cranberry, grape and apple juices Sports drinks like Gatorade Lightly seasoned clear broth or consume(fat free) Sugar, honey syrup ___________________________________________________________________      BRUSH YOUR TEETH MORNING OF SURGERY AND RINSE YOUR MOUTH OUT, NO CHEWING GUM CANDY OR MINTS.     Take these medicines the morning of surgery with A SIP OF WATER: Amlodipine   DO NOT TAKE ANY DIABETIC MEDICATIONS DAY OF YOUR SURGERY                                You may not have any metal on your body including hair pins and              piercings  Do not wear jewelry, make-up,  lotions, powders or perfumes, deodorant             Do not wear nail polish on your fingernails.  Do not shave  48 hours prior to surgery.              Men may shave face and neck.   Do not bring valuables to the hospital. [place].  Contacts, dentures or bridgework may not be worn into surgery.  Leave suitcase in the car. After surgery it may be brought to your room.     Patients discharged the day of surgery will not be allowed to drive home. IF YOU ARE HAVING SURGERY AND GOING HOME THE SAME DAY, YOU MUST HAVE AN ADULT TO DRIVE YOU HOME AND BE WITH YOU FOR 24 HOURS. YOU MAY GO HOME BY TAXI OR UBER OR ORTHERWISE, BUT AN ADULT MUST ACCOMPANY YOU HOME AND STAY WITH YOU FOR 24 HOURS.  Name and phone number of your driver:  Special Instructions: N/A              Please read over the following fact sheets you were given: _____________________________________________________________________  [facility] - Preparing for Surgery Before surgery, you can play an important role.  Because skin is not sterile, your skin needs to be as free of germs as possible.  You can reduce the number of germs on your skin by washing with CHG (chlorahexidine gluconate) soap before surgery.  CHG is an antiseptic cleaner which kills germs and bonds with the skin to continue killing germs even after washing. Please DO NOT use if you have an allergy to CHG or antibacterial soaps.  If your skin becomes reddened/irritated stop using the CHG and inform your nurse when you arrive at Short Stay. Do not shave (including legs and underarms) for at least 48 hours prior to the first CHG shower.  You may shave your face/neck. Please follow these instructions carefully:  1.  Shower with CHG Soap the night before surgery and the  morning of Surgery.  2.  If  you choose to wash your hair, wash your hair first as usual with your  normal  shampoo.  3.  After you shampoo, rinse your hair and body thoroughly to remove the  shampoo.                           4.  Use CHG as you would any other liquid soap.  You can apply chg directly  to the skin and wash                       Gently with a scrungie or clean washcloth.  5.  Apply the CHG Soap to your body ONLY FROM THE NECK DOWN.   Do not use on face/ open                           Wound or open sores. Avoid contact with eyes, ears mouth and genitals (private parts).                       Wash face,  Genitals (private parts) with your normal soap.             6.  Wash thoroughly, paying special attention to  the area where your surgery  will be performed.  7.  Thoroughly rinse your body with warm water from the neck down.  8.  DO NOT shower/wash with your normal soap after using and rinsing off  the CHG Soap.                9.  Pat yourself dry with a clean towel.            10.  Wear clean pajamas.            11.  Place clean sheets on your bed the night of your first shower and do not  sleep with pets. Day of Surgery : Do not apply any lotions/deodorants the morning of surgery.  Please wear clean clothes to the hospital/surgery center.  FAILURE TO FOLLOW THESE INSTRUCTIONS MAY RESULT IN THE CANCELLATION OF YOUR SURGERY PATIENT SIGNATURE_________________________________  NURSE SIGNATURE__________________________________  ________________________________________________________________________

## 2019-10-08 NOTE — Progress Notes (Signed)
BMP done 10/08/19 faxed via epic to Dr Greer Pickerel.,

## 2019-10-09 NOTE — Progress Notes (Deleted)
[facility] [address], [place], [place] [address] [phone]  Date:  10/15/2019   Name:  Erik Weber   DOB:  March 05, 1975   MRN:  [id number]  PCP:  Darreld Mclean, MD    Chief Complaint: No chief complaint on file.   History of Present Illness:  Erik Weber is a 44 y.o. very pleasant male patient who presents with the following:  Here today for a CPE Last seen by myself last October when he had recently completed gastric bypass surgery History of OSA, obesity, HTN, hypogonadism, GERD  Labs, hep C screening due covid done Colon cancer screening   Patient Active Problem List   Diagnosis Date Noted  . CKD (chronic kidney disease) stage 3, GFR 30-59 ml/min 11/25/2018  . OSA (obstructive sleep apnea) 10/28/2018  . 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 Obesity 08/18/2013  . Tinea versicolor 05/19/2013  . Hypertriglyceridemia 05/28/2012  . Hypogonadism male 06/07/2011  . Essential hypertension 09/21/2009  . 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 GERD 04/04/2007    Past Medical History:  Diagnosis Date  . Chronic renal insufficiency   . Fracture of base of fifth metacarpal bone of right hand   . GERD (gastroesophageal reflux disease)    hx of   . Hypertension   . Morbid obesity ([place])   . Pneumonitis 10/04/2018   after accidental inhalation of swimming pool water during swimmng lesson  . 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 Sleep apnea    Mild, no cpap needed    Past Surgical History:  Procedure Laterality Date  . ADENOIDECTOMY     childhood  . 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 HAND SURGERY Right 02/2016  . UPPER GI ENDOSCOPY  03/10/2016    Social History   Tobacco Use  . Smoking status: Never Smoker  .  Smokeless tobacco: Never Used  Vaping Use  . Vaping Use: Never used  Substance Use Topics  . Alcohol use: No    Alcohol/week: 0.0 standard drinks  . Drug use: No    Family History  Problem Relation Age of Onset  . Kidney cancer Mother 75  . Hypertension Father        age 14  . Diabetes Father   . Lung cancer Paternal Uncle   . Colon cancer Neg Hx   . Prostate cancer Neg Hx   . Liver disease Neg Hx   . Esophageal cancer Neg Hx   . Stomach cancer Neg Hx   . Pancreatic cancer Neg Hx     No Known Allergies  Medication list has been reviewed and updated.  Current Outpatient Medications on File Prior to Visit  Medication Sig Dispense Refill  . amLODipine (NORVASC) 5 MG tablet Take 1 tablet (5 mg total) by mouth daily. (Patient taking differently: Take 2.5 mg by mouth daily. ) 90 tablet 3  . cholecalciferol (VITAMIN D3) 25 MCG (1000 UNIT) tablet Take 1,000 Units by mouth daily.    . ferrous sulfate 325 (65 FE) MG tablet Take 325 mg by mouth daily with breakfast.    . Multiple Vitamins-Minerals (MULTIVITAMIN WITH MINERALS) tablet Take 1 tablet by mouth daily.     No current facility-administered medications on file prior to  visit.    Review of Systems:  As per HPI- otherwise negative.   Physical Examination: There were no vitals filed for this visit. There were no vitals filed for this visit. There is no height or weight on file to calculate BMI. Ideal Body Weight:    GEN: no acute distress. HEENT: Atraumatic, Normocephalic.  Ears and Nose: No external deformity. CV: RRR, No M/G/R. No JVD. No thrill. No extra heart sounds. PULM: CTA B, no wheezes, crackles, rhonchi. No retractions. No resp. distress. No accessory muscle use. ABD: S, NT, ND, +BS. No rebound. No HSM. EXTR: No c/c/e PSYCH: Normally interactive. Conversant.    Assessment and Plan: *** This visit occurred during the SARS-CoV-2 public health emergency.  Safety protocols were in place, including  screening questions prior to the visit, additional usage of staff PPE, and extensive cleaning of exam room while observing appropriate contact time as indicated for disinfecting solutions.    Signed Lamar Blinks, MD

## 2019-10-10 ENCOUNTER — Ambulatory Visit (HOSPITAL_COMMUNITY)
Admission: RE | Admit: 2019-10-10 | Discharge: 2019-10-10 | Disposition: A | Discharge status: Home / Self Care | Payer: BC Managed Care – PPO | Attending: General Surgery | Admitting: General Surgery

## 2019-10-10 ENCOUNTER — Encounter (HOSPITAL_COMMUNITY)
Admission: RE | Disposition: A | Discharge status: Home / Self Care | Payer: Self-pay | Source: Home / Self Care | Attending: General Surgery

## 2019-10-10 ENCOUNTER — Ambulatory Visit (HOSPITAL_COMMUNITY): Payer: BC Managed Care – PPO | Admitting: Anesthesiology

## 2019-10-10 ENCOUNTER — Encounter (HOSPITAL_COMMUNITY): Payer: Self-pay | Admitting: General Surgery

## 2019-10-10 DIAGNOSIS — Z8249 Family history of ischemic heart disease and other diseases of the circulatory system: Secondary | ICD-10-CM | POA: Diagnosis not present

## 2019-10-10 DIAGNOSIS — Z801 Family history of malignant neoplasm of trachea, bronchus and lung: Secondary | ICD-10-CM | POA: Diagnosis not present

## 2019-10-10 DIAGNOSIS — I10 Essential (primary) hypertension: Secondary | ICD-10-CM | POA: Insufficient documentation

## 2019-10-10 DIAGNOSIS — K219 Gastro-esophageal reflux disease without esophagitis: Secondary | ICD-10-CM | POA: Insufficient documentation

## 2019-10-10 DIAGNOSIS — I129 Hypertensive chronic kidney disease with stage 1 through stage 4 chronic kidney disease, or unspecified chronic kidney disease: Secondary | ICD-10-CM | POA: Diagnosis not present

## 2019-10-10 DIAGNOSIS — K4090 Unilateral inguinal hernia, without obstruction or gangrene, not specified as recurrent: Secondary | ICD-10-CM | POA: Insufficient documentation

## 2019-10-10 DIAGNOSIS — N183 Chronic kidney disease, stage 3 unspecified: Secondary | ICD-10-CM | POA: Diagnosis not present

## 2019-10-10 DIAGNOSIS — Z833 Family history of diabetes mellitus: Secondary | ICD-10-CM | POA: Diagnosis not present

## 2019-10-10 DIAGNOSIS — G4730 Sleep apnea, unspecified: Secondary | ICD-10-CM | POA: Diagnosis not present

## 2019-10-10 DIAGNOSIS — N189 Chronic kidney disease, unspecified: Secondary | ICD-10-CM | POA: Diagnosis not present

## 2019-10-10 DIAGNOSIS — Z9884 Bariatric surgery status: Secondary | ICD-10-CM | POA: Insufficient documentation

## 2019-10-10 DIAGNOSIS — Z8051 Family history of malignant neoplasm of kidney: Secondary | ICD-10-CM | POA: Diagnosis not present

## 2019-10-10 DIAGNOSIS — Z6827 Body mass index (BMI) 27.0-27.9, adult: Secondary | ICD-10-CM | POA: Diagnosis not present

## 2019-10-10 HISTORY — PX: XI ROBOTIC ASSISTED INGUINAL HERNIA REPAIR WITH MESH: SHX6706

## 2019-10-10 SURGERY — REPAIR, HERNIA, INGUINAL, ROBOT-ASSISTED, LAPAROSCOPIC, USING MESH
Anesthesia: General | Site: Abdomen | Laterality: Left

## 2019-10-10 MED ORDER — ACETAMINOPHEN 500 MG PO TABS
1000.0000 mg | ORAL_TABLET | ORAL | Status: AC
  Administered 2019-10-10: 1000 mg via ORAL
  Filled 2019-10-10: qty 2

## 2019-10-10 MED ORDER — ACETAMINOPHEN 500 MG PO TABS: 1000.0000 mg | ORAL_TABLET | Freq: Three times a day (TID) | ORAL | 0 refills | Status: AC

## 2019-10-10 MED ORDER — ONDANSETRON HCL 4 MG/2ML IJ SOLN
INTRAMUSCULAR | Status: AC
  Filled 2019-10-10: qty 2

## 2019-10-10 MED ORDER — LIDOCAINE 2% (20 MG/ML) 5 ML SYRINGE: INTRAMUSCULAR | Status: DC | PRN

## 2019-10-10 MED ORDER — FENTANYL CITRATE (PF) 100 MCG/2ML IJ SOLN
INTRAMUSCULAR | Status: DC | PRN
  Administered 2019-10-10 (×3): 50 ug via INTRAVENOUS

## 2019-10-10 MED ORDER — LIDOCAINE 2% (20 MG/ML) 5 ML SYRINGE
INTRAMUSCULAR | Status: DC | PRN
  Administered 2019-10-10: 50 mg via INTRAVENOUS
  Administered 2019-10-10: 30 mg via INTRAVENOUS

## 2019-10-10 MED ORDER — LACTATED RINGERS IV SOLN: INTRAVENOUS | Status: DC

## 2019-10-10 MED ORDER — CHLORHEXIDINE GLUCONATE 0.12 % MT SOLN
15.0000 mL | Freq: Once | OROMUCOSAL | Status: AC
  Administered 2019-10-10: 15 mL via OROMUCOSAL

## 2019-10-10 MED ORDER — TRAMADOL HCL 50 MG PO TABS: 50.0000 mg | ORAL_TABLET | Freq: Four times a day (QID) | ORAL | 0 refills | Status: DC | PRN

## 2019-10-10 MED ORDER — LIDOCAINE 2% (20 MG/ML) 5 ML SYRINGE
INTRAMUSCULAR | Status: AC
  Filled 2019-10-10: qty 5

## 2019-10-10 MED ORDER — GABAPENTIN 300 MG PO CAPS
300.0000 mg | ORAL_CAPSULE | ORAL | Status: AC
  Administered 2019-10-10: 300 mg via ORAL
  Filled 2019-10-10: qty 1

## 2019-10-10 MED ORDER — 0.9 % SODIUM CHLORIDE (POUR BTL) OPTIME
TOPICAL | Status: DC | PRN
  Administered 2019-10-10: 1000 mL

## 2019-10-10 MED ORDER — ORAL CARE MOUTH RINSE: 15.0000 mL | Freq: Once | OROMUCOSAL | Status: AC

## 2019-10-10 MED ORDER — CHLORHEXIDINE GLUCONATE CLOTH 2 % EX PADS: 6.0000 | MEDICATED_PAD | Freq: Once | CUTANEOUS | Status: DC

## 2019-10-10 MED ORDER — BUPIVACAINE-EPINEPHRINE (PF) 0.25% -1:200000 IJ SOLN
INTRAMUSCULAR | Status: DC | PRN
  Administered 2019-10-10: 30 mL

## 2019-10-10 MED ORDER — DEXAMETHASONE SODIUM PHOSPHATE 10 MG/ML IJ SOLN
INTRAMUSCULAR | Status: DC | PRN
  Administered 2019-10-10: 8 mg via INTRAVENOUS

## 2019-10-10 MED ORDER — MIDAZOLAM HCL 2 MG/2ML IJ SOLN
INTRAMUSCULAR | Status: AC
  Filled 2019-10-10: qty 2

## 2019-10-10 MED ORDER — PROPOFOL 10 MG/ML IV BOLUS
INTRAVENOUS | Status: AC
  Filled 2019-10-10: qty 20

## 2019-10-10 MED ORDER — BUPIVACAINE LIPOSOME 1.3 % IJ SUSP
20.0000 mL | Freq: Once | INTRAMUSCULAR | Status: AC
  Administered 2019-10-10: 20 mL
  Filled 2019-10-10: qty 20

## 2019-10-10 MED ORDER — ONDANSETRON HCL 4 MG/2ML IJ SOLN
INTRAMUSCULAR | Status: DC | PRN
  Administered 2019-10-10: 4 mg via INTRAVENOUS

## 2019-10-10 MED ORDER — FENTANYL CITRATE (PF) 100 MCG/2ML IJ SOLN
INTRAMUSCULAR | Status: AC
  Filled 2019-10-10: qty 2

## 2019-10-10 MED ORDER — CEFAZOLIN SODIUM-DEXTROSE 2-4 GM/100ML-% IV SOLN
2.0000 g | INTRAVENOUS | Status: AC
  Administered 2019-10-10: 2 g via INTRAVENOUS
  Filled 2019-10-10: qty 100

## 2019-10-10 MED ORDER — SUGAMMADEX SODIUM 200 MG/2ML IV SOLN
INTRAVENOUS | Status: DC | PRN
  Administered 2019-10-10: 200 mg via INTRAVENOUS

## 2019-10-10 MED ORDER — MIDAZOLAM HCL 2 MG/2ML IJ SOLN
INTRAMUSCULAR | Status: DC | PRN
  Administered 2019-10-10: 2 mg via INTRAVENOUS

## 2019-10-10 MED ORDER — ROCURONIUM BROMIDE 10 MG/ML (PF) SYRINGE
PREFILLED_SYRINGE | INTRAVENOUS | Status: AC
  Filled 2019-10-10: qty 10

## 2019-10-10 MED ORDER — FENTANYL CITRATE (PF) 100 MCG/2ML IJ SOLN: 25.0000 ug | INTRAMUSCULAR | Status: DC | PRN

## 2019-10-10 MED ORDER — ROCURONIUM BROMIDE 10 MG/ML (PF) SYRINGE
PREFILLED_SYRINGE | INTRAVENOUS | Status: DC | PRN
  Administered 2019-10-10: 100 mg via INTRAVENOUS

## 2019-10-10 MED ORDER — PROPOFOL 10 MG/ML IV BOLUS
INTRAVENOUS | Status: DC | PRN
  Administered 2019-10-10: 150 mg via INTRAVENOUS

## 2019-10-10 MED ORDER — BUPIVACAINE-EPINEPHRINE (PF) 0.25% -1:200000 IJ SOLN
INTRAMUSCULAR | Status: AC
  Filled 2019-10-10: qty 30

## 2019-10-10 MED ORDER — DEXAMETHASONE SODIUM PHOSPHATE 10 MG/ML IJ SOLN
INTRAMUSCULAR | Status: AC
  Filled 2019-10-10: qty 1

## 2019-10-10 MED ORDER — HYDRALAZINE HCL 20 MG/ML IJ SOLN
5.0000 mg | Freq: Once | INTRAMUSCULAR | Status: AC
  Administered 2019-10-10: 5 mg via INTRAVENOUS

## 2019-10-10 MED ORDER — HYDRALAZINE HCL 20 MG/ML IJ SOLN
INTRAMUSCULAR | Status: AC
  Filled 2019-10-10: qty 1

## 2019-10-10 SURGICAL SUPPLY — 55 items
APL PRP STRL LF DISP 70% ISPRP (MISCELLANEOUS) ×1
BLADE SURG SZ11 CARB STEEL (BLADE) ×2 IMPLANT
CANNULA REDUC XI 12-8 STAPL (CANNULA)
CANNULA REDUCER 12-8 DVNC XI (CANNULA) IMPLANT
CHLORAPREP W/TINT 26 (MISCELLANEOUS) ×2 IMPLANT
CLSR STERI-STRIP ANTIMIC 1/2X4 (GAUZE/BANDAGES/DRESSINGS) ×2 IMPLANT
COVER SURGICAL LIGHT HANDLE (MISCELLANEOUS) ×2 IMPLANT
COVER TIP SHEARS 8 DVNC (MISCELLANEOUS) IMPLANT
COVER TIP SHEARS 8MM DA VINCI (MISCELLANEOUS)
COVER WAND RF STERILE (DRAPES) IMPLANT
DECANTER SPIKE VIAL GLASS SM (MISCELLANEOUS) ×2 IMPLANT
DRAPE ARM DVNC X/XI (DISPOSABLE) ×4 IMPLANT
DRAPE COLUMN DVNC XI (DISPOSABLE) ×1 IMPLANT
DRAPE DA VINCI XI ARM (DISPOSABLE) ×8
DRAPE DA VINCI XI COLUMN (DISPOSABLE) ×2
DRSG TEGADERM 2-3/8X2-3/4 SM (GAUZE/BANDAGES/DRESSINGS) ×4 IMPLANT
ELECT REM PT RETURN 15FT ADLT (MISCELLANEOUS) ×2 IMPLANT
GAUZE SPONGE 2X2 8PLY STRL LF (GAUZE/BANDAGES/DRESSINGS) ×1 IMPLANT
GLOVE BIO SURGEON STRL SZ7.5 (GLOVE) ×4 IMPLANT
GLOVE INDICATOR 8.0 STRL GRN (GLOVE) ×4 IMPLANT
GOWN STRL REUS W/TWL XL LVL3 (GOWN DISPOSABLE) ×4 IMPLANT
GRASPER SUT TROCAR 14GX15 (MISCELLANEOUS) IMPLANT
KIT BASIN OR (CUSTOM PROCEDURE TRAY) ×2 IMPLANT
KIT TURNOVER KIT A (KITS) IMPLANT
MESH 3DMAX 4X6 LT LRG (Mesh General) ×1 IMPLANT
NDL INSUFFLATION 14GA 120MM (NEEDLE) IMPLANT
NEEDLE HYPO 22GX1.5 SAFETY (NEEDLE) ×2 IMPLANT
NEEDLE INSUFFLATION 14GA 120MM (NEEDLE) IMPLANT
PACK CARDIOVASCULAR III (CUSTOM PROCEDURE TRAY) ×2 IMPLANT
PAD POSITIONING PINK XL (MISCELLANEOUS) ×2 IMPLANT
SCISSORS LAP 5X35 DISP (ENDOMECHANICALS) IMPLANT
SEAL CANN UNIV 5-8 DVNC XI (MISCELLANEOUS) ×3 IMPLANT
SEAL XI 5MM-8MM UNIVERSAL (MISCELLANEOUS) ×6
SET IRRIG TUBING LAPAROSCOPIC (IRRIGATION / IRRIGATOR) IMPLANT
SOL ANTI FOG 6CC (MISCELLANEOUS) ×1 IMPLANT
SOLUTION ANTI FOG 6CC (MISCELLANEOUS) ×1
SOLUTION ELECTROLUBE (MISCELLANEOUS) ×2 IMPLANT
SPONGE GAUZE 2X2 STER 10/PKG (GAUZE/BANDAGES/DRESSINGS) ×1
SPONGE LAP 18X18 RF (DISPOSABLE) ×2 IMPLANT
STAPLER CANNULA SEAL DVNC XI (STAPLE) IMPLANT
STAPLER CANNULA SEAL XI (STAPLE)
STRIP CLOSURE SKIN 1/2X4 (GAUZE/BANDAGES/DRESSINGS) ×2 IMPLANT
SUT MNCRL AB 4-0 PS2 18 (SUTURE) ×2 IMPLANT
SUT VIC AB 3-0 SH 27 (SUTURE) ×4
SUT VIC AB 3-0 SH 27XBRD (SUTURE) ×2 IMPLANT
SUT VICRYL 0 UR6 27IN ABS (SUTURE) ×2 IMPLANT
SUT VLOC 180 2-0 6IN GS21 (SUTURE) IMPLANT
SUT VLOC 180 2-0 9IN GS21 (SUTURE) IMPLANT
SUT VLOC 180 3-0 9IN GS21 (SUTURE) ×2 IMPLANT
SYR 10ML ECCENTRIC (SYRINGE) IMPLANT
SYR 20ML LL LF (SYRINGE) ×2 IMPLANT
TOWEL OR 17X26 10 PK STRL BLUE (TOWEL DISPOSABLE) ×2 IMPLANT
TOWEL OR NON WOVEN STRL DISP B (DISPOSABLE) ×2 IMPLANT
TROCAR BLADELESS OPT 5 100 (ENDOMECHANICALS) IMPLANT
TUBING INSUFFLATION 10FT LAP (TUBING) ×2 IMPLANT

## 2019-10-10 NOTE — H&P (Signed)
CC: here for surgery  Requesting provider: n/a  HPI: Erik Weber is an 44 y.o. male who is here for robotic repair of Left inguinal hernia repair with mesh. Denies medical changes since seen in clinic.   The patient is a 44 year old male presenting status-post bariatric surgery. He comes in today to discuss a bulge in his groin. His PCP sent him. He underwent gastric bypass surgery about 11 months ago. He states he is moving some furniture and he developed some discomfort in his lower abdomen. He initially thought it was some gas and took a laxative without improvement. He then noticed a bulge in his left groin. It is very painful. He is saw his PCP and was able to push it back in. He states it is not as prominent as it was. It is not as painful as it was either. He describes as an aching sensation. He denies any burning, shooting or stabbing pain. He has a bowel movement daily. He is taking his multivitamin calcium. He denies any heartburn or reflux. He denies any epigastric pain. He does have some occasional dizziness when walking. He is wondering if it's his blood pressure. He is still on a very low-dose of Norvasc. I reviewed the PCP note  Past Medical History:  Diagnosis Date  . Chronic renal insufficiency   . Fracture of base of fifth metacarpal bone of right hand   . GERD (gastroesophageal reflux disease)    hx of   . Hypertension   . Morbid obesity ([place])   . Pneumonitis 10/04/2018   after accidental inhalation of swimming pool water during swimmng lesson  . 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 Sleep apnea    Mild, no cpap needed    Past Surgical History:  Procedure Laterality Date  . ADENOIDECTOMY     childhood  . 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 HAND SURGERY Right 02/2016  . UPPER GI  ENDOSCOPY  03/10/2016    Family History  Problem Relation Age of Onset  . Kidney cancer Mother 61  . Hypertension Father        age 70  . Diabetes Father   . Lung cancer Paternal Uncle   . Colon cancer Neg Hx   . Prostate cancer Neg Hx   . Liver disease Neg Hx   . Esophageal cancer Neg Hx   . Stomach cancer Neg Hx   . Pancreatic cancer Neg Hx     Social:  reports that he has never smoked. He has never used smokeless tobacco. He reports that he does not drink alcohol and does not use drugs.  Allergies: No Known Allergies  Medications: reviewed  No results found for this or any previous visit (from the past 48 hour(s)).  No results found.  ROS - all of the below systems have been reviewed with the patient and positives are indicated with bold text General: chills, fever or night sweats Eyes: blurry vision or double vision ENT: epistaxis or sore throat Allergy/Immunology: itchy/watery eyes or nasal congestion Hematologic/Lymphatic: bleeding problems, blood clots or swollen lymph nodes Endocrine: temperature intolerance or unexpected weight changes Breast: new or changing breast lumps or nipple discharge Resp: cough, shortness of breath, or wheezing CV: chest pain or dyspnea on exertion GI: as per HPI GU: dysuria, trouble voiding, or  hematuria MSK: joint pain or joint stiffness Neuro: TIA or stroke symptoms Derm: pruritus and skin lesion changes Psych: anxiety and depression  PE Blood pressure (!) 137/91, pulse (!) 42, temperature 98 F (36.7 C), temperature source Oral, weight 91.9 kg, SpO2 100 %. Constitutional: NAD; conversant; no deformities Eyes: Moist conjunctiva; no lid lag; anicteric; PERRL Neck: Trachea midline; no thyromegaly Lungs: Normal respiratory effort; no tactile fremitus CV: RRR; no palpable thrills; no pitting edema GI: Abd soft, nt, L groin bulge; no palpable hepatosplenomegaly MSK: Normal gait; no clubbing/cyanosis Psychiatric: Appropriate  affect; alert and oriented x3 Lymphatic: No palpable cervical or axillary lymphadenopathy  No results found for this or any previous visit (from the past 48 hour(s)).  No results found.  Imaging:   A/P: Erik Weber is an 44 y.o. male with left inguinal hernia; History of gastric bypass  2 OR for robotic repair of left inguinal hernia with mesh IV antibiotic Enhanced recovery protocols All questions asked and answered  [name]. [name], MD, FACS General, Bariatric, & Minimally Invasive Surgery [facility], [place]

## 2019-10-10 NOTE — Discharge Instructions (Signed)
[facility] LAPAROSCOPIC [facility]: POST OP INSTRUCTIONS Always review your discharge instruction sheet given to you by the facility where your surgery was performed. IF YOU HAVE DISABILITY OR FAMILY LEAVE FORMS, YOU MUST BRING THEM TO THE OFFICE FOR PROCESSING.   DO NOT GIVE THEM TO YOUR DOCTOR.  PAIN CONTROL  1. First take acetaminophen (Tylenol) to control your pain after surgery.  Follow directions on package.  Taking acetaminophen (Tylenol)) regularly after surgery will help to control your pain and lower the amount of prescription pain medication you may need.  You should not take more than 3,000 mg (3 grams) of acetaminophen (Tylenol) in 24 hours.  You should not take ibuprofen (Advil), aleve, motrin, naprosyn or other NSAIDS if you have a history of stomach ulcers or chronic kidney disease.  2. A prescription for pain medication may be given to you upon discharge.  Take your pain medication as prescribed, if you still have uncontrolled pain after taking acetaminophen (Tylenol). 3. Use ice packs to help control pain. 4. If you need a refill on your pain medication, please contact your pharmacy.  They will contact our office to request authorization. Prescriptions will not be filled after 5pm or on week-ends.  HOME MEDICATIONS 5. Take your usually prescribed medications unless otherwise directed.  DIET 6. You should follow a light diet the first few days after arrival home.  Be sure to include lots of fluids daily. Avoid fatty, fried foods.   CONSTIPATION 7. It is common to experience some constipation after surgery and if you are taking pain medication.  Increasing fluid intake and taking a stool softener (such as Colace) will usually help or prevent this problem from occurring.  A mild laxative (Milk of Magnesia or Miralax) should be taken according to package instructions if there are no bowel movements after 48 hours.  WOUND/INCISION CARE 8. Most patients will  experience some swelling and bruising in the area of the incisions.  Ice packs will help.  Swelling and bruising can take several days to resolve.  9. Unless discharge instructions indicate otherwise, follow guidelines below  a. STERI-STRIPS - you may remove your outer bandages 48 hours after surgery, and you may shower at that time.  You have steri-strips (small skin tapes) in place directly over the incision.  These strips should be left on the skin for 7-10 days.   b. DERMABOND/SKIN GLUE - you may shower in 24 hours.  The glue will flake off over the next 2-3 weeks. 10. Any sutures or staples will be removed at the office during your follow-up visit.  ACTIVITIES 11. You may resume regular (light) daily activities beginning the next day--such as daily self-care, walking, climbing stairs--gradually increasing activities as tolerated.  You may have sexual intercourse when it is comfortable.  Refrain from any heavy lifting or straining until approved by your doctor. a. You may drive when you are no longer taking prescription pain medication, you can comfortably wear a seatbelt, and you can safely maneuver your car and apply brakes.  FOLLOW-UP 12. You should see your doctor in the office for a follow-up appointment approximately 2-3 weeks after your surgery.  You should have been given your post-op/follow-up appointment when your surgery was scheduled.  If you did not receive a post-op/follow-up appointment, make sure that you call for this appointment within a day or two after you arrive home to insure a convenient appointment time.  OTHER INSTRUCTIONS 13. DO NOT LIFT/PUSH/PULL ANYTHING GREATER THAN 10  LBS FOR 4 WEEKS  WHEN TO CALL YOUR DOCTOR: 1. Fever over 101.0 2. Inability to urinate 3. Continued bleeding from incision. 4. Increased pain, redness, or drainage from the incision. 5. Increasing abdominal pain  The clinic staff is available to answer your questions during regular business  hours.  Please don't hesitate to call and ask to speak to one of the nurses for clinical concerns.  If you have a medical emergency, go to the nearest emergency room or call 911.  A surgeon from [facility] is always on call at the hospital. [address], [place], [place], [place]  [address] ? [address] [place], [place], [place]   [address] [phone] ? [phone] ? FAX [phone] Web site: [email or web address]   Per Dr Lanetta Inch, anesthesiology, reports  patient experienced bigeminy intra op with stable blood pressure. Pt had bigeminy post-op and asymptomatic.  Pt reported to MD that he is experiencing episodes of dizziness and light headedness at home.  Patient aware to follow up with PCP or cardiology.

## 2019-10-10 NOTE — Transfer of Care (Signed)
Immediate Anesthesia Transfer of Care Note  Patient: Erik Weber  Procedure(s) Performed: XI ROBOTIC ASSISTED LEFT INGUINAL HERNIA REPAIR WITH MESH (Left Abdomen)  Patient Location: PACU  Anesthesia Type:General  Level of Consciousness: awake  Airway & Oxygen Therapy: Patient Spontanous Breathing and Patient connected to face mask oxygen  Post-op Assessment: Report given to RN, Post -op Vital signs reviewed and stable and Patient moving all extremities X 4  Post vital signs: Reviewed and stable  Last Vitals:  Vitals Value Taken Time  BP 157/111 10/10/19 1502  Temp    Pulse 66 10/10/19 1503  Resp 17 10/10/19 1503  SpO2 100 % 10/10/19 1503  Vitals shown include unvalidated device data.  Last Pain:  Vitals:   10/10/19 1031  TempSrc:   PainSc: 0-No pain         Complications: No complications documented.

## 2019-10-10 NOTE — Anesthesia Postprocedure Evaluation (Signed)
Anesthesia Post Note  Patient: Erik Weber  Procedure(s) Performed: XI ROBOTIC ASSISTED LEFT INGUINAL HERNIA REPAIR WITH MESH (Left Abdomen)     Patient location during evaluation: PACU Anesthesia Type: General Level of consciousness: awake and alert Pain management: pain level controlled Vital Signs Assessment: post-procedure vital signs reviewed and stable Respiratory status: spontaneous breathing, nonlabored ventilation, respiratory function stable and patient connected to nasal cannula oxygen Cardiovascular status: blood pressure returned to baseline and stable Postop Assessment: no apparent nausea or vomiting Anesthetic complications: no   No complications documented.  Last Vitals:  Vitals:   10/10/19 1630 10/10/19 1645  BP: (!) 145/104 (!) 143/100  Pulse: (!) 58   Resp: 17 20  Temp:  36.4 C  SpO2: 100% 99%    Last Pain:  Vitals:   10/10/19 1645  TempSrc:   PainSc: 2                  [name] L Saphia Vanderford

## 2019-10-10 NOTE — Anesthesia Preprocedure Evaluation (Addendum)
Anesthesia Evaluation  Patient identified by MRN, date of birth, ID band Patient awake    Reviewed: Allergy & Precautions, NPO status , Patient's Chart, lab work & pertinent test results  Airway Mallampati: I  TM Distance: >3 FB Neck ROM: Full    Dental no notable dental hx. (+) Chipped, Dental Advisory Given,    Pulmonary sleep apnea (no CPAP) ,    Pulmonary exam normal breath sounds clear to auscultation       Cardiovascular hypertension, Pt. on medications negative cardio ROS Normal cardiovascular exam Rhythm:Regular Rate:Normal     Neuro/Psych negative neurological ROS  negative psych ROS   GI/Hepatic Neg liver ROS, GERD  Controlled,  Endo/Other  negative endocrine ROS  Renal/GU Renal InsufficiencyRenal disease (Cr 1.31, K 4)  negative genitourinary   Musculoskeletal negative musculoskeletal ROS (+)   Abdominal   Peds  Hematology negative hematology ROS (+)   Anesthesia Other Findings   Reproductive/Obstetrics                           Anesthesia Physical Anesthesia Plan  ASA: III  Anesthesia Plan: General   Post-op Pain Management:    Induction: Intravenous  PONV Risk Score and Plan: 2 and Midazolam, Dexamethasone and Ondansetron  Airway Management Planned: Oral ETT  Additional Equipment:   Intra-op Plan:   Post-operative Plan: Extubation in OR  Informed Consent: I have reviewed the patients History and Physical, chart, labs and discussed the procedure including the risks, benefits and alternatives for the proposed anesthesia with the patient or authorized representative who has indicated his/her understanding and acceptance.     Dental advisory given  Plan Discussed with: CRNA  Anesthesia Plan Comments:         Anesthesia Quick Evaluation

## 2019-10-10 NOTE — Anesthesia Procedure Notes (Signed)
Procedure Name: Intubation Date/Time: 10/10/2019 12:53 PM Performed by: Freddrick March, MD Pre-anesthesia Checklist: Patient identified, Suction available, Emergency Drugs available and Patient being monitored Patient Re-evaluated:Patient Re-evaluated prior to induction Oxygen Delivery Method: Circle system utilized Preoxygenation: Pre-oxygenation with 100% oxygen Induction Type: IV induction Ventilation: Mask ventilation without difficulty and Oral airway inserted - appropriate to patient size Laryngoscope Size: Mac and 4 Grade View: Grade I Tube type: Oral Tube size: 7.5 mm Number of attempts: 1 Airway Equipment and Method: Stylet Placement Confirmation: ETT inserted through vocal cords under direct vision,  positive ETCO2 and breath sounds checked- equal and bilateral Secured at: 23 cm Tube secured with: Tape Dental Injury: Teeth and Oropharynx as per pre-operative assessment

## 2019-10-10 NOTE — Op Note (Signed)
PREOPERATIVE DIAGNOSIS: left inguinal hernia.    POSTOPERATIVE DIAGNOSIS: left  indirect inguinal hernia   PROCEDURE: Robotic/XI repair of Left indirect inguinal hernia with  mesh (rTAPP).  Bilateral laparoscopic tap block   SURGEON: [name]. [name], MD    ASSISTANT SURGEON: None.    ANESTHESIA: General plus local consisting of 0.25% Marcaine with epi.    ESTIMATED BLOOD LOSS: Minimal.    FINDINGS: The patient had a left  indirect hernia.  It was repaired using Bard 3dmax large left mesh    SPECIMEN: cord lipoma - discarded   INDICATIONS FOR PROCEDURE: 44yo presented for repair of a symptomatic inguinal hernia. The risks and benefits including but not limited to bleeding, infection, chronic inguinal pain, nerve entrapment, hernia recurrence, mesh complications, hematoma formation, urinary retention, injury to the testicles or the ovaries, numbness in the groin, blood clots, injury to the surrounding structures, and anesthesia risk was discussed with the patient.   DESCRIPTION OF PROCEDURE: After confirming the operative side with the patient in the preop area and marking it and obtaining consent the patient was then taken back to the operating room, placed  supine on the operating room table. General endotracheal anesthesia was  established. The patient had emptied their bladder prior to going back to  the operating room. Sequential compression devices were placed. The  abdomen and groin were prepped and draped in the usual standard surgical  fashion with ChloraPrep. The patient received oral Tylenol/gabapentin preoperatively as well as IV  antibiotics prior to the incision. A surgical time-out was performed.  Local was infiltrated at the base of the umbilicus.    Next, a 1-cm vertical supraumbilical incision was made with a #11 blade. The fascia  was grasped and lifted anteriorly. Next, the fascia was incised, and  the abdominal cavity was entered. Pursestring suture was placed  around  the fascial edges using a 0 Vicryl. A 12-mm Hasson trocar was placed.  Pneumoperitoneum was smoothly established up to a patient pressure of 15  mmHg. Robotic Laparoscope was advanced.  There was no evidence of injury to surrounding structures.  Patient had a left indirect inguinal hernia.  Unfortunately even though the patient had emptied his bladder within 15 minutes of coming back to the operating room he still had a very distended bladder. a laparoscopic tap block was performed.  The circulating nurse was able to go underneath the surgical drapes and placed a Foley catheter to decompress the bladder.  This was done in a sterile fashion and did not violate the sterile surgical field.  This time the bladder was completely decompressed.  We could see the Foley balloon.     Two additional 62m robotic trochars were placed one on patient's left and one on patient's right in the midclavicular line about 2 cm above the supraumbilical trocar.  A large piece of large left Bard 3D max mesh were placed through the [facility] into the abdominal cavity.  I went ahead and placed a 3-0 Vicryl suture on SH into the abdomen off to the side.  I then placed one 3-0 absorbable V-Loc suture through the [facility] into the abdomen off to the side.  I then placed a 8 mm trocar through the [facility].   We then deployed the robot for pelvic surgery from patient's right.  The robot was docked.  Robotic laparoscope was placed through the supraumbilical trocar and the anatomy was targeted.  The other arms were then connected to the trochars.  A  pair of MCS scissors was placed through the right trocar and a fenestrated bipolar through the left trocar all under direct visualization.  I then scrubbed out and went to the robotic console.    I then made incision along the peritoneum on the left, starting 2 inches above the anterior superior iliac spine and caring it medial toward the median umbilical ligament in a  lazy S configuration using MCS scissors with electrocautery. The peritoneal flap was then gently dissected downward from the anterior abdominal wall taking care not to  injure the inferior epigastric vessels. The pubic bone was identified. The testicular vessels were identified.  Using traction and counter traction, I reduced the sac in  its entirety. The testicular vessels had been identified and preserved. The vas deferens was identified and preserved, and the hernia sac was stripped from those to  surrounding structures.   A cord lipoma was stripped from the cord structures and separated.  The dissection also continued below the level of the pubic bone for about 2 cm.  The external iliac vein was identified.  There is no evidence of femoral or obturator hernia.   I then went about creating a large pocket by lifting the peritoneum of the pelvic floor. I took great care not to injure the iliac vessels.      I then obtained the previously placed piece of Bard large 3D left max mesh for the left groin and placed it into the inguinal area.   half of it covered medial  to the inferior epigastric vessels and half of it lateral to the  inferior epigastric vessels. The defect was well  covered with the mesh.  I then secured the mesh to Cooper's ligament with an interrupted 3-0 Vicryl suture.  I placed an additional suture superior medially along the edge of the mesh medial to the inferior gastric vessel.  I placed a 3 Vicryl suture laterally along the superior lateral edge of the mesh lateral to the inferior epigastric vessels.  I then closed the peritoneal flap with a running 3-0 Vicryl V-Loc.  The mesh was well covered.  There is no defect in the peritoneal flap.  The mesh was flat.  It had not curled up.      The surgical robot was undocked and moved away from the OR table.  I scrubbed back in. The sutures that had been placed in the abdomen at the begin the case were removed along with the cord lipoma.   I  removed the Hasson trocar and tied down the previously placed pursestring suture.  The closure was viewed laparoscopically.  I did place an additional interrupted 0 Vicryl suture using a PMI suture passer with laparoscopic guidance.  Additional local was infiltrated in this region.  There was no evidence of fascial defect. There was no air leak at the umbilicus. There was no  evidence of injury to surrounding structures. Pneumoperitoneum was released, and the remaining trocars were removed. All skin incisions  were closed with a 4-0 Monocryl in a subcuticular fashion followed by application of Steri-Strips and sterile bandages. All needle, instrument, and sponge counts  were correct x2.   There are no immediate complications.  The Foley catheter was removed.  The patient tolerated the procedure well. The patient was extubated and taken to the  recovery room in stable condition.  [name]. [name], MD, FACS General, Bariatric, & Minimally Invasive Surgery [facility], [place]

## 2019-10-14 ENCOUNTER — Encounter: Payer: Self-pay | Admitting: Family Medicine

## 2019-10-14 ENCOUNTER — Encounter (HOSPITAL_COMMUNITY): Payer: Self-pay | Admitting: General Surgery

## 2019-10-14 ENCOUNTER — Telehealth: Payer: Self-pay | Admitting: Family Medicine

## 2019-10-14 DIAGNOSIS — R001 Bradycardia, unspecified: Secondary | ICD-10-CM

## 2019-10-14 NOTE — Telephone Encounter (Signed)
Pt states he had surgery last week, patient states he had a low heart  rate in the 30's and was told to follow up with cardiologist.  Patient is seeking a referral to cardiology , please advise

## 2019-10-15 ENCOUNTER — Encounter: Payer: BC Managed Care – PPO | Admitting: Family Medicine

## 2019-10-15 NOTE — Progress Notes (Signed)
Cardiology Office Note:   Date:  10/16/2019  NAME:  Erik Weber    MRN: [id number] DOB:  23-Apr-1975   PCP:  Darreld Mclean, MD  Cardiologist:  No primary care provider on file.   Referring MD: Darreld Mclean, MD   Chief Complaint  Patient presents with  . Bradycardia   History of Present Illness:   Erik Weber is a 44 y.o. male with a hx of GERD, obesity, HTN who is being seen today for the evaluation of bradycardia at the request of Copland, Gay Filler, MD. He had sinus bradycardia reported during hernia surgery last week. Instructed to see cardiology. No EKG performed.   He reports he had surgery done his heart rate remained low throughout the case.  His EKG demonstrates a heart rate of 63 on EKG with normal sinus rhythm and no evidence of ischemia or infarction.  He has a very normal EKG.  His medical history is significant for hypertension.  It is well controlled today.  He has no history of diabetes.  He did have gastric bypass surgery last year.  He is done well since that time.  His most recent cholesterol profile shows a total cholesterol 155, HDL 28, LDL 101, triglycerides 123.  Most recent A1c 4.7.  TSH was 1.08 in March 2020.  He does need an updated thyroid panel.  He reports he does not exercise routinely at the moment.  He has no limitations with his current level of activity.  He can climb a flight of stairs without any chest pain, shortness of breath or palpitations.  He reports that he has no dizzy episodes or syncope reported.  He has no limitations from what he tells me today.  Cardiovascular exam is very normal.  He does not smoke, drink alcohol or use drugs.  He works as a Radiation protection practitioner.  He presents with his fiance today, they are planning a wedding in November.  No strong family history of heart disease.  No history of heart disease in the patient either.  He is asymptomatic today.  Past Medical History: Past Medical History:  Diagnosis  Date  . Chronic renal insufficiency   . Fracture of base of fifth metacarpal bone of right hand   . GERD (gastroesophageal reflux disease)    hx of   . Hypertension   . Morbid [place] ([place])   . Pneumonitis 10/04/2018   after accidental inhalation of swimming pool water during swimmng lesson  . 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 Sleep apnea    Mild, no cpap needed    Past Surgical History: Past Surgical History:  Procedure Laterality Date  . ADENOIDECTOMY     childhood  . 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 HAND SURGERY Right 02/2016  . UPPER GI ENDOSCOPY  03/10/2016  . XI ROBOTIC ASSISTED INGUINAL HERNIA REPAIR WITH MESH Left 10/10/2019   Procedure: XI ROBOTIC ASSISTED LEFT INGUINAL HERNIA REPAIR WITH MESH;  Surgeon: Greer Pickerel, MD;  Location: [facility];  Service: General;  Laterality: Left;    Current Medications: Current Meds  Medication Sig  . amLODipine (NORVASC) 5 MG tablet Take 1 tablet (5 mg total) by mouth daily. (Patient taking differently: Take 2.5 mg by mouth daily. )  . ferrous sulfate 325 (65 FE) MG tablet Take  325 mg by mouth daily with breakfast.  . Multiple Vitamins-Minerals (MULTIVITAMIN WITH MINERALS) tablet Take 1 tablet by mouth daily.     Allergies:    Patient has no known allergies.   Social History: Social History   Socioeconomic History  . Marital status: Single    Spouse name: Not on file  . Number of children: 1  . Years of education: 24  . Highest education level: Not on file  Occupational History  . Occupation: Heritage manager  Tobacco Use  . Smoking status: Never Smoker  . Smokeless tobacco: Never Used  Vaping Use  . Vaping Use: Never used  Substance and Sexual Activity  . Alcohol use: No    Alcohol/week: 0.0 standard drinks  . Drug use: No  . Sexual activity: Yes      Partners: Male  Other Topics Concern  . Not on file  Social History Narrative   Occupation: Works for lab   Single    Adopted daughter - 4   Moved from [place] 4 yrs ago.   Never Smoked   Alcohol use-no   Caffeine use/day:  None   Does Patient Exercise:  yes            Social Determinants of Health   Financial Resource Strain:   . Difficulty of Paying Living Expenses: Not on file  Food Insecurity:   . Worried About Charity fundraiser in the Last Year: Not on file  . Ran Out of Food in the Last Year: Not on file  Transportation Needs:   . Lack of Transportation (Medical): Not on file  . Lack of Transportation (Non-Medical): Not on file  Physical Activity:   . Days of Exercise per Week: Not on file  . Minutes of Exercise per Session: Not on file  Stress:   . Feeling of Stress : Not on file  Social Connections:   . Frequency of Communication with Friends and Family: Not on file  . Frequency of Social Gatherings with Friends and Family: Not on file  . Attends Religious Services: Not on file  . Active Member of Clubs or Organizations: Not on file  . Attends Archivist Meetings: Not on file  . Marital Status: Not on file     Family History: The patient's family history includes Diabetes in his father; Hypertension in his father; Kidney cancer (age of onset: 2) in his mother; Lung cancer in his paternal uncle. There is no history of Colon cancer, Prostate cancer, Liver disease, Esophageal cancer, Stomach cancer, or Pancreatic cancer.  ROS:   All other ROS reviewed and negative. Pertinent positives noted in the HPI.     EKGs/Labs/Other Studies Reviewed:   The following studies were personally reviewed by me today:  EKG:  EKG is ordered today.  The ekg ordered today demonstrates normal sinus rhythm, heart rate 63, no acute ST-T changes, no evidence of prior infarct, and was personally reviewed by me.   Recent Labs: 02/14/2019: ALT 24 10/08/2019: BUN 12;  Creatinine, Ser 1.31; Hemoglobin 13.9; Platelets 265; Potassium 4.0; Sodium 141   Recent Lipid Panel    Component Value Date/Time   CHOL 155 11/14/2018 1559   TRIG 123.0 11/14/2018 1559   HDL 28.50 (L) 11/14/2018 1559   CHOLHDL 5 11/14/2018 1559   VLDL 24.6 11/14/2018 1559   LDLCALC 101 (H) 11/14/2018 1559   LDLDIRECT 95.0 08/08/2018 0822    Physical Exam:   VS:  BP (!) 142/76   Pulse  63   Ht 6' (1.829 m)   Wt 198 lb 9.6 oz (90.1 kg)   SpO2 99%   BMI 26.94 kg/m    Wt Readings from Last 3 Encounters:  10/16/19 198 lb 9.6 oz (90.1 kg)  10/10/19 202 lb 8 oz (91.9 kg)  10/08/19 202 lb 8 oz (91.9 kg)    General: Well nourished, well developed, in no acute distress Heart: Atraumatic, normal size  Eyes: PEERLA, EOMI  Neck: Supple, no JVD Endocrine: No thryomegaly Cardiac: Normal S1, S2; RRR; no murmurs, rubs, or gallops Lungs: Clear to auscultation bilaterally, no wheezing, rhonchi or rales  Abd: Soft, nontender, no hepatomegaly  Ext: No edema, pulses 2+ Musculoskeletal: No deformities, BUE and BLE strength normal and equal Skin: Warm and dry, no rashes   Neuro: Alert and oriented to person, place, time, and situation, CNII-XII grossly intact, no focal deficits  Psych: Normal mood and affect   ASSESSMENT:   Erik Weber is a 44 y.o. male who presents for the following: 1. Bradycardia    PLAN:   1. Bradycardia -He had reported bradycardia during recent surgery.  His vital signs remained stable throughout the case.  He was never unstable from what I can tell from the anesthesia documentation.  His EKG today demonstrates normal sinus rhythm with no acute ST-T changes or evidence of prior infarction.  He has normal intervals on his EKG.  This is an extremely normal EKG.  He also has a very normal cardiovascular examination.  There are no murmurs rubs or gallops.  He does need an updated TSH.  His most recent TSH was 1.08 in March 2020.  We will check that today.  I see no  medications that will cause this.  He has no limitations.  We will check a TSH and see him as needed.  Given his lack of symptoms and normal cardiovascular examination he needs no further testing.  Disposition: Return if symptoms worsen or fail to improve.  Medication Adjustments/Labs and Tests Ordered: Current medicines are reviewed at length with the patient today.  Concerns regarding medicines are outlined above.  Orders Placed This Encounter  Procedures  . TSH  . EKG 12-Lead   No orders of the defined types were placed in this encounter.   Patient Instructions  Medication Instructions:  The current medical regimen is effective;  continue present plan and medications.  *If you need a refill on your cardiac medications before your next appointment, please call your pharmacy*   Lab Work: TSH today   If you have labs (blood work) drawn today and your tests are completely normal, you will receive your results only by: Marland Kitchen MyChart Message (if you have MyChart) OR . A paper copy in the mail If you have any lab test that is abnormal or we need to change your treatment, we will call you to review the results.  Follow-Up: At [facility], you and your health needs are our priority.  As part of our continuing mission to provide you with exceptional heart care, we have created designated Provider Care Teams.  These Care Teams include your primary Cardiologist (physician) and Advanced Practice Providers (APPs -  Physician Assistants and Nurse Practitioners) who all work together to provide you with the care you need, when you need it.  We recommend signing up for the patient portal called "MyChart".  Sign up information is provided on this After Visit Summary.  MyChart is used to connect with patients for Virtual Visits (  Telemedicine).  Patients are able to view lab/test results, encounter notes, upcoming appointments, etc.  Non-urgent messages can be sent to your provider as well.   To learn  more about what you can do with MyChart, go to [email or web address].    Your next appointment:   As needed  The format for your next appointment:   In Person  Provider:   Eleonore Chiquito, MD        Signed, Addison Naegeli. Audie Box, [place]  [address] [place], [place] [address] [phone]  10/16/2019 8:30 AM

## 2019-10-16 ENCOUNTER — Encounter: Payer: Self-pay | Admitting: Cardiovascular Disease

## 2019-10-16 ENCOUNTER — Other Ambulatory Visit: Payer: Self-pay

## 2019-10-16 ENCOUNTER — Ambulatory Visit (INDEPENDENT_AMBULATORY_CARE_PROVIDER_SITE_OTHER): Payer: BC Managed Care – PPO | Admitting: Cardiovascular Disease

## 2019-10-16 VITALS — BP 142/76 | HR 63 | Ht 72.0 in | Wt 198.6 lb

## 2019-10-16 DIAGNOSIS — R001 Bradycardia, unspecified: Secondary | ICD-10-CM

## 2019-10-16 LAB — TSH: TSH: 1 u[IU]/mL (ref 0.450–4.500)

## 2019-10-16 NOTE — Patient Instructions (Signed)
Medication Instructions:  The current medical regimen is effective;  continue present plan and medications.  *If you need a refill on your cardiac medications before your next appointment, please call your pharmacy*   Lab Work: TSH today   If you have labs (blood work) drawn today and your tests are completely normal, you will receive your results only by:  [place] (if you have MyChart) OR  A paper copy in the mail If you have any lab test that is abnormal or we need to change your treatment, we will call you to review the results.  Follow-Up: At [facility], you and your health needs are our priority.  As part of our continuing mission to provide you with exceptional heart care, we have created designated Provider Care Teams.  These Care Teams include your primary Cardiologist (physician) and Advanced Practice Providers (APPs -  Physician Assistants and Nurse Practitioners) who all work together to provide you with the care you need, when you need it.  We recommend signing up for the patient portal called "MyChart".  Sign up information is provided on this After Visit Summary.  MyChart is used to connect with patients for Virtual Visits (Telemedicine).  Patients are able to view lab/test results, encounter notes, upcoming appointments, etc.  Non-urgent messages can be sent to your provider as well.   To learn more about what you can do with MyChart, go to [email or web address].    Your next appointment:   As needed  The format for your next appointment:   In Person  Provider:   Eleonore Chiquito, MD

## 2019-10-23 ENCOUNTER — Ambulatory Visit: Payer: BC Managed Care – PPO | Admitting: Family Medicine

## 2019-10-27 ENCOUNTER — Encounter: Payer: BC Managed Care – PPO | Admitting: Family Medicine

## 2019-11-09 NOTE — Patient Instructions (Addendum)
It was great to see you again today!   You look terrific  I will be in touch with your labs as asap Ok to try stopping your amlodipine- if BP starts running higher than 130/85 restart this medication We will get you set up with a Cologuard kit to screen for colon cancer at home    Health Maintenance, Male Adopting a healthy lifestyle and getting preventive care are important in promoting health and wellness. Ask your health care provider about:  The right schedule for you to have regular tests and exams.  Things you can do on your own to prevent diseases and keep yourself healthy. What should I know about diet, weight, and exercise? Eat a healthy diet   Eat a diet that includes plenty of vegetables, fruits, low-fat dairy products, and lean protein.  Do not eat a lot of foods that are high in solid fats, added sugars, or sodium. Maintain a healthy weight Body mass index (BMI) is a measurement that can be used to identify possible weight problems. It estimates body fat based on height and weight. Your health care provider can help determine your BMI and help you achieve or maintain a healthy weight. Get regular exercise Get regular exercise. This is one of the most important things you can do for your health. Most adults should:  Exercise for at least 150 minutes each week. The exercise should increase your heart rate and make you sweat (moderate-intensity exercise).  Do strengthening exercises at least twice a week. This is in addition to the moderate-intensity exercise.  Spend less time sitting. Even light physical activity can be beneficial. Watch cholesterol and blood lipids Have your blood tested for lipids and cholesterol at 45 years of age, then have this test every 5 years. You may need to have your cholesterol levels checked more often if:  Your lipid or cholesterol levels are high.  You are older than 45 years of age.  You are at high risk for heart disease. What should  I know about cancer screening? Many types of cancers can be detected early and may often be prevented. Depending on your health history and family history, you may need to have cancer screening at various ages. This may include screening for:  Colorectal cancer.  Prostate cancer.  Skin cancer.  Lung cancer. What should I know about heart disease, diabetes, and high blood pressure? Blood pressure and heart disease  High blood pressure causes heart disease and increases the risk of stroke. This is more likely to develop in people who have high blood pressure readings, are of African descent, or are overweight.  Talk with your health care provider about your target blood pressure readings.  Have your blood pressure checked: ? Every 3-5 years if you are 67-45 years of age. ? Every year if you are 34 years old or older.  If you are between the ages of 73 and 41 and are a current or former smoker, ask your health care provider if you should have a one-time screening for abdominal aortic aneurysm (AAA). Diabetes Have regular diabetes screenings. This checks your fasting blood sugar level. Have the screening done:  Once every three years after age 26 if you are at a normal weight and have a low risk for diabetes.  More often and at a younger age if you are overweight or have a high risk for diabetes. What should I know about preventing infection? Hepatitis B If you have a higher risk for hepatitis  B, you should be screened for this virus. Talk with your health care provider to find out if you are at risk for hepatitis B infection. Hepatitis C Blood testing is recommended for:  Everyone born from [birth year] through 1966.  Anyone with known risk factors for hepatitis C. Sexually transmitted infections (STIs)  You should be screened each year for STIs, including gonorrhea and chlamydia, if: ? You are sexually active and are younger than 45 years of age. ? You are older than 45 years of age  and your health care provider tells you that you are at risk for this type of infection. ? Your sexual activity has changed since you were last screened, and you are at increased risk for chlamydia or gonorrhea. Ask your health care provider if you are at risk.  Ask your health care provider about whether you are at high risk for HIV. Your health care provider may recommend a prescription medicine to help prevent HIV infection. If you choose to take medicine to prevent HIV, you should first get tested for HIV. You should then be tested every 3 months for as long as you are taking the medicine. Follow these instructions at home: Lifestyle  Do not use any products that contain nicotine or tobacco, such as cigarettes, e-cigarettes, and chewing tobacco. If you need help quitting, ask your health care provider.  Do not use street drugs.  Do not share needles.  Ask your health care provider for help if you need support or information about quitting drugs. Alcohol use  Do not drink alcohol if your health care provider tells you not to drink.  If you drink alcohol: ? Limit how much you have to 0-2 drinks a day. ? Be aware of how much alcohol is in your drink. In the [place] one drink equals one 12 oz bottle of beer (355 mL), one 5 oz glass of wine (148 mL), or one 1 oz glass of hard liquor (44 mL). General instructions  Schedule regular health, dental, and eye exams.  Stay current with your vaccines.  Tell your health care provider if: ? You often feel depressed. ? You have ever been abused or do not feel safe at home. Summary  Adopting a healthy lifestyle and getting preventive care are important in promoting health and wellness.  Follow your health care provider's instructions about healthy diet, exercising, and getting tested or screened for diseases.  Follow your health care provider's instructions on monitoring your cholesterol and blood pressure. This information is not intended to  replace advice given to you by your health care provider. Make sure you discuss any questions you have with your health care provider. Document Revised: 01/16/2018 Document Reviewed: 01/16/2018 Elsevier Patient Education  2020 [employer].

## 2019-11-09 NOTE — Progress Notes (Addendum)
[facility] [place], [place], [place] [address] [phone]  Date:  11/12/2019   Name:  Erik Weber   DOB:  10/14/1975   MRN:  [id number]  PCP:  Darreld Mclean, MD    Chief Complaint: Annual Exam   History of Present Illness:  Erik Weber is a 44 y.o. very pleasant male patient who presents with the following:  Patient today for periodic follow-up  History of chronic kidney disease, sleep apnea, hypertension, gastric bypass in September 2020 Last seen by myself about 1 year ago He had an inguinal hernia repair per Dr. [name] 1 month ago; pain is resolved  He is meeting his partner Harrell Gave in about 1 month; he is excited but ready for the stress of the wedding to be behind him  Hepatitis C screening-today Flu vaccine- done already  COVID-19 vaccine complete Colon cancer screening- will order cologuard after discussion.  No increased risk for colon cancer  BMP, CBC done preop Recent TSH normal  He does check BP at home- has been under good control  He is maintaining his weight every day bypass and overall thriving  BP Readings from Last 3 Encounters:  11/12/19 118/68  10/16/19 (!) 142/76  10/10/19 (!) 145/101     Wt Readings from Last 3 Encounters:  11/12/19 195 lb (88.5 kg)  10/16/19 198 lb 9.6 oz (90.1 kg)  10/10/19 202 lb 8 oz (91.9 kg)    Patient Active Problem List   Diagnosis Date Noted  . CKD (chronic kidney disease) stage 3, GFR 30-59 ml/min ([place]) 11/25/2018  . OSA (obstructive sleep apnea) 10/28/2018  . 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 Obesity 08/18/2013  . Tinea versicolor 05/19/2013  . Hypertriglyceridemia 05/28/2012  . Hypogonadism male 06/07/2011  . Essential hypertension 09/21/2009  . 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 GERD 04/04/2007    Past Medical History:  Diagnosis Date  . Chronic renal insufficiency   . Fracture of base of  fifth metacarpal bone of right hand   . GERD (gastroesophageal reflux disease)    hx of   . Hypertension   . Morbid obesity ([place])   . Pneumonitis 10/04/2018   after accidental inhalation of swimming pool water during swimmng lesson  . 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 Sleep apnea    Mild, no cpap needed    Past Surgical History:  Procedure Laterality Date  . ADENOIDECTOMY     childhood  . 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 HAND SURGERY Right 02/2016  . UPPER GI ENDOSCOPY  03/10/2016  . XI ROBOTIC ASSISTED INGUINAL HERNIA REPAIR WITH MESH Left 10/10/2019   Procedure: XI ROBOTIC ASSISTED LEFT INGUINAL HERNIA REPAIR WITH MESH;  Surgeon: Greer Pickerel, MD;  Location: [facility];  Service: General;  Laterality: Left;    Social History   Tobacco Use  . Smoking status: Never Smoker  . Smokeless tobacco: Never Used  Vaping Use  . Vaping Use: Never used  Substance Use Topics  . Alcohol use: No    Alcohol/week: 0.0 standard drinks  . Drug use: No    Family History  Problem Relation Age of Onset  . Kidney cancer Mother 67  . Hypertension Father  age 62  . Diabetes Father   . Lung cancer Paternal Uncle   . Colon cancer Neg Hx   . Prostate cancer Neg Hx   . Liver disease Neg Hx   . Esophageal cancer Neg Hx   . Stomach cancer Neg Hx   . Pancreatic cancer Neg Hx     No Known Allergies  Medication list has been reviewed and updated.  Current Outpatient Medications on File Prior to Visit  Medication Sig Dispense Refill  . amLODipine (NORVASC) 2.5 MG tablet Take 2.5 mg by mouth daily.    . ferrous sulfate 325 (65 FE) MG tablet Take 325 mg by mouth daily with breakfast.    . Multiple Vitamins-Minerals (MULTIVITAMIN WITH MINERALS) tablet Take 1 tablet by mouth daily.     No current facility-administered  medications on file prior to visit.    Review of Systems:  As per HPI- otherwise negative.   Physical Examination: Vitals:   11/12/19 0939  BP: 118/68  Pulse: 62  Resp: 16  SpO2: 98%   Vitals:   11/12/19 0939  Weight: 195 lb (88.5 kg)  Height: 6' (1.829 m)   Body mass index is 26.45 kg/m. Ideal Body Weight: Weight in (lb) to have BMI = 25: 183.9  GEN: no acute distress.  Normal weight, looks well HEENT: Atraumatic, Normocephalic.  Ears and Nose: No external deformity. CV: RRR, No M/G/R. No JVD. No thrill. No extra heart sounds. PULM: CTA B, no wheezes, crackles, rhonchi. No retractions. No resp. distress. No accessory muscle use. ABD: S, NT, ND, +BS. No rebound. No HSM. EXTR: No c/c/e PSYCH: Normally interactive. Conversant.    Assessment and Plan: S/P gastric bypass - Plan: VITAMIN D 25 Hydroxy (Vit-D Deficiency, Fractures), Vitamin B12, Ferritin  Essential hypertension  Stage 3 chronic kidney disease, unspecified whether stage 3a or 3b CKD (HCC)  Encounter for hepatitis C screening test for low risk patient - Plan: Hepatitis C antibody  Screening for prostate cancer - Plan: PSA  Screening for colon cancer  Following up today on post gastric bypass labs as above Blood pressure under good control Order Cologuard Will plan further follow- up pending labs. Recent lab work showed improvement of kidney function This visit occurred during the SARS-CoV-2 public health emergency.  Safety protocols were in place, including screening questions prior to the visit, additional usage of staff PPE, and extensive cleaning of exam room while observing appropriate contact time as indicated for disinfecting solutions.    Signed Lamar Blinks, MD  Addendum 10/7, received labs as below  Message to patient  Results for orders placed or performed in visit on 11/12/19  VITAMIN D 25 Hydroxy (Vit-D Deficiency, Fractures)  Result Value Ref Range   Vit D, 25-Hydroxy 51 30 -  100 ng/mL  Vitamin B12  Result Value Ref Range   Vitamin B-12 504 200 - 1,100 pg/mL  Ferritin  Result Value Ref Range   Ferritin 22 (L) 38 - 380 ng/mL  PSA  Result Value Ref Range   PSA 0.38 < OR = 4.0 ng/mL

## 2019-11-12 ENCOUNTER — Encounter: Payer: Self-pay | Admitting: Family Medicine

## 2019-11-12 ENCOUNTER — Ambulatory Visit (INDEPENDENT_AMBULATORY_CARE_PROVIDER_SITE_OTHER): Payer: BC Managed Care – PPO | Admitting: Family Medicine

## 2019-11-12 ENCOUNTER — Other Ambulatory Visit: Payer: Self-pay

## 2019-11-12 VITALS — BP 118/68 | HR 62 | Resp 16 | Ht 72.0 in | Wt 195.0 lb

## 2019-11-12 DIAGNOSIS — Z0000 Encounter for general adult medical examination without abnormal findings: Secondary | ICD-10-CM | POA: Diagnosis not present

## 2019-11-12 DIAGNOSIS — Z125 Encounter for screening for malignant neoplasm of prostate: Secondary | ICD-10-CM

## 2019-11-12 DIAGNOSIS — R5383 Other fatigue: Secondary | ICD-10-CM | POA: Diagnosis not present

## 2019-11-12 DIAGNOSIS — N183 Chronic kidney disease, stage 3 unspecified: Secondary | ICD-10-CM | POA: Diagnosis not present

## 2019-11-12 DIAGNOSIS — Z1159 Encounter for screening for other viral diseases: Secondary | ICD-10-CM

## 2019-11-12 DIAGNOSIS — Z1211 Encounter for screening for malignant neoplasm of colon: Secondary | ICD-10-CM

## 2019-11-12 DIAGNOSIS — Z9884 Bariatric surgery status: Secondary | ICD-10-CM

## 2019-11-12 DIAGNOSIS — K909 Intestinal malabsorption, unspecified: Secondary | ICD-10-CM | POA: Diagnosis not present

## 2019-11-12 DIAGNOSIS — I10 Essential (primary) hypertension: Secondary | ICD-10-CM

## 2019-11-12 NOTE — Progress Notes (Signed)
Cologuard ordered for patient.

## 2019-11-13 ENCOUNTER — Encounter: Payer: Self-pay | Admitting: Family Medicine

## 2019-11-13 DIAGNOSIS — E611 Iron deficiency: Secondary | ICD-10-CM

## 2019-11-13 LAB — FERRITIN: Ferritin: 22 ng/mL — ABNORMAL LOW (ref 38–380)

## 2019-11-13 LAB — HEPATITIS C ANTIBODY
Hepatitis C Ab: NONREACTIVE
SIGNAL TO CUT-OFF: 0.01 (ref <1.00)

## 2019-11-13 LAB — PSA: PSA: 0.38 ng/mL (ref <=4.0)

## 2019-11-13 LAB — VITAMIN D 25 HYDROXY (VIT D DEFICIENCY, FRACTURES): Vit D, 25-Hydroxy: 51 ng/mL (ref 30–100)

## 2019-11-13 LAB — VITAMIN B12: Vitamin B-12: 504 pg/mL (ref 200–1100)

## 2019-11-14 NOTE — Addendum Note (Signed)
Addended by: Lamar Blinks C on: 11/14/2019 03:33 PM   Modules accepted: Orders

## 2019-12-02 ENCOUNTER — Other Ambulatory Visit: Payer: Self-pay | Admitting: Family

## 2019-12-02 DIAGNOSIS — D649 Anemia, unspecified: Secondary | ICD-10-CM

## 2019-12-03 ENCOUNTER — Inpatient Hospital Stay: Payer: BC Managed Care – PPO | Attending: Hematology & Oncology

## 2019-12-03 ENCOUNTER — Inpatient Hospital Stay (HOSPITAL_BASED_OUTPATIENT_CLINIC_OR_DEPARTMENT_OTHER): Payer: BC Managed Care – PPO | Admitting: Family

## 2019-12-03 ENCOUNTER — Telehealth: Payer: Self-pay

## 2019-12-03 ENCOUNTER — Other Ambulatory Visit: Payer: Self-pay

## 2019-12-03 ENCOUNTER — Encounter: Payer: Self-pay | Admitting: Family

## 2019-12-03 VITALS — BP 142/78 | HR 53 | Temp 98.6°F | Wt 200.4 lb

## 2019-12-03 DIAGNOSIS — E611 Iron deficiency: Secondary | ICD-10-CM | POA: Diagnosis not present

## 2019-12-03 DIAGNOSIS — D573 Sickle-cell trait: Secondary | ICD-10-CM | POA: Insufficient documentation

## 2019-12-03 DIAGNOSIS — Z8051 Family history of malignant neoplasm of kidney: Secondary | ICD-10-CM | POA: Diagnosis not present

## 2019-12-03 DIAGNOSIS — Z9884 Bariatric surgery status: Secondary | ICD-10-CM | POA: Diagnosis not present

## 2019-12-03 DIAGNOSIS — D649 Anemia, unspecified: Secondary | ICD-10-CM | POA: Diagnosis not present

## 2019-12-03 DIAGNOSIS — D508 Other iron deficiency anemias: Secondary | ICD-10-CM

## 2019-12-03 DIAGNOSIS — Z801 Family history of malignant neoplasm of trachea, bronchus and lung: Secondary | ICD-10-CM | POA: Diagnosis not present

## 2019-12-03 LAB — CBC WITH DIFFERENTIAL (CANCER CENTER ONLY)
Abs Immature Granulocytes: 0.02 10*3/uL (ref 0.00–0.07)
Basophils Absolute: 0.1 10*3/uL (ref 0.0–0.1)
Basophils Relative: 1 %
Eosinophils Absolute: 0.1 10*3/uL (ref 0.0–0.5)
Eosinophils Relative: 1 %
HCT: 43.4 % (ref 39.0–52.0)
Hemoglobin: 14.1 g/dL (ref 13.0–17.0)
Immature Granulocytes: 0 %
Lymphocytes Relative: 48 %
Lymphs Abs: 5.1 10*3/uL — ABNORMAL HIGH (ref 0.7–4.0)
MCH: 27.9 pg (ref 26.0–34.0)
MCHC: 32.5 g/dL (ref 30.0–36.0)
MCV: 85.8 fL (ref 80.0–100.0)
Monocytes Absolute: 0.7 10*3/uL (ref 0.1–1.0)
Monocytes Relative: 7 %
Neutro Abs: 4.5 10*3/uL (ref 1.7–7.7)
Neutrophils Relative %: 43 %
Platelet Count: 285 10*3/uL (ref 150–400)
RBC: 5.06 MIL/uL (ref 4.22–5.81)
RDW: 11.9 % (ref 11.5–15.5)
WBC Count: 10.5 10*3/uL (ref 4.0–10.5)
nRBC: 0 % (ref 0.0–0.2)

## 2019-12-03 LAB — CMP (CANCER CENTER ONLY)
ALT: 27 U/L (ref 0–44)
AST: 21 U/L (ref 15–41)
Albumin: 4.4 g/dL (ref 3.5–5.0)
Alkaline Phosphatase: 76 U/L (ref 38–126)
Anion gap: 7 (ref 5–15)
BUN: 11 mg/dL (ref 6–20)
CO2: 30 mmol/L (ref 22–32)
Calcium: 9.8 mg/dL (ref 8.9–10.3)
Chloride: 105 mmol/L (ref 98–111)
Creatinine: 1.55 mg/dL — ABNORMAL HIGH (ref 0.61–1.24)
GFR, Estimated: 56 mL/min — ABNORMAL LOW (ref >60)
Glucose, Bld: 70 mg/dL (ref 70–99)
Potassium: 4.1 mmol/L (ref 3.5–5.1)
Sodium: 142 mmol/L (ref 135–145)
Total Bilirubin: 0.6 mg/dL (ref 0.3–1.2)
Total Protein: 7.4 g/dL (ref 6.5–8.1)

## 2019-12-03 LAB — RETICULOCYTES
Immature Retic Fract: 5.5 % (ref 2.3–15.9)
RBC.: 5.05 MIL/uL (ref 4.22–5.81)
Retic Count, Absolute: 61.6 10*3/uL (ref 19.0–186.0)
Retic Ct Pct: 1.2 % (ref 0.4–3.1)

## 2019-12-03 LAB — SAVE SMEAR(SSMR), FOR PROVIDER SLIDE REVIEW

## 2019-12-03 LAB — LACTATE DEHYDROGENASE: LDH: 159 U/L (ref 98–192)

## 2019-12-03 NOTE — Telephone Encounter (Signed)
That will be fine with me. 

## 2019-12-03 NOTE — Progress Notes (Signed)
Hematology/Oncology Consultation   Name: Erik Weber      MRN: [id number]    Location: Room/bed info not found  Date: 12/03/2019 Time:4:05 PM   REFERRING PHYSICIAN: Lamar Blinks, MD  REASON FOR CONSULT: Iron deficiency    DIAGNOSIS: Iron deficiency and sickle cell trait  HISTORY OF PRESENT ILLNESS: Erik Weber is a very pleasant 44 yo African American gentleman with recently diagnosed iron deficiency.  He had gastric bypass in September 2020 followed by inguinal hernia repair in September 2021.  He has not noted any blood loss. No abnormal bruising, no petechiae.  His ferritin last month was 22 despite being on an iron supplement. Iron studies were drawn today and results are pending.  He is symptomatic with chills, fatigue, heart palpitations (since childhood) and lightheadedness with palpitations.  He states that he was worked up by cardiology as a child and has an appointment with Dr. Harriet Masson on Monday for further work-up.  He does have the sickle cell trait.  History of stage III chronic kidney disease. Epo level pending.  B 12 earlier this month was 504, Vitamin D 51 and TSH 1.000.  No fever, n/v, cough, rash, SOB, chest pain, abdominal pain or changes in bowel or bladder habits.  No swelling, tenderness, numbness or tingling in his extremities.  No falls or syncopal episodes to report.  No smoking, ETOH or recreational drug use.  No history of diabetes or thyroid disease.  He has maintained a good appetite and is staying well hydrated. His weight is stable.  He works out at [employer] regularly and also rides a bike.  He currently works for an IT consultant.  He is getting married Saturday and then honeymooning in [place].   ROS: All other 10 point review of systems is negative.   PAST MEDICAL HISTORY:   Past Medical History:  Diagnosis Date  . Chronic renal insufficiency   . Fracture of base of fifth metacarpal bone of right hand   . GERD (gastroesophageal reflux disease)     hx of   . Hypertension   . Morbid obesity ([place])   . Pneumonitis 10/04/2018   after accidental inhalation of swimming pool water during swimmng lesson  . 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 Sleep apnea    Mild, no cpap needed    ALLERGIES: No Known Allergies    MEDICATIONS:  Current Outpatient Medications on File Prior to Visit  Medication Sig Dispense Refill  . ferrous sulfate 325 (65 FE) MG tablet Take 325 mg by mouth daily with breakfast.    . Multiple Vitamins-Minerals (MULTIVITAMIN WITH MINERALS) tablet Take 1 tablet by mouth daily.     No current facility-administered medications on file prior to visit.     PAST SURGICAL HISTORY Past Surgical History:  Procedure Laterality Date  . ADENOIDECTOMY     childhood  . 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 HAND SURGERY Right 02/2016  . UPPER GI ENDOSCOPY  03/10/2016  . XI ROBOTIC ASSISTED INGUINAL HERNIA REPAIR WITH MESH Left 10/10/2019   Procedure: XI ROBOTIC ASSISTED LEFT INGUINAL HERNIA REPAIR WITH MESH;  Surgeon: Greer Pickerel, MD;  Location: [facility];  Service: General;  Laterality: Left;    FAMILY HISTORY: Family History  Problem Relation Age of Onset  . Kidney cancer Mother 41  . Hypertension  Father        age 1  . Diabetes Father   . Lung cancer Paternal Uncle   . Colon cancer Neg Hx   . Prostate cancer Neg Hx   . Liver disease Neg Hx   . Esophageal cancer Neg Hx   . Stomach cancer Neg Hx   . Pancreatic cancer Neg Hx     SOCIAL HISTORY:  reports that he has never smoked. He has never used smokeless tobacco. He reports that he does not drink alcohol and does not use drugs.  PERFORMANCE STATUS: The patient's performance status is 1 - Symptomatic but completely ambulatory  PHYSICAL EXAM: Most Recent Vital Signs: Blood pressure (!) 142/78, pulse (!)  53, temperature 98.6 F (37 C), temperature source Oral, weight 200 lb 6.4 oz (90.9 kg), SpO2 100 %. BP (!) 142/78 (BP Location: Right Arm, Patient Position: Sitting)   Pulse (!) 53   Temp 98.6 F (37 C) (Oral)   Wt 200 lb 6.4 oz (90.9 kg)   SpO2 100%   BMI 27.18 kg/m   General Appearance:    Alert, cooperative, no distress, appears stated age  Head:    Normocephalic, without obvious abnormality, atraumatic  Eyes:    PERRL, conjunctiva/corneas clear, EOM's intact, fundi    benign, both eyes             Throat:   Lips, mucosa, and tongue normal; teeth and gums normal  Neck:   Supple, symmetrical, trachea midline, no adenopathy;       thyroid:  No enlargement/tenderness/nodules; no carotid   bruit or JVD  Back:     Symmetric, no curvature, ROM normal, no CVA tenderness  Lungs:     Clear to auscultation bilaterally, respirations unlabored  Chest wall:    No tenderness or deformity  Heart:    Regular rate and rhythm, S1 and S2 normal, no murmur, rub   or gallop  Abdomen:     Soft, non-tender, bowel sounds active all four quadrants,    no masses, no organomegaly        Extremities:   Extremities normal, atraumatic, no cyanosis or edema  Pulses:   2+ and symmetric all extremities  Skin:   Skin color, texture, turgor normal, no rashes or lesions  Lymph nodes:   Cervical, supraclavicular, and axillary nodes normal  Neurologic:   CNII-XII intact. Normal strength, sensation and reflexes      throughout    LABORATORY DATA:  Results for orders placed or performed in visit on 12/03/19 (from the past 48 hour(s))  Save Smear (SSMR)     Status: None   Collection Time: 12/03/19  3:33 PM  Result Value Ref Range   Smear Review SMEAR STAINED AND AVAILABLE FOR REVIEW     Comment: Performed at [facility] at [facility], [address], [place], [place] [address]  CBC with Differential ([place] Only)     Status: Abnormal   Collection Time: 12/03/19  3:33 PM   Result Value Ref Range   WBC Count 10.5 4.0 - 10.5 K/uL   RBC 5.06 4.22 - 5.81 MIL/uL   Hemoglobin 14.1 13.0 - 17.0 g/dL   HCT 43.4 39 - 52 %   MCV 85.8 80.0 - 100.0 fL   MCH 27.9 26.0 - 34.0 pg   MCHC 32.5 30.0 - 36.0 g/dL   RDW 11.9 11.5 - 15.5 %   Platelet Count 285 150 - 400 K/uL  nRBC 0.0 0.0 - 0.2 %   Neutrophils Relative % 43 %   Neutro Abs 4.5 1.7 - 7.7 K/uL   Lymphocytes Relative 48 %   Lymphs Abs 5.1 (H) 0.7 - 4.0 K/uL   Monocytes Relative 7 %   Monocytes Absolute 0.7 0.1 - 1.0 K/uL   Eosinophils Relative 1 %   Eosinophils Absolute 0.1 0.0 - 0.5 K/uL   Basophils Relative 1 %   Basophils Absolute 0.1 0.0 - 0.1 K/uL   Immature Granulocytes 0 %   Abs Immature Granulocytes 0.02 0.00 - 0.07 K/uL    Comment: Performed at [facility] at [facility], [address] [place], [place] [address]  Reticulocytes     Status: None   Collection Time: 12/03/19  3:34 PM  Result Value Ref Range   Retic Ct Pct 1.2 0.4 - 3.1 %   RBC. 5.05 4.22 - 5.81 MIL/uL   Retic Count, Absolute 61.6 19.0 - 186.0 K/uL   Immature Retic Fract 5.5 2.3 - 15.9 %    Comment: Performed at [facility] at [facility], [address] [place], [place] [address]      RADIOGRAPHY: No results found.     PATHOLOGY:None  ASSESSMENT/PLAN: Mr. Krejci is a very pleasant 44 yo African American gentleman with recently diagnosed iron deficiency and history of sickle cell trait.   He is doing fairly well but is symptomatic as mentioned above.  We did extensive work up for anemia today and results are pending.  Patient may well benefit from IV iron as well as the addition of folic acid PO daily.  We will see what his results show and then determine a plan of care.  We will go ahead and plan to see him again in 8 weeks.   All questions were answered and he is in agreement with the plan. He was encouraged to contact our office with any questions or concerns. We  can certainly see him sooner if needed.   Laverna Peace, NP

## 2019-12-03 NOTE — Telephone Encounter (Signed)
OK with me. -W

## 2019-12-04 ENCOUNTER — Telehealth: Payer: Self-pay | Admitting: Family

## 2019-12-04 DIAGNOSIS — D508 Other iron deficiency anemias: Secondary | ICD-10-CM | POA: Insufficient documentation

## 2019-12-04 HISTORY — DX: Other iron deficiency anemias: D50.8

## 2019-12-04 LAB — FERRITIN: Ferritin: 17 ng/mL — ABNORMAL LOW (ref 24–336)

## 2019-12-04 LAB — IRON AND TIBC
Iron: 72 ug/dL (ref 42–163)
Saturation Ratios: 22 % (ref 20–55)
TIBC: 321 ug/dL (ref 202–409)
UIBC: 250 ug/dL (ref 117–376)

## 2019-12-04 LAB — ERYTHROPOIETIN: Erythropoietin: 15.1 m[IU]/mL (ref 2.6–18.5)

## 2019-12-04 NOTE — Telephone Encounter (Signed)
NO los 10/27

## 2019-12-05 ENCOUNTER — Other Ambulatory Visit: Payer: Self-pay

## 2019-12-05 ENCOUNTER — Inpatient Hospital Stay: Payer: BC Managed Care – PPO

## 2019-12-05 VITALS — BP 132/81 | HR 58 | Temp 98.3°F | Resp 17

## 2019-12-05 DIAGNOSIS — G4730 Sleep apnea, unspecified: Secondary | ICD-10-CM | POA: Insufficient documentation

## 2019-12-05 DIAGNOSIS — N289 Disorder of kidney and ureter, unspecified: Secondary | ICD-10-CM | POA: Insufficient documentation

## 2019-12-05 DIAGNOSIS — N189 Chronic kidney disease, unspecified: Secondary | ICD-10-CM | POA: Insufficient documentation

## 2019-12-05 DIAGNOSIS — I10 Essential (primary) hypertension: Secondary | ICD-10-CM | POA: Insufficient documentation

## 2019-12-05 DIAGNOSIS — E611 Iron deficiency: Secondary | ICD-10-CM | POA: Diagnosis not present

## 2019-12-05 DIAGNOSIS — R7611 Nonspecific reaction to tuberculin skin test without active tuberculosis: Secondary | ICD-10-CM | POA: Insufficient documentation

## 2019-12-05 DIAGNOSIS — S62316A Displaced fracture of base of fifth metacarpal bone, right hand, initial encounter for closed fracture: Secondary | ICD-10-CM | POA: Insufficient documentation

## 2019-12-05 DIAGNOSIS — D508 Other iron deficiency anemias: Secondary | ICD-10-CM

## 2019-12-05 DIAGNOSIS — K219 Gastro-esophageal reflux disease without esophagitis: Secondary | ICD-10-CM | POA: Insufficient documentation

## 2019-12-05 LAB — HGB FRACTIONATION CASCADE
Hgb A2: 3.1 % (ref 1.8–3.2)
Hgb A: 55.7 % — ABNORMAL LOW (ref 96.4–98.8)
Hgb F: 0 % (ref 0.0–2.0)
Hgb S: 41.2 % — ABNORMAL HIGH

## 2019-12-05 LAB — HGB SOLUBILITY: Hgb Solubility: POSITIVE — AB

## 2019-12-05 MED ORDER — SODIUM CHLORIDE 0.9 % IV SOLN
200.0000 mg | Freq: Once | INTRAVENOUS | Status: AC
  Administered 2019-12-05: 200 mg via INTRAVENOUS
  Filled 2019-12-05: qty 200

## 2019-12-05 MED ORDER — SODIUM CHLORIDE 0.9 % IV SOLN
Freq: Once | INTRAVENOUS | Status: AC
  Filled 2019-12-05: qty 250

## 2019-12-05 NOTE — Progress Notes (Signed)
Pt discharged in no apparent distress. Pt left ambulatory without assistance. Pt aware of discharge instructions and verbalized understanding and had no further questions.  

## 2019-12-08 ENCOUNTER — Inpatient Hospital Stay: Payer: BC Managed Care – PPO | Attending: Hematology & Oncology

## 2019-12-08 ENCOUNTER — Ambulatory Visit (INDEPENDENT_AMBULATORY_CARE_PROVIDER_SITE_OTHER): Payer: BC Managed Care – PPO

## 2019-12-08 ENCOUNTER — Ambulatory Visit (INDEPENDENT_AMBULATORY_CARE_PROVIDER_SITE_OTHER): Payer: BC Managed Care – PPO | Admitting: Cardiology

## 2019-12-08 ENCOUNTER — Other Ambulatory Visit: Payer: Self-pay

## 2019-12-08 ENCOUNTER — Encounter: Payer: Self-pay | Admitting: Cardiology

## 2019-12-08 VITALS — BP 120/100 | HR 53 | Ht 72.0 in | Wt 200.0 lb

## 2019-12-08 VITALS — BP 143/84 | HR 57 | Temp 98.5°F | Resp 16

## 2019-12-08 DIAGNOSIS — I10 Essential (primary) hypertension: Secondary | ICD-10-CM

## 2019-12-08 DIAGNOSIS — R002 Palpitations: Secondary | ICD-10-CM | POA: Diagnosis not present

## 2019-12-08 DIAGNOSIS — R42 Dizziness and giddiness: Secondary | ICD-10-CM | POA: Diagnosis not present

## 2019-12-08 DIAGNOSIS — D508 Other iron deficiency anemias: Secondary | ICD-10-CM

## 2019-12-08 DIAGNOSIS — E611 Iron deficiency: Secondary | ICD-10-CM | POA: Insufficient documentation

## 2019-12-08 MED ORDER — SODIUM CHLORIDE 0.9 % IV SOLN
Freq: Once | INTRAVENOUS | Status: AC
  Filled 2019-12-08: qty 250

## 2019-12-08 MED ORDER — SODIUM CHLORIDE 0.9 % IV SOLN
200.0000 mg | Freq: Once | INTRAVENOUS | Status: AC
  Administered 2019-12-08: 200 mg via INTRAVENOUS
  Filled 2019-12-08: qty 200

## 2019-12-08 NOTE — Progress Notes (Signed)
Pt discharged in no apparent distress. Pt left ambulatory without assistance. Pt aware of discharge instructions and verbalized understanding and had no further questions.  

## 2019-12-08 NOTE — Progress Notes (Signed)
Cardiology Office Note:    Date:  12/08/2019   ID:  Erik Weber, DOB 07/15/1975, MRN [id number]  PCP:  Darreld Mclean, MD  Cardiologist:  Berniece Salines, DO  Electrophysiologist:  None   Referring MD: Darreld Mclean, MD   Chief Complaint  Patient presents with  . Establish Care  . Bradycardia    Discovered during hernia repair/Dr. Edilia Bo referred    History of Present Illness:    Erik Weber is a 44 y.o. male with a hx of hypertension, chronic kidney disease stage II, mild sleep apnea presents today to be evaluated for palpitation and dizziness.  The patient reports that he had to see cardiology back in September due to bradycardia.  At that time it was only his EKG that was concerning due to bradycardia but he had no symptoms.  He would therefore evaluated with recommendations to go ahead and do his regular activities. Today he comes to be evaluated as he tells me now he is feeling significant lightheadedness and dizziness was most bothersome is the fact that he has been experiencing some palpitations.  He described as a intermittent fast heartbeat which then after he gets dizzy and notes that last Sunday he experienced significant episodes of this in yesterday he had a significant episode again.  He tells me his fiance who is a nurse was able to evaluate him and they both felt that these were skipped heartbeats.  He has not had any chest pain or shortness of breath.  He has not had any syncope episode.   Past Medical History:  Diagnosis Date  . Chronic renal insufficiency   . Fracture of base of fifth metacarpal bone of right hand   . GERD (gastroesophageal reflux disease)    hx of   . Hypertension   . Morbid obesity ([place])   . Pneumonitis 10/04/2018   after accidental inhalation of swimming pool water during swimmng lesson  . 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 Sleep apnea    Mild, no cpap needed    Past Surgical History:  Procedure Laterality Date  .  ADENOIDECTOMY     childhood  . 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 HAND SURGERY Right 02/2016  . UPPER GI ENDOSCOPY  03/10/2016  . XI ROBOTIC ASSISTED INGUINAL HERNIA REPAIR WITH MESH Left 10/10/2019   Procedure: XI ROBOTIC ASSISTED LEFT INGUINAL HERNIA REPAIR WITH MESH;  Surgeon: Erik Pickerel, MD;  Location: [facility];  Service: General;  Laterality: Left;    Current Medications: Current Meds  Medication Sig  . ferrous sulfate 325 (65 FE) MG tablet Take 325 mg by mouth daily with breakfast.  . Multiple Vitamins-Minerals (MULTIVITAMIN WITH MINERALS) tablet Take 1 tablet by mouth daily.     Allergies:   Patient has no known allergies.   Social History   Socioeconomic History  . Marital status: Single    Spouse name: Not on file  . Number of children: 1  . Years of education: 61  . Highest education level: Not on file  Occupational History  . Occupation: Heritage manager  Tobacco Use  . Smoking status: Never Smoker  . Smokeless tobacco: Never Used  Vaping Use  . Vaping Use: Never used  Substance and Sexual Activity  . Alcohol use: No    Alcohol/week: 0.0  standard drinks  . Drug use: No  . Sexual activity: Yes    Partners: Male  Other Topics Concern  . Not on file  Social History Narrative   Occupation: Works for lab   Single    Adopted daughter - 4   Moved from [place] 4 yrs ago.   Never Smoked   Alcohol use-no   Caffeine use/day:  None   Does Patient Exercise:  yes            Social Determinants of Health   Financial Resource Strain:   . Difficulty of Paying Living Expenses: Not on file  Food Insecurity:   . Worried About Charity fundraiser in the Last Year: Not on file  . Ran Out of Food in the Last Year: Not on file  Transportation Needs:   . Lack of Transportation (Medical): Not on file  . Lack  of Transportation (Non-Medical): Not on file  Physical Activity:   . Days of Exercise per Week: Not on file  . Minutes of Exercise per Session: Not on file  Stress:   . Feeling of Stress : Not on file  Social Connections:   . Frequency of Communication with Friends and Family: Not on file  . Frequency of Social Gatherings with Friends and Family: Not on file  . Attends Religious Services: Not on file  . Active Member of Clubs or Organizations: Not on file  . Attends Archivist Meetings: Not on file  . Marital Status: Not on file     Family History: The patient's family history includes Diabetes in his father; Hypertension in his father; Kidney cancer (age of onset: 39) in his mother; Lung cancer in his paternal uncle. There is no history of Colon cancer, Prostate cancer, Liver disease, Esophageal cancer, Stomach cancer, or Pancreatic cancer.  ROS:   Review of Systems  Constitution: Reports dizziness.  Negative for decreased appetite, fever and weight gain.  HENT: Negative for congestion, ear discharge, hoarse voice and sore throat.   Eyes: Negative for discharge, redness, vision loss in right eye and visual halos.  Cardiovascular: Reports palpitations.  Negative for chest pain, dyspnea on exertion, leg swelling, orthopnea. Respiratory: Negative for cough, hemoptysis, shortness of breath and snoring.   Endocrine: Negative for heat intolerance and polyphagia.  Hematologic/Lymphatic: Negative for bleeding problem. Does not bruise/bleed easily.  Skin: Negative for flushing, nail changes, rash and suspicious lesions.  Musculoskeletal: Negative for arthritis, joint pain, muscle cramps, myalgias, neck pain and stiffness.  Gastrointestinal: Negative for abdominal pain, bowel incontinence, diarrhea and excessive appetite.  Genitourinary: Negative for decreased libido, genital sores and incomplete emptying.  Neurological: Negative for brief paralysis, focal weakness, headaches and loss  of balance.  Psychiatric/Behavioral: Negative for altered mental status, depression and suicidal ideas.  Allergic/Immunologic: Negative for HIV exposure and persistent infections.    EKGs/Labs/Other Studies Reviewed:    The following studies were reviewed today:   EKG:  The ekg ordered today demonstrates sinus bradycardia, heart rate 53 bpm with first-degree AV block.  Compared to prior EKG patient is not bradycardic with first-degree AV block.  Recent Labs: 10/16/2019: TSH 1.000 12/03/2019: ALT 27; BUN 11; Creatinine 1.55; Hemoglobin 14.1; Platelet Count 285; Potassium 4.1; Sodium 142  Recent Lipid Panel    Component Value Date/Time   CHOL 155 11/14/2018 1559   TRIG 123.0 11/14/2018 1559   HDL 28.50 (L) 11/14/2018 1559   CHOLHDL 5 11/14/2018 1559   VLDL 24.6 11/14/2018 1559   LDLCALC  101 (H) 11/14/2018 1559   LDLDIRECT 95.0 08/08/2018 0822    Physical Exam:    VS:  BP (!) 120/100 (BP Location: Right Arm, Patient Position: Sitting, Cuff Size: Normal)   Pulse (!) 53   Ht 6' (1.829 m)   Wt 200 lb (90.7 kg)   SpO2 100%   BMI 27.12 kg/m     Wt Readings from Last 3 Encounters:  12/08/19 200 lb (90.7 kg)  12/03/19 200 lb 6.4 oz (90.9 kg)  11/12/19 195 lb (88.5 kg)     GEN: Well nourished, well developed in no acute distress HEENT: Normal NECK: No JVD; No carotid bruits LYMPHATICS: No lymphadenopathy CARDIAC: S1S2 noted,RRR, no murmurs, rubs, gallops RESPIRATORY:  Clear to auscultation without rales, wheezing or rhonchi  ABDOMEN: Soft, non-tender, non-distended, +bowel sounds, no guarding. EXTREMITIES: No edema, No cyanosis, no clubbing MUSCULOSKELETAL:  No deformity  SKIN: Warm and dry NEUROLOGIC:  Alert and oriented x 3, non-focal PSYCHIATRIC:  Normal affect, good insight  ASSESSMENT:    1. Palpitations   2. Dizziness   3. Essential hypertension    PLAN:     I would like to rule out a cardiovascular etiology of this palpitation, therefore at this time I would  like to placed a zio patch for  7 days. In additon a transthoracic echocardiogram will be ordered to assess LV/RV function and any structural abnormalities. Once these testing have been performed amd reviewed further reccomendations will be made. For now, I do reccomend that the patient goes to the nearest ED if  symptoms recur.  We discussed his diastolic hypertension.  I have educated patient about cutting back on salt and also monitoring his blood pressure.  Because if this persists we will have to consider antihypertensive medication.  He is agreeable with this.  The patient is in agreement with the above plan. The patient left the office in stable condition.  The patient will follow up in 3 months or sooner if needed.   Medication Adjustments/Labs and Tests Ordered: Current medicines are reviewed at length with the patient today.  Concerns regarding medicines are outlined above.  Orders Placed This Encounter  Procedures  . LONG TERM MONITOR (3-14 DAYS)  . EKG 12-Lead  . ECHOCARDIOGRAM COMPLETE   No orders of the defined types were placed in this encounter.   Patient Instructions  Medication Instructions:  Your physician recommends that you continue on your current medications as directed. Please refer to the Current Medication list given to you today.  *If you need a refill on your cardiac medications before your next appointment, please call your pharmacy*   Lab Work: None If you have labs (blood work) drawn today and your tests are completely normal, you will receive your results only by: Marland Kitchen MyChart Message (if you have MyChart) OR . A paper copy in the mail If you have any lab test that is abnormal or we need to change your treatment, we will call you to review the results.   Testing/Procedures: Your physician has requested that you have an echocardiogram. Echocardiography is a painless test that uses sound waves to create images of your heart. It provides your doctor with  information about the size and shape of your heart and how well your heart's chambers and valves are working. This procedure takes approximately one hour. There are no restrictions for this procedure.  A zio monitor was ordered today. It will remain on for 7 days. You will then return monitor and event diary  in provided box. It takes 1-2 weeks for report to be downloaded and returned to [place]. We will call you with the results. If monitor falls off or has orange flashing light, please call Zio for further instructions.      Follow-Up: At [facility], you and your health needs are our priority.  As part of our continuing mission to provide you with exceptional heart care, we have created designated Provider Care Teams.  These Care Teams include your primary Cardiologist (physician) and Advanced Practice Providers (APPs -  Physician Assistants and Nurse Practitioners) who all work together to provide you with the care you need, when you need it.  We recommend signing up for the patient portal called "MyChart".  Sign up information is provided on this After Visit Summary.  MyChart is used to connect with patients for Virtual Visits (Telemedicine).  Patients are able to view lab/test results, encounter notes, upcoming appointments, etc.  Non-urgent messages can be sent to your provider as well.   To learn more about what you can do with MyChart, go to [email or web address].    Your next appointment:   3 month(s)  The format for your next appointment:   In Person  Provider:   Berniece Salines, DO   Other Instructions   Echocardiogram An echocardiogram is a procedure that uses painless sound waves (ultrasound) to produce an image of the heart. Images from an echocardiogram can provide important information about:  Signs of coronary artery disease (CAD).  Aneurysm detection. An aneurysm is a weak or damaged part of an artery wall that bulges out from the normal force of blood pumping through the  body.  Heart size and shape. Changes in the size or shape of the heart can be associated with certain conditions, including heart failure, aneurysm, and CAD.  Heart muscle function.  Heart valve function.  Signs of a past heart attack.  Fluid buildup around the heart.  Thickening of the heart muscle.  A tumor or infectious growth around the heart valves. Tell a health care provider about:  Any allergies you have.  All medicines you are taking, including vitamins, herbs, eye drops, creams, and over-the-counter medicines.  Any blood disorders you have.  Any surgeries you have had.  Any medical conditions you have.  Whether you are pregnant or may be pregnant. What are the risks? Generally, this is a safe procedure. However, problems may occur, including:  Allergic reaction to dye (contrast) that may be used during the procedure. What happens before the procedure? No specific preparation is needed. You may eat and drink normally. What happens during the procedure?   An IV tube may be inserted into one of your veins.  You may receive contrast through this tube. A contrast is an injection that improves the quality of the pictures from your heart.  A gel will be applied to your chest.  A wand-like tool (transducer) will be moved over your chest. The gel will help to transmit the sound waves from the transducer.  The sound waves will harmlessly bounce off of your heart to allow the heart images to be captured in real-time motion. The images will be recorded on a computer. The procedure may vary among health care providers and hospitals. What happens after the procedure?  You may return to your normal, everyday life, including diet, activities, and medicines, unless your health care provider tells you not to do that. Summary  An echocardiogram is a procedure that uses painless sound waves (ultrasound)  to produce an image of the heart.  Images from an echocardiogram can  provide important information about the size and shape of your heart, heart muscle function, heart valve function, and fluid buildup around your heart.  You do not need to do anything to prepare before this procedure. You may eat and drink normally.  After the echocardiogram is completed, you may return to your normal, everyday life, unless your health care provider tells you not to do that. This information is not intended to replace advice given to you by your health care provider. Make sure you discuss any questions you have with your health care provider. Document Revised: 05/16/2018 Document Reviewed: 02/26/2016 Elsevier Patient Education  [employer].       Adopting a Healthy Lifestyle.  Know what a healthy weight is for you (roughly BMI <25) and aim to maintain this   Aim for 7+ servings of fruits and vegetables daily   65-80+ fluid ounces of water or unsweet tea for healthy kidneys   Limit to max 1 drink of alcohol per day; avoid smoking/tobacco   Limit animal fats in diet for cholesterol and heart health - choose grass fed whenever available   Avoid highly processed foods, and foods high in saturated/trans fats   Aim for low stress - take time to unwind and care for your mental health   Aim for 150 min of moderate intensity exercise weekly for heart health, and weights twice weekly for bone health   Aim for 7-9 hours of sleep daily   When it comes to diets, agreement about the perfect plan isnt easy to find, even among the experts. Experts at the [employer] developed an idea known as the Healthy Eating Plate. Just imagine a plate divided into logical, healthy portions.   The emphasis is on diet quality:   Load up on vegetables and fruits - one-half of your plate: Aim for color and variety, and remember that potatoes dont count.   Go for whole grains - one-quarter of your plate: Whole wheat, barley, wheat berries, quinoa, oats, brown rice,  and foods made with them. If you want pasta, go with whole wheat pasta.   Protein power - one-quarter of your plate: Fish, chicken, beans, and nuts are all healthy, versatile protein sources. Limit red meat.   The diet, however, does go beyond the plate, offering a few other suggestions.   Use healthy plant oils, such as olive, canola, soy, corn, sunflower and peanut. Check the labels, and avoid partially hydrogenated oil, which have unhealthy trans fats.   If youre thirsty, drink water. Coffee and tea are good in moderation, but skip sugary drinks and limit milk and dairy products to one or two daily servings.   The type of carbohydrate in the diet is more important than the amount. Some sources of carbohydrates, such as vegetables, fruits, whole grains, and beans-are healthier than others.   Finally, stay active  Signed, Berniece Salines, DO  12/08/2019 2:59 PM    [place]

## 2019-12-08 NOTE — Patient Instructions (Signed)
Medication Instructions:  Your physician recommends that you continue on your current medications as directed. Please refer to the Current Medication list given to you today.  *If you need a refill on your cardiac medications before your next appointment, please call your pharmacy*   Lab Work: None If you have labs (blood work) drawn today and your tests are completely normal, you will receive your results only by: Marland Kitchen MyChart Message (if you have MyChart) OR . A paper copy in the mail If you have any lab test that is abnormal or we need to change your treatment, we will call you to review the results.   Testing/Procedures: Your physician has requested that you have an echocardiogram. Echocardiography is a painless test that uses sound waves to create images of your heart. It provides your doctor with information about the size and shape of your heart and how well your heart's chambers and valves are working. This procedure takes approximately one hour. There are no restrictions for this procedure.  A zio monitor was ordered today. It will remain on for 7 days. You will then return monitor and event diary in provided box. It takes 1-2 weeks for report to be downloaded and returned to [place]. We will call you with the results. If monitor falls off or has orange flashing light, please call Zio for further instructions.      Follow-Up: At [facility], you and your health needs are our priority.  As part of our continuing mission to provide you with exceptional heart care, we have created designated Provider Care Teams.  These Care Teams include your primary Cardiologist (physician) and Advanced Practice Providers (APPs -  Physician Assistants and Nurse Practitioners) who all work together to provide you with the care you need, when you need it.  We recommend signing up for the patient portal called "MyChart".  Sign up information is provided on this After Visit Summary.  MyChart is used to connect  with patients for Virtual Visits (Telemedicine).  Patients are able to view lab/test results, encounter notes, upcoming appointments, etc.  Non-urgent messages can be sent to your provider as well.   To learn more about what you can do with MyChart, go to [email or web address].    Your next appointment:   3 month(s)  The format for your next appointment:   In Person  Provider:   Berniece Salines, DO   Other Instructions   Echocardiogram An echocardiogram is a procedure that uses painless sound waves (ultrasound) to produce an image of the heart. Images from an echocardiogram can provide important information about:  Signs of coronary artery disease (CAD).  Aneurysm detection. An aneurysm is a weak or damaged part of an artery wall that bulges out from the normal force of blood pumping through the body.  Heart size and shape. Changes in the size or shape of the heart can be associated with certain conditions, including heart failure, aneurysm, and CAD.  Heart muscle function.  Heart valve function.  Signs of a past heart attack.  Fluid buildup around the heart.  Thickening of the heart muscle.  A tumor or infectious growth around the heart valves. Tell a health care provider about:  Any allergies you have.  All medicines you are taking, including vitamins, herbs, eye drops, creams, and over-the-counter medicines.  Any blood disorders you have.  Any surgeries you have had.  Any medical conditions you have.  Whether you are pregnant or may be pregnant. What are the risks? Generally,  this is a safe procedure. However, problems may occur, including:  Allergic reaction to dye (contrast) that may be used during the procedure. What happens before the procedure? No specific preparation is needed. You may eat and drink normally. What happens during the procedure?   An IV tube may be inserted into one of your veins.  You may receive contrast through this tube. A contrast  is an injection that improves the quality of the pictures from your heart.  A gel will be applied to your chest.  A wand-like tool (transducer) will be moved over your chest. The gel will help to transmit the sound waves from the transducer.  The sound waves will harmlessly bounce off of your heart to allow the heart images to be captured in real-time motion. The images will be recorded on a computer. The procedure may vary among health care providers and hospitals. What happens after the procedure?  You may return to your normal, everyday life, including diet, activities, and medicines, unless your health care provider tells you not to do that. Summary  An echocardiogram is a procedure that uses painless sound waves (ultrasound) to produce an image of the heart.  Images from an echocardiogram can provide important information about the size and shape of your heart, heart muscle function, heart valve function, and fluid buildup around your heart.  You do not need to do anything to prepare before this procedure. You may eat and drink normally.  After the echocardiogram is completed, you may return to your normal, everyday life, unless your health care provider tells you not to do that. This information is not intended to replace advice given to you by your health care provider. Make sure you discuss any questions you have with your health care provider. Document Revised: 05/16/2018 Document Reviewed: 02/26/2016 Elsevier Patient Education  [place].

## 2019-12-08 NOTE — Patient Instructions (Signed)

## 2019-12-09 LAB — COLOGUARD
COLOGUARD: NEGATIVE
Cologuard: NEGATIVE

## 2019-12-09 LAB — EXTERNAL GENERIC LAB PROCEDURE: COLOGUARD: NEGATIVE

## 2019-12-10 ENCOUNTER — Inpatient Hospital Stay: Payer: BC Managed Care – PPO

## 2019-12-10 ENCOUNTER — Ambulatory Visit: Payer: BC Managed Care – PPO | Admitting: Family

## 2019-12-10 ENCOUNTER — Encounter: Payer: Self-pay | Admitting: Family Medicine

## 2019-12-10 ENCOUNTER — Other Ambulatory Visit: Payer: Self-pay

## 2019-12-10 ENCOUNTER — Other Ambulatory Visit: Payer: BC Managed Care – PPO

## 2019-12-10 ENCOUNTER — Ambulatory Visit: Payer: BC Managed Care – PPO

## 2019-12-10 VITALS — BP 133/96 | HR 57 | Temp 98.7°F | Resp 17

## 2019-12-10 DIAGNOSIS — D508 Other iron deficiency anemias: Secondary | ICD-10-CM

## 2019-12-10 DIAGNOSIS — E611 Iron deficiency: Secondary | ICD-10-CM | POA: Diagnosis not present

## 2019-12-10 MED ORDER — SODIUM CHLORIDE 0.9 % IV SOLN
Freq: Once | INTRAVENOUS | Status: AC
  Filled 2019-12-10: qty 250

## 2019-12-10 MED ORDER — SODIUM CHLORIDE 0.9 % IV SOLN
200.0000 mg | Freq: Once | INTRAVENOUS | Status: AC
  Administered 2019-12-10: 200 mg via INTRAVENOUS
  Filled 2019-12-10: qty 200

## 2019-12-10 NOTE — Patient Instructions (Signed)

## 2019-12-10 NOTE — Progress Notes (Signed)
Pt declined to stay for post infusion observation period. Pt stated he has tolerated medication multiple times prior without difficulty. Pt aware to call clinic with any questions or concerns. Pt verbalized understanding and had no further questions.  Pt discharged in stable condition.

## 2019-12-11 LAB — ALPHA-THALASSEMIA GENOTYPR

## 2019-12-18 ENCOUNTER — Encounter: Payer: Self-pay | Admitting: Family Medicine

## 2019-12-26 ENCOUNTER — Other Ambulatory Visit: Payer: Self-pay

## 2019-12-26 ENCOUNTER — Ambulatory Visit (HOSPITAL_BASED_OUTPATIENT_CLINIC_OR_DEPARTMENT_OTHER)
Admission: RE | Admit: 2019-12-26 | Discharge: 2019-12-26 | Disposition: A | Discharge status: Home / Self Care | Payer: BC Managed Care – PPO | Source: Ambulatory Visit | Attending: Cardiology | Admitting: Cardiology

## 2019-12-26 DIAGNOSIS — R002 Palpitations: Secondary | ICD-10-CM | POA: Insufficient documentation

## 2019-12-26 DIAGNOSIS — I361 Nonrheumatic tricuspid (valve) insufficiency: Secondary | ICD-10-CM | POA: Diagnosis not present

## 2019-12-26 LAB — ECHOCARDIOGRAM COMPLETE
Area-P 1/2: 2.96 cm2
S' Lateral: 3.35 cm

## 2019-12-29 ENCOUNTER — Telehealth: Payer: Self-pay

## 2019-12-29 NOTE — Telephone Encounter (Signed)
Spoke with patient regarding results and recommendation.  Patient verbalizes understanding and is agreeable to plan of care. Advised patient to call back with any issues or concerns.  

## 2019-12-29 NOTE — Telephone Encounter (Signed)
-----   Message from Berniece Salines, DO sent at 12/29/2019  9:00 AM EST ----- Echo normal

## 2020-01-09 ENCOUNTER — Telehealth: Payer: Self-pay

## 2020-01-09 MED ORDER — DILTIAZEM HCL 120 MG PO TABS: 120.0000 mg | ORAL_TABLET | Freq: Every day | ORAL | 3 refills | Status: DC

## 2020-01-09 NOTE — Telephone Encounter (Signed)
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  I would like to start you on Cardizem 120 mg daily to help with the PVCs, symptoms as well as this will also help with your blood pressure.  I can see you in 4 weeks

## 2020-01-09 NOTE — Telephone Encounter (Signed)
Spoke with patient regarding results and recommendation.  Patient verbalizes understanding and is agreeable to plan of care. Advised patient to call back with any issues or concerns.  

## 2020-01-28 ENCOUNTER — Encounter: Payer: Self-pay | Admitting: Cardiology

## 2020-01-28 ENCOUNTER — Other Ambulatory Visit: Payer: Self-pay

## 2020-01-28 ENCOUNTER — Ambulatory Visit (INDEPENDENT_AMBULATORY_CARE_PROVIDER_SITE_OTHER): Payer: BC Managed Care – PPO | Admitting: Cardiology

## 2020-01-28 VITALS — BP 140/82 | HR 70 | Ht 72.0 in | Wt 201.0 lb

## 2020-01-28 DIAGNOSIS — R072 Precordial pain: Secondary | ICD-10-CM

## 2020-01-28 DIAGNOSIS — Z9884 Bariatric surgery status: Secondary | ICD-10-CM

## 2020-01-28 DIAGNOSIS — N182 Chronic kidney disease, stage 2 (mild): Secondary | ICD-10-CM | POA: Diagnosis not present

## 2020-01-28 DIAGNOSIS — I10 Essential (primary) hypertension: Secondary | ICD-10-CM | POA: Diagnosis not present

## 2020-01-28 DIAGNOSIS — G4733 Obstructive sleep apnea (adult) (pediatric): Secondary | ICD-10-CM

## 2020-01-28 MED ORDER — METOPROLOL TARTRATE 100 MG PO TABS: 100.0000 mg | ORAL_TABLET | Freq: Once | ORAL | 0 refills | Status: DC

## 2020-01-28 MED ORDER — DILTIAZEM HCL ER COATED BEADS 180 MG PO CP24: 180.0000 mg | ORAL_CAPSULE | Freq: Every day | ORAL | 3 refills | Status: DC

## 2020-01-28 MED ORDER — NITROGLYCERIN 0.4 MG SL SUBL: 0.4000 mg | SUBLINGUAL_TABLET | SUBLINGUAL | 6 refills | Status: DC | PRN

## 2020-01-28 NOTE — Patient Instructions (Signed)
Medication Instructions:  Your physician has recommended you make the following change in your medication:   Increase Cardizem to 180 mg daily. Take Nitroglycerin as needed for chest pain.  *If you need a refill on your cardiac medications before your next appointment, please call your pharmacy*   Lab Work: Your physician recommends that you return for lab work in: 1 week prior to your CT scan. You can come Monday through Friday 8:30 am to 12:00 pm and 1:15 to 4:30. You do not need to make an appointment as the order has already been placed.   If you have labs (blood work) drawn today and your tests are completely normal, you will receive your results only by: Marland Kitchen MyChart Message (if you have MyChart) OR . A paper copy in the mail If you have any lab test that is abnormal or we need to change your treatment, we will call you to review the results.   Testing/Procedures: Your cardiac CT will be scheduled at:   [facility] [address] [place], [place] [address] [phone]  If scheduled at [facility], please arrive at the [facility] main entrance of [facility] [place] 30 minutes prior to test start time. Proceed to the [facility] Radiology Department (first floor) to check-in and test prep.  Please follow these instructions carefully (unless otherwise directed):  Hold all erectile dysfunction medications at least 3 days (72 hrs) prior to test.  On the Night Before the Test: . Be sure to Drink plenty of water. . Do not consume any caffeinated/decaffeinated beverages or chocolate 12 hours prior to your test. . Do not take any antihistamines 12 hours prior to your test.  On the Day of the Test: . Drink plenty of water. Do not drink any water within one hour of the test. . Do not eat any food 4 hours prior to the test. . You may take your regular medications prior to the test.  . Take metoprolol (Lopressor) two hours prior to test. .  After the  Test: . Drink plenty of water. . After receiving IV contrast, you may experience a mild flushed feeling. This is normal. . On occasion, you may experience a mild rash up to 24 hours after the test. This is not dangerous. If this occurs, you can take Benadryl 25 mg and increase your fluid intake. . If you experience trouble breathing, this can be serious. If it is severe call 911 IMMEDIATELY. If it is mild, please call our office.   Once we have confirmed authorization from your insurance company, we will call you to set up a date and time for your test. Based on how quickly your insurance processes prior authorizations requests, please allow up to 4 weeks to be contacted for scheduling your Cardiac CT appointment. Be advised that routine Cardiac CT appointments could be scheduled as many as 8 weeks after your provider has ordered it.  For non-scheduling related questions, please contact the cardiac imaging nurse navigator should you have any questions/concerns: Marchia Bond, Cardiac Imaging Nurse Navigator Burley Saver, Interim Cardiac Imaging Nurse [place] [facility] Direct Office Dial: [phone]   For scheduling needs, including cancellations and rescheduling, please call [place], [phone].     Follow-Up: At [facility], you and your health needs are our priority.  As part of our continuing mission to provide you with exceptional heart care, we have created designated Provider Care Teams.  These Care Teams include your primary  Cardiologist (physician) and Advanced Practice Providers (APPs -  Physician Assistants and Nurse Practitioners) who all work together to provide you with the care you need, when you need it.  We recommend signing up for the patient portal called "MyChart".  Sign up information is provided on this After Visit Summary.  MyChart is used to connect with patients for Virtual Visits (Telemedicine).  Patients are able to view lab/test  results, encounter notes, upcoming appointments, etc.  Non-urgent messages can be sent to your provider as well.   To learn more about what you can do with MyChart, go to [email or web address].    Your next appointment:   6 month(s)  The format for your next appointment:   In Person  Provider:   Berniece Salines, DO   Other Instructions  Cardiac CT Angiogram A cardiac CT angiogram is a procedure to look at the heart and the area around the heart. It may be done to help find the cause of chest pains or other symptoms of heart disease. During this procedure, a substance called contrast dye is injected into the blood vessels in the area to be checked. A large X-ray machine, called a CT scanner, then takes detailed pictures of the heart and the surrounding area. The procedure is also sometimes called a coronary CT angiogram, coronary artery scanning, or CTA. A cardiac CT angiogram allows the health care provider to see how well blood is flowing to and from the heart. The health care provider will be able to see if there are any problems, such as:  Blockage or narrowing of the coronary arteries in the heart.  Fluid around the heart.  Signs of weakness or disease in the muscles, valves, and tissues of the heart. Tell a health care provider about:  Any allergies you have. This is especially important if you have had a previous allergic reaction to contrast dye.  All medicines you are taking, including vitamins, herbs, eye drops, creams, and over-the-counter medicines.  Any blood disorders you have.  Any surgeries you have had.  Any medical conditions you have.  Whether you are pregnant or may be pregnant.  Any anxiety disorders, chronic pain, or other conditions you have that may increase your stress or prevent you from lying still. What are the risks? Generally, this is a safe procedure. However, problems may occur, including:  Bleeding.  Infection.  Allergic reactions to  medicines or dyes.  Damage to other structures or organs.  Kidney damage from the contrast dye that is used.  Increased risk of cancer from radiation exposure. This risk is low. Talk with your health care provider about: ? The risks and benefits of testing. ? How you can receive the lowest dose of radiation. What happens before the procedure?  Wear comfortable clothing and remove any jewelry, glasses, dentures, and hearing aids.  Follow instructions from your health care provider about eating and drinking. This may include: ? For 12 hours before the procedure -- avoid caffeine. This includes tea, coffee, soda, energy drinks, and diet pills. Drink plenty of water or other fluids that do not have caffeine in them. Being well hydrated can prevent complications. ? For 4-6 hours before the procedure -- stop eating and drinking. The contrast dye can cause nausea, but this is less likely if your stomach is empty.  Ask your health care provider about changing or stopping your regular medicines. This is especially important if you are taking diabetes medicines, blood thinners, or medicines to treat problems with  erections (erectile dysfunction). What happens during the procedure?   Hair on your chest may need to be removed so that small sticky patches called electrodes can be placed on your chest. These will transmit information that helps to monitor your heart during the procedure.  An IV will be inserted into one of your veins.  You might be given a medicine to control your heart rate during the procedure. This will help to ensure that good images are obtained.  You will be asked to lie on an exam table. This table will slide in and out of the CT machine during the procedure.  Contrast dye will be injected into the IV. You might feel warm, or you may get a metallic taste in your mouth.  You will be given a medicine called nitroglycerin. This will relax or dilate the arteries in your  heart.  The table that you are lying on will move into the CT machine tunnel for the scan.  The person running the machine will give you instructions while the scans are being done. You may be asked to: ? Keep your arms above your head. ? Hold your breath. ? Stay very still, even if the table is moving.  When the scanning is complete, you will be moved out of the machine.  The IV will be removed. The procedure may vary among health care providers and hospitals. What can I expect after the procedure? After your procedure, it is common to have:  A metallic taste in your mouth from the contrast dye.  A feeling of warmth.  A headache from the nitroglycerin. Follow these instructions at home:  Take over-the-counter and prescription medicines only as told by your health care provider.  If you are told, drink enough fluid to keep your urine pale yellow. This will help to flush the contrast dye out of your body.  Most people can return to their normal activities right after the procedure. Ask your health care provider what activities are safe for you.  It is up to you to get the results of your procedure. Ask your health care provider, or the department that is doing the procedure, when your results will be ready.  Keep all follow-up visits as told by your health care provider. This is important. Contact a health care provider if:  You have any symptoms of allergy to the contrast dye. These include: ? Shortness of breath. ? Rash or hives. ? A racing heartbeat. Summary  A cardiac CT angiogram is a procedure to look at the heart and the area around the heart. It may be done to help find the cause of chest pains or other symptoms of heart disease.  During this procedure, a large X-ray machine, called a CT scanner, takes detailed pictures of the heart and the surrounding area after a contrast dye has been injected into blood vessels in the area.  Ask your health care provider about  changing or stopping your regular medicines before the procedure. This is especially important if you are taking diabetes medicines, blood thinners, or medicines to treat erectile dysfunction.  If you are told, drink enough fluid to keep your urine pale yellow. This will help to flush the contrast dye out of your body. This information is not intended to replace advice given to you by your health care provider. Make sure you discuss any questions you have with your health care provider. Document Revised: 09/18/2018 Document Reviewed: 09/18/2018 Elsevier Patient Education  [place].  Nitroglycerin sublingual tablets What is this medicine? NITROGLYCERIN (nye troe GLI ser in) is a type of vasodilator. It relaxes blood vessels, increasing the blood and oxygen supply to your heart. This medicine is used to relieve chest pain caused by angina. It is also used to prevent chest pain before activities like climbing stairs, going outdoors in cold weather, or sexual activity. This medicine may be used for other purposes; ask your health care provider or pharmacist if you have questions. COMMON BRAND NAME(S): Nitroquick, Nitrostat, Nitrotab What should I tell my health care provider before I take this medicine? They need to know if you have any of these conditions:  anemia  head injury, recent stroke, or bleeding in the brain  liver disease  previous heart attack  an unusual or allergic reaction to nitroglycerin, other medicines, foods, dyes, or preservatives  pregnant or trying to get pregnant  breast-feeding How should I use this medicine? Take this medicine by mouth as needed. At the first sign of an angina attack (chest pain or tightness) place one tablet under your tongue. You can also take this medicine 5 to 10 minutes before an event likely to produce chest pain. Follow the directions on the prescription label. Let the tablet dissolve under the tongue. Do not swallow whole. Replace  the dose if you accidentally swallow it. It will help if your mouth is not dry. Saliva around the tablet will help it to dissolve more quickly. Do not eat or drink, smoke or chew tobacco while a tablet is dissolving. If you are not better within 5 minutes after taking ONE dose of nitroglycerin, call 9-1-1 immediately to seek emergency medical care. Do not take more than 3 nitroglycerin tablets over 15 minutes. If you take this medicine often to relieve symptoms of angina, your doctor or health care professional may provide you with different instructions to manage your symptoms. If symptoms do not go away after following these instructions, it is important to call 9-1-1 immediately. Do not take more than 3 nitroglycerin tablets over 15 minutes. Talk to your pediatrician regarding the use of this medicine in children. Special care may be needed. Overdosage: If you think you have taken too much of this medicine contact a poison control center or emergency room at once. NOTE: This medicine is only for you. Do not share this medicine with others. What if I miss a dose? This does not apply. This medicine is only used as needed. What may interact with this medicine? Do not take this medicine with any of the following medications:  certain migraine medicines like ergotamine and dihydroergotamine (DHE)  medicines used to treat erectile dysfunction like sildenafil, tadalafil, and vardenafil  riociguat This medicine may also interact with the following medications:  alteplase  aspirin  heparin  medicines for high blood pressure  medicines for mental depression  other medicines used to treat angina  phenothiazines like chlorpromazine, mesoridazine, prochlorperazine, thioridazine This list may not describe all possible interactions. Give your health care provider a list of all the medicines, herbs, non-prescription drugs, or dietary supplements you use. Also tell them if you smoke, drink alcohol,  or use illegal drugs. Some items may interact with your medicine. What should I watch for while using this medicine? Tell your doctor or health care professional if you feel your medicine is no longer working. Keep this medicine with you at all times. Sit or lie down when you take your medicine to prevent falling if you feel dizzy or faint  after using it. Try to remain calm. This will help you to feel better faster. If you feel dizzy, take several deep breaths and lie down with your feet propped up, or bend forward with your head resting between your knees. You may get drowsy or dizzy. Do not drive, use machinery, or do anything that needs mental alertness until you know how this drug affects you. Do not stand or sit up quickly, especially if you are an older patient. This reduces the risk of dizzy or fainting spells. Alcohol can make you more drowsy and dizzy. Avoid alcoholic drinks. Do not treat yourself for coughs, colds, or pain while you are taking this medicine without asking your doctor or health care professional for advice. Some ingredients may increase your blood pressure. What side effects may I notice from receiving this medicine? Side effects that you should report to your doctor or health care professional as soon as possible:  blurred vision  dry mouth  skin rash  sweating  the feeling of extreme pressure in the head  unusually weak or tired Side effects that usually do not require medical attention (report to your doctor or health care professional if they continue or are bothersome):  flushing of the face or neck  headache  irregular heartbeat, palpitations  nausea, vomiting This list may not describe all possible side effects. Call your doctor for medical advice about side effects. You may report side effects to FDA at 1-800-FDA-1088. Where should I keep my medicine? Keep out of the reach of children. Store at room temperature between 20 and 25 degrees C (68 and 77  degrees F). Store in [employer]. Protect from light and moisture. Keep tightly closed. Throw away any unused medicine after the expiration date. NOTE: This sheet is a summary. It may not cover all possible information. If you have questions about this medicine, talk to your doctor, pharmacist, or health care provider.  2020 Elsevier/Gold Standard (2012-11-21 17:57:36)

## 2020-01-28 NOTE — Progress Notes (Signed)
Cardiology Office Note:    Date:  01/28/2020   ID:  Erik Weber, DOB 20-Nov-1975, MRN [id number]  PCP:  Erik Mclean, MD  Cardiologist:  Erik Salines, DO  Electrophysiologist:  None   Referring MD: Erik Mclean, MD   " I am having some chest pain"  History of Present Illness:    Erik Weber is a 44 y.o. male with a hx of hypertension, chronic kidney disease stage II, mild sleep apnea is here today for follow-up visit.  Initially saw the patient on December 08, 2019 at that time he was experiencing some palpitations.  We place a monitor the patient as well as get an echocardiogram.  His monitor did show evidence of frequent PVCs which were symptomatic.  In the interim we started patient on Cardizem 120 mg daily.  He tells me he has had some improvement with the palpitations but he has had intermittent worsening chest pain.  Explains this as a dull sensation which last for few minutes and it resolved.  There is no associated shortness of breath but the symptoms are worsening.  He is concerned about this.  Past Medical History:  Diagnosis Date  . Chronic renal insufficiency   . CKD (chronic kidney disease) stage 3, GFR 30-59 ml/min (HCC) 11/25/2018   Managed by [facility] Dr Erik Weber.  Secondary to hypertensive nephropathy   . Disorder resulting from impaired renal function 05/01/2007   Thought due to hypertension, followed by Dr. Florene Weber    . Essential hypertension 09/21/2009   Qualifier: Diagnosis of  By: Erik Weber   . Fracture of base of fifth metacarpal bone of right hand   . GERD 04/04/2007   Qualifier: Diagnosis of  By: Erik Weber   . GERD (gastroesophageal reflux disease)    hx of   . Hypertension   . Hypertriglyceridemia 05/28/2012  . Hypogonadism male 06/07/2011  . Iron deficiency anemia secondary to inadequate dietary iron intake 12/04/2019  . Morbid obesity ([place])   . Obesity 08/18/2013  . OSA (obstructive sleep apnea) 10/28/2018  .  Pneumonitis 10/04/2018   after accidental inhalation of swimming pool water during swimmng lesson  . 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 Sleep apnea    Mild, no cpap needed  . Tinea versicolor 05/19/2013    Past Surgical History:  Procedure Laterality Date  . ADENOIDECTOMY     childhood  . 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 HAND SURGERY Right 02/2016  . UPPER GI ENDOSCOPY  03/10/2016  . XI ROBOTIC ASSISTED INGUINAL HERNIA REPAIR WITH MESH Left 10/10/2019   Procedure: XI ROBOTIC ASSISTED LEFT INGUINAL HERNIA REPAIR WITH MESH;  Surgeon: Erik Pickerel, MD;  Location: [facility];  Service: General;  Laterality: Left;    Current Medications: Current Meds  Medication Sig  . ferrous sulfate 325 (65 FE) MG tablet Take 325 mg by mouth daily with breakfast.  . Multiple Vitamins-Minerals (MULTIVITAMIN WITH MINERALS) tablet Take 1 tablet by mouth daily.  . [DISCONTINUED] diltiazem (CARDIZEM) 120 MG tablet Take 1 tablet (120 mg total) by mouth daily.     Allergies:   Patient has no known allergies.   Social History   Socioeconomic History  . Marital status: Single    Spouse name: Not on  file  . Number of children: 1  . Years of education: 47  . Highest education level: Not on file  Occupational History  . Occupation: [employer] manager  Tobacco Use  . Smoking status: Never Smoker  . Smokeless tobacco: Never Used  Vaping Use  . Vaping Use: Never used  Substance and Sexual Activity  . Alcohol use: No    Alcohol/week: 0.0 standard drinks  . Drug use: No  . Sexual activity: Yes    Partners: Male  Other Topics Concern  . Not on file  Social History Narrative   Occupation: Works for lab   Single    Adopted daughter - 4   Moved from [place] 4 yrs ago.   Never Smoked   Alcohol use-no    Caffeine use/day:  None   Does Patient Exercise:  yes            Social Determinants of Health   Financial Resource Strain: Not on file  Food Insecurity: Not on file  Transportation Needs: Not on file  Physical Activity: Not on file  Stress: Not on file  Social Connections: Not on file     Family History: The patient's family history includes Diabetes in his father; Hypertension in his father; Kidney cancer (age of onset: 45) in his mother; Lung cancer in his paternal uncle. There is no history of Colon cancer, Prostate cancer, Liver disease, Esophageal cancer, Stomach cancer, or Pancreatic cancer.  ROS:   Review of Systems  Constitution: Negative for decreased appetite, fever and weight gain.  HENT: Negative for congestion, ear discharge, hoarse voice and sore throat.   Eyes: Negative for discharge, redness, vision loss in right eye and visual halos.  Cardiovascular: Negative for chest pain, dyspnea on exertion, leg swelling, orthopnea and palpitations.  Respiratory: Negative for cough, hemoptysis, shortness of breath and snoring.   Endocrine: Negative for heat intolerance and polyphagia.  Hematologic/Lymphatic: Negative for bleeding problem. Does not bruise/bleed easily.  Skin: Negative for flushing, nail changes, rash and suspicious lesions.  Musculoskeletal: Negative for arthritis, joint pain, muscle cramps, myalgias, neck pain and stiffness.  Gastrointestinal: Negative for abdominal pain, bowel incontinence, diarrhea and excessive appetite.  Genitourinary: Negative for decreased libido, genital sores and incomplete emptying.  Neurological: Negative for brief paralysis, focal weakness, headaches and loss of balance.  Psychiatric/Behavioral: Negative for altered mental status, depression and suicidal ideas.  Allergic/Immunologic: Negative for HIV exposure and persistent infections.    EKGs/Labs/Other Studies Reviewed:    The following studies were reviewed today:   EKG:  None today  ZIO monitor The patient wore the monitor for 5 days 15 hours starting 12/08/2019. Indication: Palpitations  The minimum heart rate was 45 bpm, maximum heart rate was 175 bpm, and average heart rate was 79 bpm. Predominant underlying rhythm was Sinus Rhythm. First Degree AV Block was present.  Premature atrial complexes were rare less than 1%. Premature Ventricular complexes frequent (12.6%,80121). Ventricular Bigeminy and Trigeminy were present.  No pauses, No second or third degree AV block and no atrial fibrillation present. 14 patient triggered events 12 associated with premature ventricular complex.  Conclusion: This study is remarkable for symptomatic frequent premature ventricular complex.   TTE  IMPRESSIONS  1. Frequent PVC's. Left ventricular ejection fraction, by estimation, is 55 to 60%. The left ventricle has normal function. The left ventricle has  no regional wall motion abnormalities. There is mild concentric left ventricular hypertrophy. Left ventricular diastolic parameters were normal.  2. Right ventricular systolic function  is normal. The right ventricular size is normal. There is normal pulmonary artery systolic pressure.  3. The mitral valve is normal in structure. No evidence of mitral valve regurgitation. No evidence of mitral stenosis.  4. The aortic valve is tricuspid. Aortic valve regurgitation is not visualized. No aortic stenosis is present.  5. The inferior vena cava is normal in size with greater than 50% respiratory variability, suggesting right atrial pressure of 3 mmHg.   Recent Labs: 10/16/2019: TSH 1.000 12/03/2019: ALT 27; BUN 11; Creatinine 1.55; Hemoglobin 14.1; Platelet Count 285; Potassium 4.1; Sodium 142  Recent Lipid Panel    Component Value Date/Time   CHOL 155 11/14/2018 1559   TRIG 123.0 11/14/2018 1559   HDL 28.50 (L) 11/14/2018 1559   CHOLHDL 5 11/14/2018 1559   VLDL 24.6 11/14/2018 1559   LDLCALC 101 (H) 11/14/2018  1559   LDLDIRECT 95.0 08/08/2018 0822    Physical Exam:    VS:  BP 140/82   Pulse 70   Ht 6' (1.829 m)   Wt 201 lb (91.2 kg)   SpO2 98%   BMI 27.26 kg/m     Wt Readings from Last 3 Encounters:  01/28/20 201 lb (91.2 kg)  12/08/19 200 lb (90.7 kg)  12/03/19 200 lb 6.4 oz (90.9 kg)     GEN: Well nourished, well developed in no acute distress HEENT: Normal NECK: No JVD; No carotid bruits LYMPHATICS: No lymphadenopathy CARDIAC: S1S2 noted,RRR, no murmurs, rubs, gallops RESPIRATORY:  Clear to auscultation without rales, wheezing or rhonchi  ABDOMEN: Soft, non-tender, non-distended, +bowel sounds, no guarding. EXTREMITIES: No edema, No cyanosis, no clubbing MUSCULOSKELETAL:  No deformity  SKIN: Warm and dry NEUROLOGIC:  Alert and oriented x 3, non-focal PSYCHIATRIC:  Normal affect, good insight  ASSESSMENT:    1. Essential hypertension   2. OSA (obstructive sleep apnea)   3. Chronic renal impairment, stage 2 (mild)   4. S/P gastric bypass   5. Precordial pain    PLAN:    His blood pressure still is not at target and tells me he does have some palpitations I like to increase his Cardizem to 180 mg daily.  Hopefully this will help improve his blood pressure as well as his symptoms.  His chest pain is concerning he has risk factors.  We talked about various diagnostic testing Lucus Lambertson like to proceed with an ischemic evaluation in this patient   The patient is in agreement with the above plan. The patient left the office in stable condition.  The patient will follow up in 6 months or sooner if needed.   Medication Adjustments/Labs and Tests Ordered: Current medicines are reviewed at length with the patient today.  Concerns regarding medicines are outlined above.  Orders Placed This Encounter  Procedures  . CT CORONARY MORPH W/CTA COR W/SCORE W/CA W/CM &/OR WO/CM  . CT CORONARY FRACTIONAL FLOW RESERVE DATA PREP  . CT CORONARY FRACTIONAL FLOW RESERVE FLUID ANALYSIS   . Basic metabolic panel   Meds ordered this encounter  Medications  . diltiazem (CARDIZEM CD) 180 MG 24 hr capsule    Sig: Take 1 capsule (180 mg total) by mouth daily.    Dispense:  90 capsule    Refill:  3  . nitroGLYCERIN (NITROSTAT) 0.4 MG SL tablet    Sig: Place 1 tablet (0.4 mg total) under the tongue every 5 (five) minutes as needed.    Dispense:  25 tablet    Refill:  6  . metoprolol tartrate (  LOPRESSOR) 100 MG tablet    Sig: Take 1 tablet (100 mg total) by mouth once for 1 dose. Take 2 hours prior to your CT if your heart rate is greater than 55    Dispense:  1 tablet    Refill:  0    Patient Instructions  Medication Instructions:  Your physician has recommended you make the following change in your medication:   Increase Cardizem to 180 mg daily. Take Nitroglycerin as needed for chest pain.  *If you need a refill on your cardiac medications before your next appointment, please call your pharmacy*   Lab Work: Your physician recommends that you return for lab work in: 1 week prior to your CT scan. You can come Monday through Friday 8:30 am to 12:00 pm and 1:15 to 4:30. You do not need to make an appointment as the order has already been placed.   If you have labs (blood work) drawn today and your tests are completely normal, you will receive your results only by: Marland Kitchen MyChart Message (if you have MyChart) OR . A paper copy in the mail If you have any lab test that is abnormal or we need to change your treatment, we will call you to review the results.   Testing/Procedures: Your cardiac CT will be scheduled at:   [facility] [address] [place], [place] [address] [phone]  If scheduled at [facility], please arrive at the [facility] main entrance of [facility] 30 minutes prior to test start time. Proceed to the [facility] Radiology Department (first floor) to check-in and test prep.  Please follow these instructions  carefully (unless otherwise directed):  Hold all erectile dysfunction medications at least 3 days (72 hrs) prior to test.  On the Night Before the Test: . Be sure to Drink plenty of water. . Do not consume any caffeinated/decaffeinated beverages or chocolate 12 hours prior to your test. . Do not take any antihistamines 12 hours prior to your test.  On the Day of the Test: . Drink plenty of water. Do not drink any water within one hour of the test. . Do not eat any food 4 hours prior to the test. . You may take your regular medications prior to the test.  . Take metoprolol (Lopressor) two hours prior to test. .  After the Test: . Drink plenty of water. . After receiving IV contrast, you may experience a mild flushed feeling. This is normal. . On occasion, you may experience a mild rash up to 24 hours after the test. This is not dangerous. If this occurs, you can take Benadryl 25 mg and increase your fluid intake. . If you experience trouble breathing, this can be serious. If it is severe call 911 IMMEDIATELY. If it is mild, please call our office.   Once we have confirmed authorization from your insurance company, we will call you to set up a date and time for your test. Based on how quickly your insurance processes prior authorizations requests, please allow up to 4 weeks to be contacted for scheduling your Cardiac CT appointment. Be advised that routine Cardiac CT appointments could be scheduled as many as 8 weeks after your provider has ordered it.  For non-scheduling related questions, please contact the cardiac imaging nurse navigator should you have any questions/concerns: Marchia Bond, Cardiac Imaging Nurse Navigator Burley Saver, Interim Cardiac Imaging Nurse [place] [facility] Direct Office Dial: [phone]   For  scheduling needs, including cancellations and rescheduling, please call [place], [phone].     Follow-Up: At [facility], you  and your health needs are our priority.  As part of our continuing mission to provide you with exceptional heart care, we have created designated Provider Care Teams.  These Care Teams include your primary Cardiologist (physician) and Advanced Practice Providers (APPs -  Physician Assistants and Nurse Practitioners) who all work together to provide you with the care you need, when you need it.  We recommend signing up for the patient portal called "MyChart".  Sign up information is provided on this After Visit Summary.  MyChart is used to connect with patients for Virtual Visits (Telemedicine).  Patients are able to view lab/test results, encounter notes, upcoming appointments, etc.  Non-urgent messages can be sent to your provider as well.   To learn more about what you can do with MyChart, go to [email or web address].    Your next appointment:   6 month(s)  The format for your next appointment:   In Person  Provider:   Berniece Salines, DO   Other Instructions  Cardiac CT Angiogram A cardiac CT angiogram is a procedure to look at the heart and the area around the heart. It may be done to help find the cause of chest pains or other symptoms of heart disease. During this procedure, a substance called contrast dye is injected into the blood vessels in the area to be checked. A large X-ray machine, called a CT scanner, then takes detailed pictures of the heart and the surrounding area. The procedure is also sometimes called a coronary CT angiogram, coronary artery scanning, or CTA. A cardiac CT angiogram allows the health care provider to see how well blood is flowing to and from the heart. The health care provider will be able to see if there are any problems, such as:  Blockage or narrowing of the coronary arteries in the heart.  Fluid around the heart.  Signs of weakness or disease in the muscles, valves, and tissues of the heart. Tell a health care provider about:  Any allergies you have.  This is especially important if you have had a previous allergic reaction to contrast dye.  All medicines you are taking, including vitamins, herbs, eye drops, creams, and over-the-counter medicines.  Any blood disorders you have.  Any surgeries you have had.  Any medical conditions you have.  Whether you are pregnant or may be pregnant.  Any anxiety disorders, chronic pain, or other conditions you have that may increase your stress or prevent you from lying still. What are the risks? Generally, this is a safe procedure. However, problems may occur, including:  Bleeding.  Infection.  Allergic reactions to medicines or dyes.  Damage to other structures or organs.  Kidney damage from the contrast dye that is used.  Increased risk of cancer from radiation exposure. This risk is low. Talk with your health care provider about: ? The risks and benefits of testing. ? How you can receive the lowest dose of radiation. What happens before the procedure?  Wear comfortable clothing and remove any jewelry, glasses, dentures, and hearing aids.  Follow instructions from your health care provider about eating and drinking. This may include: ? For 12 hours before the procedure -- avoid caffeine. This includes tea, coffee, soda, energy drinks, and diet pills. Drink plenty of water or other fluids that do not have caffeine in them. Being well hydrated can prevent complications. ? For 4-6 hours before  the procedure -- stop eating and drinking. The contrast dye can cause nausea, but this is less likely if your stomach is empty.  Ask your health care provider about changing or stopping your regular medicines. This is especially important if you are taking diabetes medicines, blood thinners, or medicines to treat problems with erections (erectile dysfunction). What happens during the procedure?   Hair on your chest may need to be removed so that small sticky patches called electrodes can be placed  on your chest. These will transmit information that helps to monitor your heart during the procedure.  An IV will be inserted into one of your veins.  You might be given a medicine to control your heart rate during the procedure. This will help to ensure that good images are obtained.  You will be asked to lie on an exam table. This table will slide in and out of the CT machine during the procedure.  Contrast dye will be injected into the IV. You might feel warm, or you may get a metallic taste in your mouth.  You will be given a medicine called nitroglycerin. This will relax or dilate the arteries in your heart.  The table that you are lying on will move into the CT machine tunnel for the scan.  The person running the machine will give you instructions while the scans are being done. You may be asked to: ? Keep your arms above your head. ? Hold your breath. ? Stay very still, even if the table is moving.  When the scanning is complete, you will be moved out of the machine.  The IV will be removed. The procedure may vary among health care providers and hospitals. What can I expect after the procedure? After your procedure, it is common to have:  A metallic taste in your mouth from the contrast dye.  A feeling of warmth.  A headache from the nitroglycerin. Follow these instructions at home:  Take over-the-counter and prescription medicines only as told by your health care provider.  If you are told, drink enough fluid to keep your urine pale yellow. This will help to flush the contrast dye out of your body.  Most people can return to their normal activities right after the procedure. Ask your health care provider what activities are safe for you.  It is up to you to get the results of your procedure. Ask your health care provider, or the department that is doing the procedure, when your results will be ready.  Keep all follow-up visits as told by your health care provider.  This is important. Contact a health care provider if:  You have any symptoms of allergy to the contrast dye. These include: ? Shortness of breath. ? Rash or hives. ? A racing heartbeat. Summary  A cardiac CT angiogram is a procedure to look at the heart and the area around the heart. It may be done to help find the cause of chest pains or other symptoms of heart disease.  During this procedure, a large X-ray machine, called a CT scanner, takes detailed pictures of the heart and the surrounding area after a contrast dye has been injected into blood vessels in the area.  Ask your health care provider about changing or stopping your regular medicines before the procedure. This is especially important if you are taking diabetes medicines, blood thinners, or medicines to treat erectile dysfunction.  If you are told, drink enough fluid to keep your urine pale yellow. This  will help to flush the contrast dye out of your body. This information is not intended to replace advice given to you by your health care provider. Make sure you discuss any questions you have with your health care provider. Document Revised: 09/18/2018 Document Reviewed: 09/18/2018 Elsevier Patient Education  [place].  Nitroglycerin sublingual tablets What is this medicine? NITROGLYCERIN (nye troe GLI ser in) is a type of vasodilator. It relaxes blood vessels, increasing the blood and oxygen supply to your heart. This medicine is used to relieve chest pain caused by angina. It is also used to prevent chest pain before activities like climbing stairs, going outdoors in cold weather, or sexual activity. This medicine may be used for other purposes; ask your health care provider or pharmacist if you have questions. COMMON BRAND NAME(S): Nitroquick, Nitrostat, Nitrotab What should I tell my health care provider before I take this medicine? They need to know if you have any of these conditions:  anemia  head injury,  recent stroke, or bleeding in the brain  liver disease  previous heart attack  an unusual or allergic reaction to nitroglycerin, other medicines, foods, dyes, or preservatives  pregnant or trying to get pregnant  breast-feeding How should I use this medicine? Take this medicine by mouth as needed. At the first sign of an angina attack (chest pain or tightness) place one tablet under your tongue. You can also take this medicine 5 to 10 minutes before an event likely to produce chest pain. Follow the directions on the prescription label. Let the tablet dissolve under the tongue. Do not swallow whole. Replace the dose if you accidentally swallow it. It will help if your mouth is not dry. Saliva around the tablet will help it to dissolve more quickly. Do not eat or drink, smoke or chew tobacco while a tablet is dissolving. If you are not better within 5 minutes after taking ONE dose of nitroglycerin, call 9-1-1 immediately to seek emergency medical care. Do not take more than 3 nitroglycerin tablets over 15 minutes. If you take this medicine often to relieve symptoms of angina, your doctor or health care professional may provide you with different instructions to manage your symptoms. If symptoms do not go away after following these instructions, it is important to call 9-1-1 immediately. Do not take more than 3 nitroglycerin tablets over 15 minutes. Talk to your pediatrician regarding the use of this medicine in children. Special care may be needed. Overdosage: If you think you have taken too much of this medicine contact a poison control center or emergency room at once. NOTE: This medicine is only for you. Do not share this medicine with others. What if I miss a dose? This does not apply. This medicine is only used as needed. What may interact with this medicine? Do not take this medicine with any of the following medications:  certain migraine medicines like ergotamine and dihydroergotamine  (DHE)  medicines used to treat erectile dysfunction like sildenafil, tadalafil, and vardenafil  riociguat This medicine may also interact with the following medications:  alteplase  aspirin  heparin  medicines for high blood pressure  medicines for mental depression  other medicines used to treat angina  phenothiazines like chlorpromazine, mesoridazine, prochlorperazine, thioridazine This list may not describe all possible interactions. Give your health care provider a list of all the medicines, herbs, non-prescription drugs, or dietary supplements you use. Also tell them if you smoke, drink alcohol, or use illegal drugs. Some items may interact  with your medicine. What should I watch for while using this medicine? Tell your doctor or health care professional if you feel your medicine is no longer working. Keep this medicine with you at all times. Sit or lie down when you take your medicine to prevent falling if you feel dizzy or faint after using it. Try to remain calm. This will help you to feel better faster. If you feel dizzy, take several deep breaths and lie down with your feet propped up, or bend forward with your head resting between your knees. You may get drowsy or dizzy. Do not drive, use machinery, or do anything that needs mental alertness until you know how this drug affects you. Do not stand or sit up quickly, especially if you are an older patient. This reduces the risk of dizzy or fainting spells. Alcohol can make you more drowsy and dizzy. Avoid alcoholic drinks. Do not treat yourself for coughs, colds, or pain while you are taking this medicine without asking your doctor or health care professional for advice. Some ingredients may increase your blood pressure. What side effects may I notice from receiving this medicine? Side effects that you should report to your doctor or health care professional as soon as possible:  blurred vision  dry mouth  skin  rash  sweating  the feeling of extreme pressure in the head  unusually weak or tired Side effects that usually do not require medical attention (report to your doctor or health care professional if they continue or are bothersome):  flushing of the face or neck  headache  irregular heartbeat, palpitations  nausea, vomiting This list may not describe all possible side effects. Call your doctor for medical advice about side effects. You may report side effects to FDA at 1-800-FDA-1088. Where should I keep my medicine? Keep out of the reach of children. Store at room temperature between 20 and 25 degrees C (68 and 77 degrees F). Store in [employer]. Protect from light and moisture. Keep tightly closed. Throw away any unused medicine after the expiration date. NOTE: This sheet is a summary. It may not cover all possible information. If you have questions about this medicine, talk to your doctor, pharmacist, or health care provider.  2020 Elsevier/Gold Standard (2012-11-21 17:57:36)     Adopting a Healthy Lifestyle.  Know what a healthy weight is for you (roughly BMI <25) and aim to maintain this   Aim for 7+ servings of fruits and vegetables daily   65-80+ fluid ounces of water or unsweet tea for healthy kidneys   Limit to max 1 drink of alcohol per day; avoid smoking/tobacco   Limit animal fats in diet for cholesterol and heart health - choose grass fed whenever available   Avoid highly processed foods, and foods high in saturated/trans fats   Aim for low stress - take time to unwind and care for your mental health   Aim for 150 min of moderate intensity exercise weekly for heart health, and weights twice weekly for bone health   Aim for 7-9 hours of sleep daily   When it comes to diets, agreement about the perfect plan isnt easy to find, even among the experts. Experts at the [employer] developed an idea known as the Healthy Eating Plate.  Just imagine a plate divided into logical, healthy portions.   The emphasis is on diet quality:   Load up on vegetables and fruits - one-half of your plate: Aim for color  and variety, and remember that potatoes dont count.   Go for whole grains - one-quarter of your plate: Whole wheat, barley, wheat berries, quinoa, oats, brown rice, and foods made with them. If you want pasta, go with whole wheat pasta.   Protein power - one-quarter of your plate: Fish, chicken, beans, and nuts are all healthy, versatile protein sources. Limit red meat.   The diet, however, does go beyond the plate, offering a few other suggestions.   Use healthy plant oils, such as olive, canola, soy, corn, sunflower and peanut. Check the labels, and avoid partially hydrogenated oil, which have unhealthy trans fats.   If youre thirsty, drink water. Coffee and tea are good in moderation, but skip sugary drinks and limit milk and dairy products to one or two daily servings.   The type of carbohydrate in the diet is more important than the amount. Some sources of carbohydrates, such as vegetables, fruits, whole grains, and beans-are healthier than others.   Finally, stay active  Signed, Erik Salines, DO  01/28/2020 8:54 AM    [facility]

## 2020-02-14 ENCOUNTER — Encounter: Payer: Self-pay | Admitting: Family Medicine

## 2020-03-10 ENCOUNTER — Telehealth (HOSPITAL_COMMUNITY): Payer: Self-pay | Admitting: Emergency Medicine

## 2020-03-10 NOTE — Telephone Encounter (Signed)
Calling patient to review CCTA instructions however patient requesting to r/s  New appt made for 03/18/20 at [facility] [phone] Office  [phone] Cell

## 2020-03-12 ENCOUNTER — Telehealth: Payer: Self-pay | Admitting: Cardiology

## 2020-03-12 ENCOUNTER — Ambulatory Visit (HOSPITAL_COMMUNITY): Payer: BC Managed Care – PPO

## 2020-03-12 LAB — BASIC METABOLIC PANEL
BUN/Creatinine Ratio: 6 — ABNORMAL LOW (ref 9–20)
BUN: 9 mg/dL (ref 6–24)
CO2: 20 mmol/L (ref 20–29)
Calcium: 9.3 mg/dL (ref 8.7–10.2)
Chloride: 105 mmol/L (ref 96–106)
Creatinine, Ser: 1.5 mg/dL — ABNORMAL HIGH (ref 0.76–1.27)
GFR calc Af Amer: 64 mL/min/{1.73_m2} (ref >59)
GFR calc non Af Amer: 55 mL/min/{1.73_m2} — ABNORMAL LOW (ref >59)
Glucose: 92 mg/dL (ref 65–99)
Potassium: 4.2 mmol/L (ref 3.5–5.2)
Sodium: 142 mmol/L (ref 134–144)

## 2020-03-12 NOTE — Telephone Encounter (Signed)
    Pt is returning Candice's call for his lab result

## 2020-03-16 ENCOUNTER — Ambulatory Visit: Payer: BC Managed Care – PPO | Admitting: Cardiology

## 2020-03-17 ENCOUNTER — Telehealth (HOSPITAL_COMMUNITY): Payer: Self-pay | Admitting: Emergency Medicine

## 2020-03-17 NOTE — Telephone Encounter (Signed)
Reaching out to patient to offer assistance regarding upcoming cardiac imaging study; pt verbalizes understanding of appt date/time, parking situation and where to check in, pre-test NPO status and medications ordered, and verified current allergies; name and call back number provided for further questions should they arise Marchia Bond RN Navigator Cardiac Imaging [facility] [phone] office [phone] cell  Pt to take 170m metoprolol tartrate PTA

## 2020-03-18 ENCOUNTER — Other Ambulatory Visit: Payer: Self-pay

## 2020-03-18 ENCOUNTER — Ambulatory Visit (HOSPITAL_COMMUNITY)
Admission: RE | Admit: 2020-03-18 | Discharge: 2020-03-18 | Disposition: A | Discharge status: Home / Self Care | Payer: BC Managed Care – PPO | Source: Ambulatory Visit | Attending: Cardiology | Admitting: Cardiology

## 2020-03-18 ENCOUNTER — Encounter (HOSPITAL_COMMUNITY): Payer: Self-pay

## 2020-03-18 DIAGNOSIS — R072 Precordial pain: Secondary | ICD-10-CM | POA: Diagnosis not present

## 2020-03-18 DIAGNOSIS — Z006 Encounter for examination for normal comparison and control in clinical research program: Secondary | ICD-10-CM

## 2020-03-18 NOTE — Research (Signed)
IDENTIFY Informed Consent                  Subject Name: Erik Weber    Subject met inclusion and exclusion criteria.  The informed consent form, study requirements and expectations were reviewed with the subject and questions and concerns were addressed prior to the signing of the consent form.  The subject verbalized understanding of the trial requirements.  The subject agreed to participate in the IDENTIFY trial and signed the informed consent at 1030AM on 03/18/20.  The informed consent was obtained prior to performance of any protocol-specific procedures for the subject.  A copy of the signed informed consent was given to the subject and a copy was placed in the subject's medical record.   Meade Maw, Naval architect

## 2020-03-18 NOTE — Progress Notes (Signed)
Pt arrived to radiology for CT. 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 This was confirmed with a 12 lead EKG. Pt asymptomatic otherwise. Dr. Harriet Masson notified and she spoke with pt to explain plan of care. CT test not done.

## 2020-03-30 ENCOUNTER — Other Ambulatory Visit: Payer: Self-pay

## 2020-03-30 DIAGNOSIS — R0789 Other chest pain: Secondary | ICD-10-CM

## 2020-03-30 DIAGNOSIS — I10 Essential (primary) hypertension: Secondary | ICD-10-CM

## 2020-04-06 ENCOUNTER — Telehealth (HOSPITAL_COMMUNITY): Payer: Self-pay | Admitting: Emergency Medicine

## 2020-04-06 ENCOUNTER — Other Ambulatory Visit: Payer: Self-pay

## 2020-04-06 ENCOUNTER — Other Ambulatory Visit: Payer: Self-pay | Admitting: Cardiology

## 2020-04-06 ENCOUNTER — Encounter (HOSPITAL_COMMUNITY): Payer: Self-pay

## 2020-04-06 DIAGNOSIS — D509 Iron deficiency anemia, unspecified: Secondary | ICD-10-CM

## 2020-04-06 NOTE — Telephone Encounter (Signed)
VM box full and cannot leave new message  Marchia Bond RN Navigator Cardiac Imaging [facility] [phone] Office  [phone] Cell

## 2020-04-06 NOTE — Telephone Encounter (Signed)
Reaching out to patient to offer assistance regarding upcoming cardiac imaging study; pt verbalizes understanding of appt date/time, parking situation and where to check in, pre-test NPO status and medications ordered, and verified current allergies; name and call back number provided for further questions should they arise Erik Bond RN Navigator Cardiac Imaging [facility] [phone] office [phone] cell  Pt to take daily medications as his last attempt at CCTA with metoprolol caused a 2nd degree heart block. Pt verbalized understanding Clarise Cruz

## 2020-04-06 NOTE — Progress Notes (Signed)
Spoke with patient. He states he had iron infusions in Nov and has not had levels checked since than and wanted to have them checked. But he is not experiencing any symptoms at this time. Message relayed to Dr. Harriet Masson. Order placed to check iron levels.

## 2020-04-07 LAB — IRON AND TIBC
Iron Saturation: 36 % (ref 15–55)
Iron: 91 ug/dL (ref 38–169)
Total Iron Binding Capacity: 252 ug/dL (ref 250–450)
UIBC: 161 ug/dL (ref 111–343)

## 2020-04-07 LAB — BASIC METABOLIC PANEL
BUN/Creatinine Ratio: 8 — ABNORMAL LOW (ref 9–20)
BUN: 12 mg/dL (ref 6–24)
CO2: 22 mmol/L (ref 20–29)
Calcium: 9.5 mg/dL (ref 8.7–10.2)
Chloride: 101 mmol/L (ref 96–106)
Creatinine, Ser: 1.42 mg/dL — ABNORMAL HIGH (ref 0.76–1.27)
Glucose: 88 mg/dL (ref 65–99)
Potassium: 4.2 mmol/L (ref 3.5–5.2)
Sodium: 141 mmol/L (ref 134–144)
eGFR: 62 mL/min/{1.73_m2} (ref >59)

## 2020-04-07 LAB — MAGNESIUM: Magnesium: 2.4 mg/dL — ABNORMAL HIGH (ref 1.6–2.3)

## 2020-04-08 ENCOUNTER — Other Ambulatory Visit: Payer: Self-pay

## 2020-04-08 ENCOUNTER — Ambulatory Visit (HOSPITAL_COMMUNITY)
Admission: RE | Admit: 2020-04-08 | Discharge: 2020-04-08 | Disposition: A | Discharge status: Home / Self Care | Payer: BC Managed Care – PPO | Source: Ambulatory Visit | Attending: Cardiology | Admitting: Cardiology

## 2020-04-08 DIAGNOSIS — R0789 Other chest pain: Secondary | ICD-10-CM | POA: Insufficient documentation

## 2020-04-08 DIAGNOSIS — I10 Essential (primary) hypertension: Secondary | ICD-10-CM | POA: Diagnosis not present

## 2020-04-08 MED ORDER — IOHEXOL 350 MG/ML SOLN
80.0000 mL | Freq: Once | INTRAVENOUS | Status: AC | PRN
  Administered 2020-04-08: 80 mL via INTRAVENOUS

## 2020-04-08 MED ORDER — NITROGLYCERIN 0.4 MG SL SUBL: 0.8000 mg | SUBLINGUAL_TABLET | Freq: Once | SUBLINGUAL | Status: AC

## 2020-04-08 MED ORDER — NITROGLYCERIN 0.4 MG SL SUBL
SUBLINGUAL_TABLET | SUBLINGUAL | Status: AC
  Administered 2020-04-08: 0.8 mg via SUBLINGUAL
  Filled 2020-04-08: qty 2

## 2020-04-09 ENCOUNTER — Telehealth: Payer: Self-pay | Admitting: Cardiology

## 2020-04-09 NOTE — Telephone Encounter (Signed)
Pt states that he spoke with you last pm and he would like to know if you have any recommendations for how to "keep it from dropping."

## 2020-04-09 NOTE — Telephone Encounter (Signed)
I was able to speak to the patient about his heart rate dropping.  He tells me when he was at CT yesterday he dropped down to 30 bpm.  He does not have any lightheadedness no dizziness.  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  Shared decision he is going to monitor his heart rate with his Apple Watch and report this to me over the next week.  If this is dropping and he feels symptomatic the plan will be to cut back on his Cardizem.  But for now since he is having symptomatic relief he preferred to stay on this medication and the current dosing.

## 2020-04-09 NOTE — Telephone Encounter (Signed)
Patient is requesting to speak with Dr. Terrial Rhodes nurse. He states he reviewed his CT results He would like to know if Dr. Harriet Masson has a recommendation. Please advise.

## 2020-04-15 DIAGNOSIS — E663 Overweight: Secondary | ICD-10-CM | POA: Diagnosis not present

## 2020-04-15 DIAGNOSIS — Z8639 Personal history of other endocrine, nutritional and metabolic disease: Secondary | ICD-10-CM | POA: Diagnosis not present

## 2020-04-15 DIAGNOSIS — I493 Ventricular premature depolarization: Secondary | ICD-10-CM | POA: Diagnosis not present

## 2020-04-15 DIAGNOSIS — Z9884 Bariatric surgery status: Secondary | ICD-10-CM | POA: Diagnosis not present

## 2020-04-19 ENCOUNTER — Telehealth: Payer: Self-pay | Admitting: Cardiology

## 2020-04-19 NOTE — Telephone Encounter (Signed)
    Pt would like to see Dr. Curt Bears and would like to request from Dr. Harriet Masson if she can send a referral for him

## 2020-04-20 ENCOUNTER — Telehealth: Payer: Self-pay

## 2020-04-20 NOTE — Telephone Encounter (Signed)
Spoke with patient, see telephone encounter.

## 2020-04-20 NOTE — Telephone Encounter (Signed)
Spoke with patient in regards to being referred to Dr. Ileana Ladd per his surgeon's request. Patient added to schedule for 04/30/2020 at 10 am to discuss with Dr. Harriet Masson. Patient willing to come in to discuss, no questions expressed at this time.

## 2020-04-27 ENCOUNTER — Other Ambulatory Visit: Payer: Self-pay

## 2020-04-30 ENCOUNTER — Other Ambulatory Visit: Payer: Self-pay

## 2020-04-30 ENCOUNTER — Ambulatory Visit (INDEPENDENT_AMBULATORY_CARE_PROVIDER_SITE_OTHER): Payer: BC Managed Care – PPO | Admitting: Cardiology

## 2020-04-30 ENCOUNTER — Encounter: Payer: Self-pay | Admitting: Cardiology

## 2020-04-30 ENCOUNTER — Ambulatory Visit (INDEPENDENT_AMBULATORY_CARE_PROVIDER_SITE_OTHER): Payer: BC Managed Care – PPO

## 2020-04-30 VITALS — BP 122/80 | HR 57 | Ht 72.0 in | Wt 205.4 lb

## 2020-04-30 DIAGNOSIS — R001 Bradycardia, unspecified: Secondary | ICD-10-CM | POA: Diagnosis not present

## 2020-04-30 DIAGNOSIS — I493 Ventricular premature depolarization: Secondary | ICD-10-CM

## 2020-04-30 MED ORDER — DILTIAZEM HCL ER COATED BEADS 180 MG PO CP24: 180.0000 mg | ORAL_CAPSULE | Freq: Every day | ORAL | 3 refills | Status: DC

## 2020-04-30 MED ORDER — DILTIAZEM HCL ER COATED BEADS 120 MG PO CP24: 120.0000 mg | ORAL_CAPSULE | Freq: Every day | ORAL | 3 refills | Status: DC

## 2020-04-30 NOTE — Patient Instructions (Signed)
Medication Instructions:  Your physician has recommended you make the following change in your medication:  DECREASE:  Cardizem 120 mg once daily *If you need a refill on your cardiac medications before your next appointment, please call your pharmacy*   Lab Work: None If you have labs (blood work) drawn today and your tests are completely normal, you will receive your results only by: Marland Kitchen MyChart Message (if you have MyChart) OR . A paper copy in the mail If you have any lab test that is abnormal or we need to change your treatment, we will call you to review the results.   Testing/Procedures: A zio monitor was ordered today. It will remain on for 3 days. You will then return monitor and event diary in provided box. It takes 1-2 weeks for report to be downloaded and returned to [place]. We will call you with the results. If monitor falls off or has orange flashing light, please call Zio for further instructions.      Follow-Up: At [facility], you and your health needs are our priority.  As part of our continuing mission to provide you with exceptional heart care, we have created designated Provider Care Teams.  These Care Teams include your primary Cardiologist (physician) and Advanced Practice Providers (APPs -  Physician Assistants and Nurse Practitioners) who all work together to provide you with the care you need, when you need it.  We recommend signing up for the patient portal called "MyChart".  Sign up information is provided on this After Visit Summary.  MyChart is used to connect with patients for Virtual Visits (Telemedicine).  Patients are able to view lab/test results, encounter notes, upcoming appointments, etc.  Non-urgent messages can be sent to your provider as well.   To learn more about what you can do with MyChart, go to [email or web address].    Your next appointment:   6 month(s)  The format for your next appointment:   In Person  Provider:   Berniece Salines,  DO   Other Instructions

## 2020-04-30 NOTE — Progress Notes (Signed)
Cardiology Office Note:    Date:  04/30/2020   ID:  Erik Weber, DOB [birth year], MRN [id number]  PCP:  Darreld Mclean, MD  Cardiologist:  Berniece Salines, DO  Electrophysiologist:  None   Referring MD: Darreld Mclean, MD    My heart has been dropping again  History of Present Illness:    Erik Weber is a 45 y.o. male with a hx of history of hypertension, chronic kidney disease stage II, mild sleep apnea, frequent PVCs is here today for follow-up visit.  I did see the patient on January 28, 2020 at that time he was experiencing palpitation on the Cardizem 120 mg daily so we increase that to 180 mg daily.  He also had had some chest pain therefore I recommended patient to go a coronary CTA.  On his first attempt a coronary CTA the patient was given some beta-blocker withdrawal his heart rate during that time the sting was canceled.  He was able to get his CTA done we did not show any evidence of coronary artery disease.  He does experience some palpitations but for the most part he feels that this has improved.  He is bradycardic intermittently during the day but has not had any dizziness or lightheadedness.     Past Medical History:  Diagnosis Date  . Chronic renal insufficiency   . CKD (chronic kidney disease) stage 3, GFR 30-59 ml/min (HCC) 11/25/2018   Managed by [facility] Dr Carolin Sicks.  Secondary to hypertensive nephropathy   . Disorder resulting from impaired renal function 05/01/2007   Thought due to hypertension, followed by Dr. Florene Glen    . Essential hypertension 09/21/2009   Qualifier: Diagnosis of  By: Wynona Luna   . Fracture of base of fifth metacarpal bone of right hand   . GERD 04/04/2007   Qualifier: Diagnosis of  By: Wynona Luna   . GERD (gastroesophageal reflux disease)    hx of   . Hypertension   . Hypertriglyceridemia 05/28/2012  . Hypogonadism male 06/07/2011  . Iron deficiency anemia secondary to inadequate dietary iron intake  12/04/2019  . Morbid obesity ([place])   . Obesity 08/18/2013  . OSA (obstructive sleep apnea) 10/28/2018  . Pneumonitis 10/04/2018   after accidental inhalation of swimming pool water during swimmng lesson  . 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 Sleep apnea    Mild, no cpap needed  . Tinea versicolor 05/19/2013    Past Surgical History:  Procedure Laterality Date  . ADENOIDECTOMY     childhood  . 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 HAND SURGERY Right 02/2016  . UPPER GI ENDOSCOPY  03/10/2016  . XI ROBOTIC ASSISTED INGUINAL HERNIA REPAIR WITH MESH Left 10/10/2019   Procedure: XI ROBOTIC ASSISTED LEFT INGUINAL HERNIA REPAIR WITH MESH;  Surgeon: Greer Pickerel, MD;  Location: [facility];  Service: General;  Laterality: Left;    Current Medications: Current Meds  Medication Sig  . diltiazem (CARDIZEM CD) 120 MG 24 hr capsule Take 1 capsule (120 mg total) by mouth daily.  . Multiple Vitamins-Minerals (MULTIVITAMIN WITH MINERALS) tablet Take 1 tablet by mouth daily.  . [DISCONTINUED] diltiazem (CARDIZEM CD) 180 MG 24 hr capsule Take 1 capsule (180 mg total) by mouth daily.  . [DISCONTINUED]  SUMAtriptan (IMITREX) 50 MG tablet Take 50 mg by mouth daily.     Allergies:   Patient has no known allergies.   Social History   Socioeconomic History  . Marital status: Married    Spouse name: Not on file  . Number of children: 1  . Years of education: 2  . Highest education level: Not on file  Occupational History  . Occupation: Heritage manager  Tobacco Use  . Smoking status: Never Smoker  . Smokeless tobacco: Never Used  Vaping Use  . Vaping Use: Never used  Substance and Sexual Activity  . Alcohol use: No    Alcohol/week: 0.0 standard drinks  . Drug use: No  . Sexual activity: Yes     Partners: Male  Other Topics Concern  . Not on file  Social History Narrative   Occupation: Works for lab   Single    Adopted daughter - 4   Moved from [place] 4 yrs ago.   Never Smoked   Alcohol use-no   Caffeine use/day:  None   Does Patient Exercise:  yes            Social Determinants of Health   Financial Resource Strain: Not on file  Food Insecurity: Not on file  Transportation Needs: Not on file  Physical Activity: Not on file  Stress: Not on file  Social Connections: Not on file     Family History: The patient's family history includes Diabetes in his father; Hypertension in his father; Kidney cancer (age of onset: 34) in his mother; Lung cancer in his paternal uncle. There is no history of Colon cancer, Prostate cancer, Liver disease, Esophageal cancer, Stomach cancer, or Pancreatic cancer.  ROS:   Review of Systems  Constitution: Negative for decreased appetite, fever and weight gain.  HENT: Negative for congestion, ear discharge, hoarse voice and sore throat.   Eyes: Negative for discharge, redness, vision loss in right eye and visual halos.  Cardiovascular: Negative for chest pain, dyspnea on exertion, leg swelling, orthopnea and palpitations.  Respiratory: Negative for cough, hemoptysis, shortness of breath and snoring.   Endocrine: Negative for heat intolerance and polyphagia.  Hematologic/Lymphatic: Negative for bleeding problem. Does not bruise/bleed easily.  Skin: Negative for flushing, nail changes, rash and suspicious lesions.  Musculoskeletal: Negative for arthritis, joint pain, muscle cramps, myalgias, neck pain and stiffness.  Gastrointestinal: Negative for abdominal pain, bowel incontinence, diarrhea and excessive appetite.  Genitourinary: Negative for decreased libido, genital sores and incomplete emptying.  Neurological: Negative for brief paralysis, focal weakness, headaches and loss of balance.  Psychiatric/Behavioral: Negative for altered mental  status, depression and suicidal ideas.  Allergic/Immunologic: Negative for HIV exposure and persistent infections.    EKGs/Labs/Other Studies Reviewed:    The following studies were reviewed today:   EKG:  The ekg ordered today demonstrates sinus bradycardia, first-degree AV block compared to prior EKG no significant change.   Coronary CTA April 08, 2020 Aorta: Normal size.  No calcifications.  No dissection.  Aortic Valve:  Trileaflet.  No calcifications.  Coronary Arteries:  Normal coronary origin.  Right dominance.  RCA is a large dominant artery that gives rise to PDA and PLVB. There is no plaque.  Left main is a large artery that gives rise to LAD and LCX arteries.  LAD is a large vessel that has no plaque.  LCX is a non-dominant artery that gives rise to one large OM1 branch. There is no plaque.  Other findings:  Normal pulmonary vein  drainage into the left atrium.  Normal left atrial appendage without a thrombus.  Normal size of the pulmonary artery.  IMPRESSION: 1. Coronary calcium score of 0. This was 0 percentile for age and sex matched control.  2. Normal coronary origin with right dominance.  3. No evidence of CAD.  Berniece Salines, DO  Zio monitor The patient wore the monitor for 5 days 15 hours starting 12/08/2019. Indication: Palpitations  The minimum heart rate was 45 bpm, maximum heart rate was 175 bpm, and average heart rate was 79 bpm. Predominant underlying rhythm was Sinus Rhythm. First Degree AV Block was present.  Premature atrial complexes were rare less than 1%. Premature Ventricular complexes frequent (12.6%,80121). Ventricular Bigeminy and Trigeminy were present.  No pauses, No second or third degree AV block and no atrial fibrillation present. 14 patient triggered events 12 associated with premature ventricular complex.  Conclusion: This study is remarkable for symptomatic frequent premature ventricular  complex  Transthoracic echocardiogram IMPRESSIONS December 26, 2019 1. Frequent PVC's. Left ventricular ejection fraction, by estimation, is  55 to 60%. The left ventricle has normal function. The left ventricle has  no regional wall motion abnormalities. There is mild concentric left  ventricular hypertrophy. Left  ventricular diastolic parameters were normal.  2. Right ventricular systolic function is normal. The right ventricular  size is normal. There is normal pulmonary artery systolic pressure.  3. The mitral valve is normal in structure. No evidence of mitral valve  regurgitation. No evidence of mitral stenosis.  4. The aortic valve is tricuspid. Aortic valve regurgitation is not  visualized. No aortic stenosis is present.  5. The inferior vena cava is normal in size with greater than 50%  respiratory variability, suggesting right atrial pressure of 3 mmHg.   FINDINGS  Left Ventricle: Frequent PVC's. Left ventricular ejection fraction, by  estimation, is 55 to 60%. The left ventricle has normal function. The left  ventricle has no regional wall motion abnormalities. The left ventricular  internal cavity size was normal  in size. There is mild concentric left ventricular hypertrophy. Left  ventricular diastolic parameters were normal. Normal left ventricular  filling pressure.   Right Ventricle: The right ventricular size is normal. No increase in  right ventricular wall thickness. Right ventricular systolic function is  normal. There is normal pulmonary artery systolic pressure. The tricuspid  regurgitant velocity is 2.21 m/s, and  with an assumed right atrial pressure of 3 mmHg, the estimated right  ventricular systolic pressure is 32.9 mmHg.   Left Atrium: Left atrial size was normal in size.   Right Atrium: Right atrial size was normal in size.   Pericardium: There is no evidence of pericardial effusion.   Mitral Valve: The mitral valve is normal in structure.  No evidence of  mitral valve regurgitation. No evidence of mitral valve stenosis.   Tricuspid Valve: The tricuspid valve is normal in structure. Tricuspid  valve regurgitation is mild . No evidence of tricuspid stenosis.   Aortic Valve: The aortic valve is tricuspid. Aortic valve regurgitation is  not visualized. No aortic stenosis is present.   Pulmonic Valve: The pulmonic valve was normal in structure. Pulmonic valve  regurgitation is not visualized. No evidence of pulmonic stenosis.   Aorta: The aortic root, ascending aorta, aortic arch and descending aorta  are all structurally normal, with no evidence of dilitation or  obstruction.   Venous: A normal flow pattern is recorded from the right upper pulmonary  vein. The inferior vena cava  is normal in size with greater than 50%  respiratory variability, suggesting right atrial pressure of 3 mmHg.   IAS/Shunts: No atrial level shunt detected by color flow Doppler.       Recent Labs: 10/16/2019: TSH 1.000 12/03/2019: ALT 27; Hemoglobin 14.1; Platelet Count 285 04/06/2020: BUN 12; Creatinine, Ser 1.42; Magnesium 2.4; Potassium 4.2; Sodium 141  Recent Lipid Panel    Component Value Date/Time   CHOL 155 11/14/2018 1559   TRIG 123.0 11/14/2018 1559   HDL 28.50 (L) 11/14/2018 1559   CHOLHDL 5 11/14/2018 1559   VLDL 24.6 11/14/2018 1559   LDLCALC 101 (H) 11/14/2018 1559   LDLDIRECT 95.0 08/08/2018 0822    Physical Exam:    VS:  BP 122/80   Pulse (!) 57   Ht 6' (1.829 m)   Wt 205 lb 6.4 oz (93.2 kg)   SpO2 98%   BMI 27.86 kg/m     Wt Readings from Last 3 Encounters:  04/30/20 205 lb 6.4 oz (93.2 kg)  01/28/20 201 lb (91.2 kg)  12/08/19 200 lb (90.7 kg)     GEN: Well nourished, well developed in no acute distress HEENT: Normal NECK: No JVD; No carotid bruits LYMPHATICS: No lymphadenopathy CARDIAC: S1S2 noted,RRR, no murmurs, rubs, gallops RESPIRATORY:  Clear to auscultation without rales, wheezing or rhonchi   ABDOMEN: Soft, non-tender, non-distended, +bowel sounds, no guarding. EXTREMITIES: No edema, No cyanosis, no clubbing MUSCULOSKELETAL:  No deformity  SKIN: Warm and dry NEUROLOGIC:  Alert and oriented x 3, non-focal PSYCHIATRIC:  Normal affect, good insight  ASSESSMENT:    1. Frequent PVCs   2. Sinus bradycardia    PLAN:      His monitor did show frequent premature ventricular complex, Cardizem seems to be helping to improve the symptoms but the patient also is bradycardic.  What I like to do is reassess and see his PVC burden so I will put a 3-day monitor on the patient.   He is interested in talking to [facility] I am going to refer the patient today.   We discussed different possibilities of giving his frequent PVCs so put him on antiarrhythmics and eventually if that does not work possible PVC ablation.  For right now I am going to defer to my EP partners as the patient will be seeing them soon for need of transitioning to antiarrhythmics.  The patient is in agreement with the above plan. The patient left the office in stable condition.  The patient will follow up in 6 months or sooner if needed.   Medication Adjustments/Labs and Tests Ordered: Current medicines are reviewed at length with the patient today.  Concerns regarding medicines are outlined above.  Orders Placed This Encounter  Procedures  . Ambulatory referral to Cardiac Electrophysiology  . LONG TERM MONITOR (3-14 DAYS)  . EKG 12-Lead   Meds ordered this encounter  Medications  . diltiazem (CARDIZEM CD) 120 MG 24 hr capsule    Sig: Take 1 capsule (120 mg total) by mouth daily.    Dispense:  90 capsule    Refill:  3    Patient Instructions  Medication Instructions:  Your physician has recommended you make the following change in your medication:  DECREASE:  Cardizem 120 mg once daily *If you need a refill on your cardiac medications before your next appointment, please call your pharmacy*   Lab  Work: None If you have labs (blood work) drawn today and your tests are completely normal, you will receive your results  only by: Marland Kitchen MyChart Message (if you have MyChart) OR . A paper copy in the mail If you have any lab test that is abnormal or we need to change your treatment, we will call you to review the results.   Testing/Procedures: A zio monitor was ordered today. It will remain on for 3 days. You will then return monitor and event diary in provided box. It takes 1-2 weeks for report to be downloaded and returned to [place]. We will call you with the results. If monitor falls off or has orange flashing light, please call Zio for further instructions.      Follow-Up: At [facility], you and your health needs are our priority.  As part of our continuing mission to provide you with exceptional heart care, we have created designated Provider Care Teams.  These Care Teams include your primary Cardiologist (physician) and Advanced Practice Providers (APPs -  Physician Assistants and Nurse Practitioners) who all work together to provide you with the care you need, when you need it.  We recommend signing up for the patient portal called "MyChart".  Sign up information is provided on this After Visit Summary.  MyChart is used to connect with patients for Virtual Visits (Telemedicine).  Patients are able to view lab/test results, encounter notes, upcoming appointments, etc.  Non-urgent messages can be sent to your provider as well.   To learn more about what you can do with MyChart, go to [email or web address].    Your next appointment:   6 month(s)  The format for your next appointment:   In Person  Provider:   Berniece Salines, DO   Other Instructions      Adopting a Healthy Lifestyle.  Know what a healthy weight is for you (roughly BMI <25) and aim to maintain this   Aim for 7+ servings of fruits and vegetables daily   65-80+ fluid ounces of water or unsweet tea for healthy  kidneys   Limit to max 1 drink of alcohol per day; avoid smoking/tobacco   Limit animal fats in diet for cholesterol and heart health - choose grass fed whenever available   Avoid highly processed foods, and foods high in saturated/trans fats   Aim for low stress - take time to unwind and care for your mental health   Aim for 150 min of moderate intensity exercise weekly for heart health, and weights twice weekly for bone health   Aim for 7-9 hours of sleep daily   When it comes to diets, agreement about the perfect plan isnt easy to find, even among the experts. Experts at the [employer] developed an idea known as the Healthy Eating Plate. Just imagine a plate divided into logical, healthy portions.   The emphasis is on diet quality:   Load up on vegetables and fruits - one-half of your plate: Aim for color and variety, and remember that potatoes dont count.   Go for whole grains - one-quarter of your plate: Whole wheat, barley, wheat berries, quinoa, oats, brown rice, and foods made with them. If you want pasta, go with whole wheat pasta.   Protein power - one-quarter of your plate: Fish, chicken, beans, and nuts are all healthy, versatile protein sources. Limit red meat.   The diet, however, does go beyond the plate, offering a few other suggestions.   Use healthy plant oils, such as olive, canola, soy, corn, sunflower and peanut. Check the labels, and avoid partially hydrogenated oil, which have unhealthy  trans fats.   If youre thirsty, drink water. Coffee and tea are good in moderation, but skip sugary drinks and limit milk and dairy products to one or two daily servings.   The type of carbohydrate in the diet is more important than the amount. Some sources of carbohydrates, such as vegetables, fruits, whole grains, and beans-are healthier than others.   Finally, stay active  Signed, Berniece Salines, DO  04/30/2020 12:26 PM    [place]

## 2020-05-08 NOTE — Progress Notes (Signed)
[facility] [place], [place], [place] [address] [phone]  Date:  05/12/2020   Name:  Erik Weber   DOB:  February 16, 1975   MRN:  [id number]  PCP:  Darreld Mclean, MD    Chief Complaint: Follow-up (6 months . )   History of Present Illness:  Erik Weber is a 45 y.o. very pleasant male patient who presents with the following:  Patient here today for periodic follow-up visit Last seen by myself in October History of chronic kidney disease, sleep apnea, hypertension, gastric bypass in September 2020, SS trait  At our last visit he was planning to marry his partner Harrell Gave in November- this went well, offered my congrats  3 doses COVID-19 complete Cologuard completed last year He has completed some lab work recently, basic metabolic done in February He is seeing nephrology at [facility] -follows up regularly He was seen by cardiology, Dr. Harriet Masson last month regarding palpitations and bradycardia  1. Frequent PVCs   2. Sinus bradycardia    PLAN:     His monitor did show frequent premature ventricular complex, Cardizem seems to be helping to improve the symptoms but the patient also is bradycardic.  What I like to do is reassess and see his PVC burden so I will put a 3-day monitor on the patient.  He is interested in talking to [facility] I am going to refer the patient today.   We discussed different possibilities of giving his frequent PVCs so put him on antiarrhythmics and eventually if that does not work possible PVC ablation.  For right now I am going to defer to my EP partners as the patient will be seeing them soon for need of transitioning to antiarrhythmics.  He is seeing EP next week He is still bothered by PVC - he has bothersome palpitations, would be interested in possible ablation for treatment if appropriate  Wt Readings from Last 3 Encounters:  05/12/20 208 lb 6.4 oz (94.5 kg)  04/30/20  205 lb 6.4 oz (93.2 kg)  01/28/20 201 lb (91.2 kg)   He is otherwise generally doing well.  He is exercising on a regular basis- gym, riding a bike, he plans to start using in-line skates as well Reminded to wear helmet when riding outdoors  He is feeling good, remains very pleased with results from gastric bypass surgery   Patient Active Problem List   Diagnosis Date Noted  . Chronic renal insufficiency   . Fracture of base of fifth metacarpal bone of right hand   . GERD (gastroesophageal reflux disease)   . Hypertension   . Morbid obesity ([place])   . Positive TB test   . Sleep apnea   . Iron deficiency anemia secondary to inadequate dietary iron intake 12/04/2019  . CKD (chronic kidney disease) stage 3, GFR 30-59 ml/min ([place]) 11/25/2018  . OSA (obstructive sleep apnea) 10/28/2018  . 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 Pneumonitis 10/04/2018  . Obesity 08/18/2013  . Tinea versicolor 05/19/2013  . Hypertriglyceridemia 05/28/2012  . Hypogonadism male 06/07/2011  . Essential hypertension 09/21/2009  . 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 GERD 04/04/2007    Past Medical History:  Diagnosis Date  . Chronic renal insufficiency   . CKD (chronic kidney disease) stage 3, GFR 30-59 ml/min (HCC) 11/25/2018   Managed by [facility] Carolin Sicks.  Secondary to hypertensive nephropathy   . Disorder resulting from  impaired renal function 05/01/2007   Thought due to hypertension, followed by Dr. Florene Glen    . Essential hypertension 09/21/2009   Qualifier: Diagnosis of  By: Wynona Luna   . Fracture of base of fifth metacarpal bone of right hand   . GERD 04/04/2007   Qualifier: Diagnosis of  By: Wynona Luna   . GERD (gastroesophageal reflux disease)    hx of   . Hypertension   . Hypertriglyceridemia 05/28/2012  . Hypogonadism male 06/07/2011  . Iron deficiency anemia secondary to inadequate dietary iron intake 12/04/2019  . Morbid obesity ([place])   . Obesity  08/18/2013  . OSA (obstructive sleep apnea) 10/28/2018  . Pneumonitis 10/04/2018   after accidental inhalation of swimming pool water during swimmng lesson  . 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 Sleep apnea    Mild, no cpap needed  . Tinea versicolor 05/19/2013    Past Surgical History:  Procedure Laterality Date  . ADENOIDECTOMY     childhood  . 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 HAND SURGERY Right 02/2016  . UPPER GI ENDOSCOPY  03/10/2016  . XI ROBOTIC ASSISTED INGUINAL HERNIA REPAIR WITH MESH Left 10/10/2019   Procedure: XI ROBOTIC ASSISTED LEFT INGUINAL HERNIA REPAIR WITH MESH;  Surgeon: Greer Pickerel, MD;  Location: [facility];  Service: General;  Laterality: Left;    Social History   Tobacco Use  . Smoking status: Never Smoker  . Smokeless tobacco: Never Used  Vaping Use  . Vaping Use: Never used  Substance Use Topics  . Alcohol use: No    Alcohol/week: 0.0 standard drinks  . Drug use: No    Family History  Problem Relation Age of Onset  . Kidney cancer Mother 4  . Hypertension Father        age 69  . Diabetes Father   . Lung cancer Paternal Uncle   . Colon cancer Neg Hx   . Prostate cancer Neg Hx   . Liver disease Neg Hx   . Esophageal cancer Neg Hx   . Stomach cancer Neg Hx   . Pancreatic cancer Neg Hx     No Known Allergies  Medication list has been reviewed and updated.  Current Outpatient Medications on File Prior to Visit  Medication Sig Dispense Refill  . diltiazem (CARDIZEM CD) 180 MG 24 hr capsule Take 1 capsule (180 mg total) by mouth daily. 90 capsule 3  . Multiple Vitamins-Minerals (MULTIVITAMIN WITH MINERALS) tablet Take 1 tablet by mouth daily.     No current facility-administered medications on file prior to visit.    Review of Systems:  As per HPI-  otherwise negative.   Physical Examination: Vitals:   05/12/20 0903  BP: 122/72  Pulse: (!) 59  Resp: 20  Temp: 98 F (36.7 C)  SpO2: 99%   Vitals:   05/12/20 0903  Weight: 208 lb 6.4 oz (94.5 kg)  Height: 6' (1.829 m)   Body mass index is 28.26 kg/m. Ideal Body Weight: Weight in (lb) to have BMI = 25: 183.9  GEN: no acute distress.  Normal weight, looks well HEENT: Atraumatic, Normocephalic.   Bilateral TM wnl, oropharynx normal.  PEERL,EOMI.   Ears and Nose: No external deformity. CV: RRR, No M/G/R. No JVD. No thrill. No  extra heart sounds. PULM: CTA B, no wheezes, crackles, rhonchi. No retractions. No resp. distress. No accessory muscle use. ABD: S, NT, ND, +BS. No rebound. No HSM. EXTR: No c/c/e PSYCH: Normally interactive. Conversant.    Assessment and Plan: Iron deficiency - Plan: Ferritin  S/P gastric bypass  Essential hypertension  Stage 3 chronic kidney disease, unspecified whether stage 3a or 3b CKD (HCC)  Sickle cell trait ([place])  Here today for periodic follow-up visit.  Blood pressure looks good with current dose of diltiazem.  We have been seeing patient every 6 months following up from gastric bypass.  He has continued to do well, I advised him we can now see him every 12 months if he likes.  I do want him to have kidney function every 6 months or so, but he can alternate between seeing [place] and his nephrologist  He was noted to have iron deficiency in the past, check ferritin today He is aware of sickle cell trait, does not plan to father any further biological children   Received ferritin level, message to patient Results for orders placed or performed in visit on 05/12/20  Ferritin  Result Value Ref Range   Ferritin 55.1 22.0 - 322.0 ng/mL    This visit occurred during the SARS-CoV-2 public health emergency.  Safety protocols were in place, including screening questions prior to the visit, additional usage of staff PPE, and extensive cleaning of  exam room while observing appropriate contact time as indicated for disinfecting solutions.    Signed Lamar Blinks, MD

## 2020-05-08 NOTE — Patient Instructions (Addendum)
It was great to see you again today Assuming all is well, let us visit in 12 months- I do want someone to check your renal function about every 6 months I will be in touch with your ferritin level

## 2020-05-12 ENCOUNTER — Ambulatory Visit (INDEPENDENT_AMBULATORY_CARE_PROVIDER_SITE_OTHER): Payer: BC Managed Care – PPO | Admitting: Family Medicine

## 2020-05-12 ENCOUNTER — Encounter: Payer: Self-pay | Admitting: Family Medicine

## 2020-05-12 ENCOUNTER — Other Ambulatory Visit: Payer: Self-pay

## 2020-05-12 VITALS — BP 122/72 | HR 59 | Temp 98.0°F | Resp 20 | Ht 72.0 in | Wt 208.4 lb

## 2020-05-12 DIAGNOSIS — E611 Iron deficiency: Secondary | ICD-10-CM

## 2020-05-12 DIAGNOSIS — I10 Essential (primary) hypertension: Secondary | ICD-10-CM

## 2020-05-12 DIAGNOSIS — D573 Sickle-cell trait: Secondary | ICD-10-CM | POA: Insufficient documentation

## 2020-05-12 DIAGNOSIS — Z9884 Bariatric surgery status: Secondary | ICD-10-CM | POA: Diagnosis not present

## 2020-05-12 DIAGNOSIS — N183 Chronic kidney disease, stage 3 unspecified: Secondary | ICD-10-CM

## 2020-05-12 LAB — FERRITIN: Ferritin: 55.1 ng/mL (ref 22.0–322.0)

## 2020-05-18 ENCOUNTER — Ambulatory Visit: Payer: BC Managed Care – PPO | Admitting: Cardiology

## 2020-05-18 ENCOUNTER — Encounter: Payer: Self-pay | Admitting: Cardiology

## 2020-05-18 ENCOUNTER — Other Ambulatory Visit: Payer: Self-pay

## 2020-05-18 VITALS — BP 122/70 | HR 74 | Ht 72.0 in | Wt 207.0 lb

## 2020-05-18 DIAGNOSIS — I493 Ventricular premature depolarization: Secondary | ICD-10-CM

## 2020-05-18 MED ORDER — FLECAINIDE ACETATE 50 MG PO TABS: 50.0000 mg | ORAL_TABLET | Freq: Two times a day (BID) | ORAL | 3 refills | Status: DC

## 2020-05-18 NOTE — Patient Instructions (Addendum)
Medication Instructions:  Your physician has recommended you make the following change in your medication:  1. START Flecainide 50 mg twice daily  *If you need a refill on your cardiac medications before your next appointment, please call your pharmacy*   Lab Work: None ordered   Testing/Procedures: None ordered   Follow-Up: At [facility], you and your health needs are our priority.  As part of our continuing mission to provide you with exceptional heart care, we have created designated Provider Care Teams.  These Care Teams include your primary Cardiologist (physician) and Advanced Practice Providers (APPs -  Physician Assistants and Nurse Practitioners) who all work together to provide you with the care you need, when you need it.  Your next appointment:   3 month(s)  In [place] office  The format for your next appointment:   In Person  Provider:   Allegra Lai, MD     Thank you for choosing [facility]!!   Trinidad Curet, RN [phone]     Premature Ventricular Contraction  A premature ventricular contraction (PVC) is a common kind of irregular heartbeat (arrhythmia). These contractions are extra heartbeats that start in the ventricles of the heart and occur too early in the normal sequence. During the PVC, the heart's normal electrical pathway is not used, so the beat is shorter and less effective. In most cases, these contractions come and go and do not require treatment. What are the causes? Common causes of the condition include:  Smoking.  Drinking alcohol.  Certain medicines.  Some illegal drugs.  Stress.  Caffeine. Certain medical conditions can also cause PVCs:  Heart failure.  Heart attack, or coronary artery disease.  Heart valve problems.  Changes in minerals in the blood (electrolytes).  Low blood oxygen levels or high carbon dioxide levels. In many cases, the cause of this condition is not known. What are the  signs or symptoms? The main symptom of this condition is fast or skipped heartbeats (palpitations). Other symptoms include:  Chest pain.  Shortness of breath.  Feeling tired.  Dizziness.  Difficulty exercising. In some cases, there are no symptoms. How is this diagnosed? This condition may be diagnosed based on:  Your medical history.  A physical exam. During the exam, the health care provider will check for irregular heartbeats.  Tests, such as: ? An ECG (electrocardiogram) to monitor the electrical activity of your heart. ? An ambulatory cardiac monitor. This device records your heartbeats for 24 hours or more. ? Stress tests to see how exercise affects your heart rhythm and blood supply. ? An echocardiogram. This test uses sound waves (ultrasound) to produce an image of your heart. ? An electrophysiology study (EPS). This test checks for electrical problems in your heart. How is this treated? Treatment for this condition depends on any underlying conditions, the type of PVCs that you are having, and how much the symptoms are interfering with your daily life. Possible treatments include:  Avoiding things that cause premature contractions (triggers). These include caffeine and alcohol.  Taking medicines if symptoms are severe or if the extra heartbeats are frequent.  Getting treatment for underlying conditions that cause PVCs.  Having an implantable cardioverter defibrillator (ICD), if you are at risk for a serious arrhythmia. The ICD is a small device that is inserted into your chest to monitor your heartbeat. When it senses an irregular heartbeat, it sends a shock to bring the heartbeat back to normal.  Having a procedure to destroy the portion  of the heart tissue that sends out abnormal signals (catheter ablation). In some cases, no treatment is required. Follow these instructions at home: Lifestyle  Do not use any products that contain nicotine or tobacco, such as  cigarettes, e-cigarettes, and chewing tobacco. If you need help quitting, ask your health care provider.  Do not use illegal drugs.  Exercise regularly. Ask your health care provider what type of exercise is safe for you.  Try to get at least 7-9 hours of sleep each night, or as much as recommended by your health care provider.  Find healthy ways to manage stress. Avoid stressful situations when possible. Alcohol use  Do not drink alcohol if: ? Your health care provider tells you not to drink. ? You are pregnant, may be pregnant, or are planning to become pregnant. ? Alcohol triggers your episodes.  If you drink alcohol: ? Limit how much you use to:  0-1 drink a day for women.  0-2 drinks a day for men.  Be aware of how much alcohol is in your drink. In the [place] one drink equals one 12 oz bottle of beer (355 mL), one 5 oz glass of wine (148 mL), or one 1 oz glass of hard liquor (44 mL). General instructions  Take over-the-counter and prescription medicines only as told by your health care provider.  If caffeine triggers episodes of PVC, do not eat, drink, or use anything with caffeine in it.  Keep all follow-up visits as told by your health care provider. This is important. Contact a health care provider if you:  Feel palpitations. Get help right away if you:  Have chest pain.  Have shortness of breath.  Have sweating for no reason.  Have nausea and vomiting.  Become light-headed or you faint. Summary  A premature ventricular contraction (PVC) is a common kind of irregular heartbeat (arrhythmia).  In most cases, these contractions come and go and do not require treatment.  You may need to wear an ambulatory cardiac monitor. This records your heartbeats for 24 hours or more.  Treatment depends on any underlying conditions, the type of PVCs that you are having, and how much the symptoms are interfering with your daily life. This information is not intended to  replace advice given to you by your health care provider. Make sure you discuss any questions you have with your health care provider. Document Revised: 10/18/2017 Document Reviewed: 10/18/2017 Elsevier Patient Education  2021 [place].   Flecainide tablets What is this medicine? FLECAINIDE (FLEK a nide) is an antiarrhythmic drug. This medicine is used to prevent irregular heart rhythm. It can also slow down fast heartbeats called tachycardia. This medicine may be used for other purposes; ask your health care provider or pharmacist if you have questions. COMMON BRAND NAME(S): Tambocor What should I tell my health care provider before I take this medicine? They need to know if you have any of these conditions:  abnormal levels of potassium in the blood  heart disease including heart rhythm and heart rate problems  kidney or liver disease  recent heart attack  an unusual or allergic reaction to flecainide, local anesthetics, other medicines, foods, dyes, or preservatives  pregnant or trying to get pregnant  breast-feeding How should I use this medicine? Take this medicine by mouth with a glass of water. Follow the directions on the prescription label. You can take this medicine with or without food. Take your doses at regular intervals. Do not take your medicine more often  than directed. Do not stop taking this medicine suddenly. This may cause serious, heart-related side effects. If your doctor wants you to stop the medicine, the dose may be slowly lowered over time to avoid any side effects. Talk to your pediatrician regarding the use of this medicine in children. While this drug may be prescribed for children as young as 1 year of age for selected conditions, precautions do apply. Overdosage: If you think you have taken too much of this medicine contact a poison control center or emergency room at once. NOTE: This medicine is only for you. Do not share this medicine with  others. What if I miss a dose? If you miss a dose, take it as soon as you can. If it is almost time for your next dose, take only that dose. Do not take double or extra doses. What may interact with this medicine? Do not take this medicine with any of the following medications:  amoxapine  arsenic trioxide  certain antibiotics like clarithromycin, erythromycin, gatifloxacin, gemifloxacin, levofloxacin, moxifloxacin, sparfloxacin, or troleandomycin  certain antidepressants called tricyclic antidepressants like amitriptyline, imipramine, or nortriptyline  certain medicines to control heart rhythm like disopyramide, encainide, moricizine, procainamide, propafenone, and quinidine  cisapride  delavirdine  droperidol  haloperidol  [place]  imatinib  levomethadyl  maprotiline  medicines for malaria like chloroquine and halofantrine  pentamidine  phenothiazines like chlorpromazine, mesoridazine, prochlorperazine, thioridazine  pimozide  quinine  ranolazine  ritonavir  sertindole This medicine may also interact with the following medications:  cimetidine  dofetilide  medicines for angina or high blood pressure  medicines to control heart rhythm like amiodarone and digoxin  ziprasidone This list may not describe all possible interactions. Give your health care provider a list of all the medicines, herbs, non-prescription drugs, or dietary supplements you use. Also tell them if you smoke, drink alcohol, or use illegal drugs. Some items may interact with your medicine. What should I watch for while using this medicine? Visit your doctor or health care professional for regular checks on your progress. Because your condition and the use of this medicine carries some risk, it is a good idea to carry an identification card, necklace or bracelet with details of your condition, medications and doctor or health care professional. Check your blood pressure and pulse rate  regularly. Ask your health care professional what your blood pressure and pulse rate should be, and when you should contact him or her. Your doctor or health care professional also may schedule regular blood tests and electrocardiograms to check your progress. You may get drowsy or dizzy. Do not drive, use machinery, or do anything that needs mental alertness until you know how this medicine affects you. Do not stand or sit up quickly, especially if you are an older patient. This reduces the risk of dizzy or fainting spells. Alcohol can make you more dizzy, increase flushing and rapid heartbeats. Avoid alcoholic drinks. What side effects may I notice from receiving this medicine? Side effects that you should report to your doctor or health care professional as soon as possible:  chest pain, continued irregular heartbeats  difficulty breathing  swelling of the legs or feet  trembling, shaking  unusually weak or tired Side effects that usually do not require medical attention (report to your doctor or health care professional if they continue or are bothersome):  blurred vision  constipation  headache  nausea, vomiting  stomach pain This list may not describe all possible side effects. Call your doctor for  medical advice about side effects. You may report side effects to FDA at 1-800-FDA-1088. Where should I keep my medicine? Keep out of the reach of children. Store at room temperature between 15 and 30 degrees C (59 and 86 degrees F). Protect from light. Keep container tightly closed. Throw away any unused medicine after the expiration date. NOTE: This sheet is a summary. It may not cover all possible information. If you have questions about this medicine, talk to your doctor, pharmacist, or health care provider.  2021 Elsevier/Gold Standard (2018-01-14 11:41:38)

## 2020-05-18 NOTE — Progress Notes (Signed)
Electrophysiology Office Note   Date:  05/18/2020   ID:  Erik Weber, DOB 05-10-1975, MRN [id number]  PCP:  Darreld Mclean, MD  Cardiologist:  Tobb Primary Electrophysiologist:  Kristian Mogg Meredith Leeds, MD    Chief Complaint: PVC   History of Present Illness: Erik Weber is a 45 y.o. male who is being seen today for the evaluation of PVC at the request of Tobb, Kardie, DO. Presenting today for electrophysiology evaluation.  He has a history significant for hypertension, CKD stage II, sleep apnea, PVCs.  He is currently on Cardizem.  Palpitations have improved on Cardizem.  He wore an initial monitor that showed a 12% PVC burden.  His most recent monitor shows that his PVC burden is down to 7%.  Today, he denies symptoms of palpitations, chest pain, shortness of breath, orthopnea, PND, lower extremity edema, claudication, dizziness, presyncope, syncope, bleeding, or neurologic sequela. The patient is tolerating medications without difficulties.  His symptoms are intermittent.  He has no chest pain, but he does have intermittent shortness of breath and fatigue.  He feels that these are related to his PVCs.  He did bring in a tracing from his smart watch at a time that he was having palpitations, but this simply shows sinus rhythm.   Past Medical History:  Diagnosis Date  . Chronic renal insufficiency   . CKD (chronic kidney disease) stage 3, GFR 30-59 ml/min (HCC) 11/25/2018   Managed by [facility] Dr Carolin Sicks.  Secondary to hypertensive nephropathy   . Disorder resulting from impaired renal function 05/01/2007   Thought due to hypertension, followed by Dr. Florene Glen    . Essential hypertension 09/21/2009   Qualifier: Diagnosis of  By: Wynona Luna   . Fracture of base of fifth metacarpal bone of right hand   . GERD 04/04/2007   Qualifier: Diagnosis of  By: Wynona Luna   . GERD (gastroesophageal reflux disease)    hx of   . Hypertension   . Hypertriglyceridemia  05/28/2012  . Hypogonadism male 06/07/2011  . Iron deficiency anemia secondary to inadequate dietary iron intake 12/04/2019  . Morbid obesity ([place])   . Obesity 08/18/2013  . OSA (obstructive sleep apnea) 10/28/2018  . Pneumonitis 10/04/2018   after accidental inhalation of swimming pool water during swimmng lesson  . 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 Sleep apnea    Mild, no cpap needed  . Tinea versicolor 05/19/2013   Past Surgical History:  Procedure Laterality Date  . ADENOIDECTOMY     childhood  . 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 HAND SURGERY Right 02/2016  . UPPER GI ENDOSCOPY  03/10/2016  . XI ROBOTIC ASSISTED INGUINAL HERNIA REPAIR WITH MESH Left 10/10/2019   Procedure: XI ROBOTIC ASSISTED LEFT INGUINAL HERNIA REPAIR WITH MESH;  Surgeon: Greer Pickerel, MD;  Location: [facility];  Service: General;  Laterality: Left;     Current Outpatient Medications  Medication Sig Dispense Refill  . diltiazem (CARDIZEM CD) 180 MG 24 hr capsule Take 1 capsule (180 mg total) by mouth daily. 90 capsule 3  . flecainide (TAMBOCOR) 50 MG tablet Take 1 tablet (50 mg total) by mouth 2 (two) times daily. 60 tablet 3  . Multiple Vitamins-Minerals (MULTIVITAMIN WITH MINERALS) tablet Take 1 tablet  by mouth daily.     No current facility-administered medications for this visit.    Allergies:   Patient has no known allergies.   Social History:  The patient  reports that he has never smoked. He has never used smokeless tobacco. He reports that he does not drink alcohol and does not use drugs.   Family History:  The patient's family history includes Diabetes in his father; Hypertension in his father; Kidney cancer (age of onset: 60) in his mother; Lung cancer in his paternal uncle.    ROS:  Please see the history of  present illness.   Otherwise, review of systems is positive for none.   All other systems are reviewed and negative.    PHYSICAL EXAM: VS:  BP 122/70   Pulse 74   Ht 6' (1.829 m)   Wt 207 lb (93.9 kg)   SpO2 99%   BMI 28.07 kg/m  , BMI Body mass index is 28.07 kg/m. GEN: Well nourished, well developed, in no acute distress  HEENT: normal  Neck: no JVD, carotid bruits, or masses Cardiac: RRR; no murmurs, rubs, or gallops,no edema  Respiratory:  clear to auscultation bilaterally, normal work of breathing GI: soft, nontender, nondistended, + BS MS: no deformity or atrophy  Skin: warm and dry Neuro:  Strength and sensation are intact Psych: euthymic mood, full affect  EKG:  EKG is ordered today. Personal review of the ekg ordered shows sinus rhythm, PVCs  Recent Labs: 10/16/2019: TSH 1.000 12/03/2019: ALT 27; Hemoglobin 14.1; Platelet Count 285 04/06/2020: BUN 12; Creatinine, Ser 1.42; Magnesium 2.4; Potassium 4.2; Sodium 141    Lipid Panel     Component Value Date/Time   CHOL 155 11/14/2018 1559   TRIG 123.0 11/14/2018 1559   HDL 28.50 (L) 11/14/2018 1559   CHOLHDL 5 11/14/2018 1559   VLDL 24.6 11/14/2018 1559   LDLCALC 101 (H) 11/14/2018 1559   LDLDIRECT 95.0 08/08/2018 0822     Wt Readings from Last 3 Encounters:  05/18/20 207 lb (93.9 kg)  05/12/20 208 lb 6.4 oz (94.5 kg)  04/30/20 205 lb 6.4 oz (93.2 kg)      Other studies Reviewed: Additional studies/ records that were reviewed today include: TTE 02/25/19  Review of the above records today demonstrates:  1. Frequent PVC's. Left ventricular ejection fraction, by estimation, is  55 to 60%. The left ventricle has normal function. The left ventricle has  no regional wall motion abnormalities. There is mild concentric left  ventricular hypertrophy. Left  ventricular diastolic parameters were normal.  2. Right ventricular systolic function is normal. The right ventricular  size is normal. There is normal  pulmonary artery systolic pressure.  3. The mitral valve is normal in structure. No evidence of mitral valve  regurgitation. No evidence of mitral stenosis.  4. The aortic valve is tricuspid. Aortic valve regurgitation is not  visualized. No aortic stenosis is present.  5. The inferior vena cava is normal in size with greater than 50%  respiratory variability, suggesting right atrial pressure of 3 mmHg.  Cardiac monitor 05/14/2020 personally reviewed Max 179 bpm 11:26am, 03/26 Min 29 bpm 09:13pm, 03/25 Avg 72 bpm Less than 1% supraventricular ectopy 7.1% ventricular ectopy  ASSESSMENT AND PLAN:  1.  PVCs: Currently on diltiazem.  Burden has come down from 12 to 7%.  Ejection fraction is fortunately remained normal.  He does continue to have symptoms with palpitations and shortness of breath.  Due to that, we Erik Weber start him on  flecainide 50 mg.  Due to his PVC burden of 7%, we Erik Weber hold off on ablation.    2.  Mobitz 1 second-degree AV block: Found on ECG from 03/18/2020.  He is currently on 180 mg of diltiazem.  We Erik Weber hold off on further titration.  Case discussed with primary cardiology  Current medicines are reviewed at length with the patient today.   The patient does not have concerns regarding his medicines.  The following changes were made today: Start flecainide  Labs/ tests ordered today include:  Orders Placed This Encounter  Procedures  . EKG 12-Lead     Disposition:   FU with Erik Weber 3 months  Signed, Erik Erik Meredith Leeds, MD  05/18/2020 9:18 AM     [facility] [address] [place] [address] [phone] (office) [phone] (fax)

## 2020-07-02 ENCOUNTER — Telehealth: Payer: Self-pay

## 2020-07-02 DIAGNOSIS — Z006 Encounter for examination for normal comparison and control in clinical research program: Secondary | ICD-10-CM

## 2020-07-02 NOTE — Telephone Encounter (Signed)
Called patient for 90 day Identify phone call no answer, I left a voicemail stating the intent of the phone call and our call back number to be reached in our department.

## 2020-07-13 ENCOUNTER — Telehealth: Payer: Self-pay | Admitting: *Deleted

## 2020-07-13 DIAGNOSIS — Z006 Encounter for examination for normal comparison and control in clinical research program: Secondary | ICD-10-CM

## 2020-07-13 NOTE — Telephone Encounter (Signed)
I called patient for 90-day Identify study phone call. Patient stated doing well with no cardiac symptoms.

## 2020-07-23 ENCOUNTER — Ambulatory Visit: Payer: BC Managed Care – PPO | Admitting: Cardiology

## 2020-07-23 ENCOUNTER — Other Ambulatory Visit: Payer: Self-pay

## 2020-07-23 ENCOUNTER — Encounter: Payer: Self-pay | Admitting: Cardiology

## 2020-07-23 VITALS — BP 110/80 | HR 71 | Ht 72.0 in | Wt 210.0 lb

## 2020-07-23 DIAGNOSIS — Z79899 Other long term (current) drug therapy: Secondary | ICD-10-CM | POA: Diagnosis not present

## 2020-07-23 DIAGNOSIS — I493 Ventricular premature depolarization: Secondary | ICD-10-CM

## 2020-07-23 MED ORDER — DILTIAZEM HCL ER COATED BEADS 120 MG PO CP24: 120.0000 mg | ORAL_CAPSULE | Freq: Every day | ORAL | 3 refills | Status: DC

## 2020-07-23 NOTE — Patient Instructions (Signed)
Medication Instructions:  START: Diltiazem 120 mg take one tablet by mouth daily.  *If you need a refill on your cardiac medications before your next appointment, please call your pharmacy*   Lab Work: Your physician recommends that you return for lab work in: TODAY Flecainide level If you have labs (blood work) drawn today and your tests are completely normal, you will receive your results only by: [place] (if you have MyChart) OR A paper copy in the mail If you have any lab test that is abnormal or we need to change your treatment, we will call you to review the results.   Testing/Procedures: None   Follow-Up: At [facility], you and your health needs are our priority.  As part of our continuing mission to provide you with exceptional heart care, we have created designated Provider Care Teams.  These Care Teams include your primary Cardiologist (physician) and Advanced Practice Providers (APPs -  Physician Assistants and Nurse Practitioners) who all work together to provide you with the care you need, when you need it.  We recommend signing up for the patient portal called "MyChart".  Sign up information is provided on this After Visit Summary.  MyChart is used to connect with patients for Virtual Visits (Telemedicine).  Patients are able to view lab/test results, encounter notes, upcoming appointments, etc.  Non-urgent messages can be sent to your provider as well.   To learn more about what you can do with MyChart, go to [email or web address].    Your next appointment:   1 year(s)  The format for your next appointment:   In Person  Provider:   Berniece Salines, DO   Other Instructions

## 2020-07-23 NOTE — Progress Notes (Signed)
Cardiology Office Note:    Date:  07/23/2020   ID:  Erik Weber, DOB 05/02/1975, MRN [id number]  PCP:  Darreld Mclean, MD  Cardiologist:  Berniece Salines, DO  Electrophysiologist:  None   Referring MD: Darreld Mclean, MD   No chief complaint on file. I feel much better  History of Present Illness:    Erik Weber is a 45 y.o. male with a hx of history of hypertension, chronic kidney disease stage II, mild sleep apnea, frequent PVCs is here today for follow-up visit.   I did see the patient on January 28, 2020 at that time he was experiencing palpitation on the Cardizem 120 mg daily so we increase that to 180 mg daily.  He also had had some chest pain therefore I recommended patient to go a coronary CTA.  I saw the patient on April 27, 2020 at that time we discussed prior episodes of significant low heart rates on the beta-blocker for his coronary CTA.  Thankfully he was able toSuccessfully get his CT and we did not show any evidence of coronary artery disease.  At that visit we talked about his frequent PVCs and the fact that his heart rate was still low on his AV nodal blocker.  We discussed options for antiarrhythmics versus ablation.  He did see EP and appeared to be doing well since he has been started on flecainide.  Past Medical History:  Diagnosis Date   Chronic renal insufficiency    CKD (chronic kidney disease) stage 3, GFR 30-59 ml/min (HCC) 11/25/2018   Managed by [facility] Dr Carolin Sicks.  Secondary to hypertensive nephropathy    Disorder resulting from impaired renal function 05/01/2007   Thought due to hypertension, followed by Dr. Florene Glen     Essential hypertension 09/21/2009   Qualifier: Diagnosis of  By: Wynona Luna    Fracture of base of fifth metacarpal bone of right hand    GERD 04/04/2007   Qualifier: Diagnosis of  By: Wynona Luna    GERD (gastroesophageal reflux disease)    hx of    Hypertension    Hypertriglyceridemia 05/28/2012    Hypogonadism male 06/07/2011   Iron deficiency anemia secondary to inadequate dietary iron intake 12/04/2019   Morbid obesity ([place])    Obesity 08/18/2013   OSA (obstructive sleep apnea) 10/28/2018   Pneumonitis 10/04/2018   after accidental inhalation of swimming pool water during swimmng lesson   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   Sleep apnea    Mild, no cpap needed   Tinea versicolor 05/19/2013    Past Surgical History:  Procedure Laterality Date   ADENOIDECTOMY     childhood   GASTRIC ROUX-EN-Y N/A 10/28/2018   Procedure: LAPAROSCOPIC ROUX-EN-Y GASTRIC BYPASS WITH UPPER ENDOSCOPY;  Surgeon: Greer Pickerel, MD;  Location: [facility];  Service: General;  Laterality: N/A;   HAND SURGERY Right 02/2016   UPPER GI ENDOSCOPY  03/10/2016   XI ROBOTIC ASSISTED INGUINAL HERNIA REPAIR WITH MESH Left 10/10/2019   Procedure: XI ROBOTIC ASSISTED LEFT INGUINAL HERNIA REPAIR WITH MESH;  Surgeon: Greer Pickerel, MD;  Location: [facility];  Service: General;  Laterality: Left;    Current Medications: Current Meds  Medication Sig   diltiazem (CARDIZEM CD) 120 MG 24 hr capsule Take 1 capsule (120 mg total) by mouth daily.   flecainide (TAMBOCOR) 50 MG tablet Take 1 tablet (50 mg total) by mouth  2 (two) times daily.   Multiple Vitamins-Minerals (MULTIVITAMIN WITH MINERALS) tablet Take 1 tablet by mouth daily.   [DISCONTINUED] diltiazem (CARDIZEM CD) 180 MG 24 hr capsule Take 1 capsule (180 mg total) by mouth daily.     Allergies:   Patient has no known allergies.   Social History   Socioeconomic History   Marital status: Married    Spouse name: Not on file   Number of children: 1   Years of education: 16   Highest education level: Not on file  Occupational History   Occupation: Heritage manager  Tobacco Use   Smoking status: Never   Smokeless tobacco: Never  Vaping Use   Vaping Use: Never used  Substance and Sexual Activity   Alcohol use: No     Alcohol/week: 0.0 standard drinks   Drug use: No   Sexual activity: Yes    Partners: Male  Other Topics Concern   Not on file  Social History Narrative   Occupation: Works for lab   Single    Adopted daughter - 59   Moved from [place] 4 yrs ago.   Never Smoked   Alcohol use-no   Caffeine use/day:  None   Does Patient Exercise:  yes            Social Determinants of Health   Financial Resource Strain: Not on file  Food Insecurity: Not on file  Transportation Needs: Not on file  Physical Activity: Not on file  Stress: Not on file  Social Connections: Not on file     Family History: The patient's family history includes Diabetes in his father; Hypertension in his father; Kidney cancer (age of onset: 22) in his mother; Lung cancer in his paternal uncle. There is no history of Colon cancer, Prostate cancer, Liver disease, Esophageal cancer, Stomach cancer, or Pancreatic cancer.  ROS:   Review of Systems  Constitution: Negative for decreased appetite, fever and weight gain.  HENT: Negative for congestion, ear discharge, hoarse voice and sore throat.   Eyes: Negative for discharge, redness, vision loss in right eye and visual halos.  Cardiovascular: Negative for chest pain, dyspnea on exertion, leg swelling, orthopnea and palpitations.  Respiratory: Negative for cough, hemoptysis, shortness of breath and snoring.   Endocrine: Negative for heat intolerance and polyphagia.  Hematologic/Lymphatic: Negative for bleeding problem. Does not bruise/bleed easily.  Skin: Negative for flushing, nail changes, rash and suspicious lesions.  Musculoskeletal: Negative for arthritis, joint pain, muscle cramps, myalgias, neck pain and stiffness.  Gastrointestinal: Negative for abdominal pain, bowel incontinence, diarrhea and excessive appetite.  Genitourinary: Negative for decreased libido, genital sores and incomplete emptying.  Neurological: Negative for brief paralysis, focal weakness,  headaches and loss of balance.  Psychiatric/Behavioral: Negative for altered mental status, depression and suicidal ideas.  Allergic/Immunologic: Negative for HIV exposure and persistent infections.    EKGs/Labs/Other Studies Reviewed:    The following studies were reviewed today:   EKG: None today  Coronary CTA April 08, 2020 Aorta: Normal size.  No calcifications.  No dissection.   Aortic Valve:  Trileaflet.  No calcifications.   Coronary Arteries:  Normal coronary origin.  Right dominance.   RCA is a large dominant artery that gives rise to PDA and PLVB. There is no plaque.   Left main is a large artery that gives rise to LAD and LCX arteries.   LAD is a large vessel that has no plaque.   LCX is a non-dominant artery that gives rise to one  large OM1 branch. There is no plaque.   Other findings:   Normal pulmonary vein drainage into the left atrium.   Normal left atrial appendage without a thrombus.   Normal size of the pulmonary artery.   IMPRESSION: 1. Coronary calcium score of 0. This was 0 percentile for age and sex matched control.   2. Normal coronary origin with right dominance.   3. No evidence of CAD.   Berniece Salines, DO   Zio monitor The patient wore the monitor for 5 days 15 hours starting 12/08/2019. Indication: Palpitations   The minimum heart rate was 45 bpm, maximum heart rate was 175 bpm, and average heart rate was 79 bpm. Predominant underlying rhythm was Sinus Rhythm. First Degree AV Block was present.   Premature atrial complexes were rare less than 1%. Premature Ventricular complexes frequent (12.6%,80121). Ventricular Bigeminy and Trigeminy were present.   No pauses, No second or third degree AV block and no atrial fibrillation present. 14 patient triggered events 12 associated with premature ventricular complex.   Conclusion: This study is remarkable for symptomatic frequent premature ventricular complex   Transthoracic echocardiogram  IMPRESSIONS December 26, 2019  1. Frequent PVC's. Left ventricular ejection fraction, by estimation, is  55 to 60%. The left ventricle has normal function. The left ventricle has  no regional wall motion abnormalities. There is mild concentric left  ventricular hypertrophy. Left  ventricular diastolic parameters were normal.   2. Right ventricular systolic function is normal. The right ventricular  size is normal. There is normal pulmonary artery systolic pressure.   3. The mitral valve is normal in structure. No evidence of mitral valve  regurgitation. No evidence of mitral stenosis.   4. The aortic valve is tricuspid. Aortic valve regurgitation is not  visualized. No aortic stenosis is present.   5. The inferior vena cava is normal in size with greater than 50%  respiratory variability, suggesting right atrial pressure of 3 mmHg.   FINDINGS   Left Ventricle: Frequent PVC's. Left ventricular ejection fraction, by  estimation, is 55 to 60%. The left ventricle has normal function. The left  ventricle has no regional wall motion abnormalities. The left ventricular  internal cavity size was normal  in size. There is mild concentric left ventricular hypertrophy. Left  ventricular diastolic parameters were normal. Normal left ventricular  filling pressure.   Right Ventricle: The right ventricular size is normal. No increase in  right ventricular wall thickness. Right ventricular systolic function is  normal. There is normal pulmonary artery systolic pressure. The tricuspid  regurgitant velocity is 2.21 m/s, and   with an assumed right atrial pressure of 3 mmHg, the estimated right  ventricular systolic pressure is 87.6 mmHg.   Left Atrium: Left atrial size was normal in size.   Right Atrium: Right atrial size was normal in size.   Pericardium: There is no evidence of pericardial effusion.   Mitral Valve: The mitral valve is normal in structure. No evidence of  mitral valve  regurgitation. No evidence of mitral valve stenosis.   Tricuspid Valve: The tricuspid valve is normal in structure. Tricuspid  valve regurgitation is mild . No evidence of tricuspid stenosis.   Aortic Valve: The aortic valve is tricuspid. Aortic valve regurgitation is  not visualized. No aortic stenosis is present.   Pulmonic Valve: The pulmonic valve was normal in structure. Pulmonic valve  regurgitation is not visualized. No evidence of pulmonic stenosis.   Aorta: The aortic root, ascending aorta, aortic arch and  descending aorta  are all structurally normal, with no evidence of dilitation or  obstruction.   Venous: A normal flow pattern is recorded from the right upper pulmonary  vein. The inferior vena cava is normal in size with greater than 50%  respiratory variability, suggesting right atrial pressure of 3 mmHg.   IAS/Shunts: No atrial level shunt detected by color flow Doppler.          Recent Labs: 10/16/2019: TSH 1.000 12/03/2019: ALT 27; Hemoglobin 14.1; Platelet Count 285 04/06/2020: BUN 12; Creatinine, Ser 1.42; Magnesium 2.4; Potassium 4.2; Sodium 141  Recent Lipid Panel    Component Value Date/Time   CHOL 155 11/14/2018 1559   TRIG 123.0 11/14/2018 1559   HDL 28.50 (L) 11/14/2018 1559   CHOLHDL 5 11/14/2018 1559   VLDL 24.6 11/14/2018 1559   LDLCALC 101 (H) 11/14/2018 1559   LDLDIRECT 95.0 08/08/2018 0822    Physical Exam:    VS:  BP 110/80   Pulse 71   Ht 6' (1.829 m)   Wt 210 lb (95.3 kg)   SpO2 98%   BMI 28.48 kg/m     Wt Readings from Last 3 Encounters:  07/23/20 210 lb (95.3 kg)  05/18/20 207 lb (93.9 kg)  05/12/20 208 lb 6.4 oz (94.5 kg)     GEN: Well nourished, well developed in no acute distress HEENT: Normal NECK: No JVD; No carotid bruits LYMPHATICS: No lymphadenopathy CARDIAC: S1S2 noted,RRR, no murmurs, rubs, gallops RESPIRATORY:  Clear to auscultation without rales, wheezing or rhonchi  ABDOMEN: Soft, non-tender, non-distended,  +bowel sounds, no guarding. EXTREMITIES: No edema, No cyanosis, no clubbing MUSCULOSKELETAL:  No deformity  SKIN: Warm and dry NEUROLOGIC:  Alert and oriented x 3, non-focal PSYCHIATRIC:  Normal affect, good insight  ASSESSMENT:    1. High risk medication use   2. Frequent PVCs    PLAN:     1.  Continue his flecainide 50 mg twice daily.  Today we will get flecainide level.  Cardizem down to 120 mg daily.  He really is happy his symptoms has improved significantly still has his quality of life.  The patient is in agreement with the above plan. The patient left the office in stable condition.  The patient will follow up in 1 year or sooner if needed.   Medication Adjustments/Labs and Tests Ordered: Current medicines are reviewed at length with the patient today.  Concerns regarding medicines are outlined above.  Orders Placed This Encounter  Procedures   Flecainide level   Meds ordered this encounter  Medications   diltiazem (CARDIZEM CD) 120 MG 24 hr capsule    Sig: Take 1 capsule (120 mg total) by mouth daily.    Dispense:  90 capsule    Refill:  3    Patient Instructions  Medication Instructions:  START: Diltiazem 120 mg take one tablet by mouth daily.  *If you need a refill on your cardiac medications before your next appointment, please call your pharmacy*   Lab Work: Your physician recommends that you return for lab work in: TODAY Flecainide level If you have labs (blood work) drawn today and your tests are completely normal, you will receive your results only by: [place] (if you have MyChart) OR A paper copy in the mail If you have any lab test that is abnormal or we need to change your treatment, we will call you to review the results.   Testing/Procedures: None   Follow-Up: At [facility], you and your health  needs are our priority.  As part of our continuing mission to provide you with exceptional heart care, we have created designated Provider  Care Teams.  These Care Teams include your primary Cardiologist (physician) and Advanced Practice Providers (APPs -  Physician Assistants and Nurse Practitioners) who all work together to provide you with the care you need, when you need it.  We recommend signing up for the patient portal called "MyChart".  Sign up information is provided on this After Visit Summary.  MyChart is used to connect with patients for Virtual Visits (Telemedicine).  Patients are able to view lab/test results, encounter notes, upcoming appointments, etc.  Non-urgent messages can be sent to your provider as well.   To learn more about what you can do with MyChart, go to [email or web address].    Your next appointment:   1 year(s)  The format for your next appointment:   In Person  Provider:   Berniece Salines, DO   Other Instructions    Adopting a Healthy Lifestyle.  Know what a healthy weight is for you (roughly BMI <25) and aim to maintain this   Aim for 7+ servings of fruits and vegetables daily   65-80+ fluid ounces of water or unsweet tea for healthy kidneys   Limit to max 1 drink of alcohol per day; avoid smoking/tobacco   Limit animal fats in diet for cholesterol and heart health - choose grass fed whenever available   Avoid highly processed foods, and foods high in saturated/trans fats   Aim for low stress - take time to unwind and care for your mental health   Aim for 150 min of moderate intensity exercise weekly for heart health, and weights twice weekly for bone health   Aim for 7-9 hours of sleep daily   When it comes to diets, agreement about the perfect plan isnt easy to find, even among the experts. Experts at the [employer] [place] developed an idea known as the Healthy Eating Plate. Just imagine a plate divided into logical, healthy portions.   The emphasis is on diet quality:   Load up on vegetables and fruits - one-half of your plate: Aim for color and variety, and  remember that potatoes dont count.   Go for whole grains - one-quarter of your plate: Whole wheat, barley, wheat berries, quinoa, oats, brown rice, and foods made with them. If you want pasta, go with whole wheat pasta.   Protein power - one-quarter of your plate: Fish, chicken, beans, and nuts are all healthy, versatile protein sources. Limit red meat.   The diet, however, does go beyond the plate, offering a few other suggestions.   Use healthy plant oils, such as olive, canola, soy, corn, sunflower and peanut. Check the labels, and avoid partially hydrogenated oil, which have unhealthy trans fats.   If youre thirsty, drink water. Coffee and tea are good in moderation, but skip sugary drinks and limit milk and dairy products to one or two daily servings.   The type of carbohydrate in the diet is more important than the amount. Some sources of carbohydrates, such as vegetables, fruits, whole grains, and beans-are healthier than others.   Finally, stay active  Signed, Berniece Salines, DO  07/23/2020 9:37 AM    [place]

## 2020-07-28 LAB — FLECAINIDE LEVEL: Flecainide: <0.1 ug/mL — ABNORMAL LOW (ref 0.20–1.00)

## 2020-08-24 ENCOUNTER — Encounter: Payer: Self-pay | Admitting: Family Medicine

## 2020-08-28 NOTE — Progress Notes (Signed)
Electrophysiology Office Note   Date:  08/30/2020   ID:  Erik Weber, DOB 03/15/1975, MRN [id number]  PCP:  Darreld Mclean, MD  Cardiologist:  [place] Primary Electrophysiologist:  Ammie Warrick Meredith Leeds, MD    Chief Complaint: PVC   History of Present Illness: Erik Weber is a 45 y.o. male who is being seen today for the evaluation of PVC at the request of Copland, Gay Filler, MD. Presenting today for electrophysiology evaluation.  He has a history of hypertension, CKD stage II, sleep apnea, and PVCs.  He is currently on Cardizem.  He was initially having a 12% burden, but after Cardizem the burden was down to 7%.  He continued to have symptoms and has been started on flecainide.  Today, denies symptoms of palpitations, chest pain, shortness of breath, orthopnea, PND, lower extremity edema, claudication, dizziness, presyncope, syncope, bleeding, or neurologic sequela. The patient is tolerating medications without difficulties.  Since being seen he has done well.  He has no chest pain or shortness of breath.  He is not noticed any further PVCs.  He has no complaints today.  He was initially taking his flecainide on a once daily basis and was feeling well.  Flecainide level was checked which was low and he was told to take it twice daily.  This has not changed his overall feeling, and has not changed his PVC symptoms.   Past Medical History:  Diagnosis Date   Chronic renal insufficiency    CKD (chronic kidney disease) stage 3, GFR 30-59 ml/min (HCC) 11/25/2018   Managed by [facility] Dr Erik Weber.  Secondary to hypertensive nephropathy    Disorder resulting from impaired renal function 05/01/2007   Thought due to hypertension, followed by Dr. Florene Glen     Essential hypertension 09/21/2009   Qualifier: Diagnosis of  By: Erik Weber    Fracture of base of fifth metacarpal bone of right hand    GERD 04/04/2007   Qualifier: Diagnosis of  By: Erik Weber    GERD  (gastroesophageal reflux disease)    hx of    Hypertension    Hypertriglyceridemia 05/28/2012   Hypogonadism male 06/07/2011   Iron deficiency anemia secondary to inadequate dietary iron intake 12/04/2019   Morbid obesity ([place])    Obesity 08/18/2013   OSA (obstructive sleep apnea) 10/28/2018   Pneumonitis 10/04/2018   after accidental inhalation of swimming pool water during swimmng lesson   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   Sleep apnea    Mild, no cpap needed   Tinea versicolor 05/19/2013   Past Surgical History:  Procedure Laterality Date   ADENOIDECTOMY     childhood   GASTRIC ROUX-EN-Y N/A 10/28/2018   Procedure: LAPAROSCOPIC ROUX-EN-Y GASTRIC BYPASS WITH UPPER ENDOSCOPY;  Surgeon: Erik Pickerel, MD;  Location: [facility];  Service: General;  Laterality: N/A;   HAND SURGERY Right 02/2016   UPPER GI ENDOSCOPY  03/10/2016   XI ROBOTIC ASSISTED INGUINAL HERNIA REPAIR WITH MESH Left 10/10/2019   Procedure: XI ROBOTIC ASSISTED LEFT INGUINAL HERNIA REPAIR WITH MESH;  Surgeon: Erik Pickerel, MD;  Location: [facility];  Service: General;  Laterality: Left;     Current Outpatient Medications  Medication Sig Dispense Refill   flecainide (TAMBOCOR) 50 MG tablet Take 1 tablet (50 mg total) by mouth daily. 90 tablet 1   Multiple Vitamins-Minerals (MULTIVITAMIN WITH MINERALS) tablet Take 1 tablet by mouth daily.  No current facility-administered medications for this visit.    Allergies:   Patient has no known allergies.   Social History:  The patient  reports that he has never smoked. He has never used smokeless tobacco. He reports that he does not drink alcohol and does not use drugs.   Family History:  The patient's family history includes Diabetes in his father; Hypertension in his father; Kidney cancer (age of onset: 45) in his mother; Lung cancer in his paternal uncle.   ROS:  Please see the history of present illness.   Otherwise, review of systems is  positive for none.   All other systems are reviewed and negative.   PHYSICAL EXAM: VS:  BP 114/80   Pulse (!) 58   Ht 6' (1.829 m)   Wt 212 lb 9.6 oz (96.4 kg)   SpO2 98%   BMI 28.83 kg/m  , BMI Body mass index is 28.83 kg/m. GEN: Well nourished, well developed, in no acute distress  HEENT: normal  Neck: no JVD, carotid bruits, or masses Cardiac: RRR; no murmurs, rubs, or gallops,no edema  Respiratory:  clear to auscultation bilaterally, normal work of breathing GI: soft, nontender, nondistended, + BS MS: no deformity or atrophy  Skin: warm and dry Neuro:  Strength and sensation are intact Psych: euthymic mood, full affect  EKG:  EKG is ordered today. Personal review of the ekg ordered shows sinus rhythm, rate 58  Recent Labs: 10/16/2019: TSH 1.000 12/03/2019: ALT 27; Hemoglobin 14.1; Platelet Count 285 04/06/2020: BUN 12; Creatinine, Ser 1.42; Magnesium 2.4; Potassium 4.2; Sodium 141    Lipid Panel     Component Value Date/Time   CHOL 155 11/14/2018 1559   TRIG 123.0 11/14/2018 1559   HDL 28.50 (L) 11/14/2018 1559   CHOLHDL 5 11/14/2018 1559   VLDL 24.6 11/14/2018 1559   LDLCALC 101 (H) 11/14/2018 1559   LDLDIRECT 95.0 08/08/2018 0822     Wt Readings from Last 3 Encounters:  08/30/20 212 lb 9.6 oz (96.4 kg)  07/23/20 210 lb (95.3 kg)  05/18/20 207 lb (93.9 kg)      Other studies Reviewed: Additional studies/ records that were reviewed today include: TTE 02/25/19  Review of the above records today demonstrates:   1. Frequent PVC's. Left ventricular ejection fraction, by estimation, is  55 to 60%. The left ventricle has normal function. The left ventricle has  no regional wall motion abnormalities. There is mild concentric left  ventricular hypertrophy. Left  ventricular diastolic parameters were normal.   2. Right ventricular systolic function is normal. The right ventricular  size is normal. There is normal pulmonary artery systolic pressure.   3. The mitral  valve is normal in structure. No evidence of mitral valve  regurgitation. No evidence of mitral stenosis.   4. The aortic valve is tricuspid. Aortic valve regurgitation is not  visualized. No aortic stenosis is present.   5. The inferior vena cava is normal in size with greater than 50%  respiratory variability, suggesting right atrial pressure of 3 mmHg.  Cardiac monitor 05/14/2020 personally reviewed Max 179 bpm 11:26am, 03/26 Min 29 bpm 09:13pm, 03/25 Avg 72 bpm Less than 1% supraventricular ectopy 7.1% ventricular ectopy  ASSESSMENT AND PLAN:  1.  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  High risk medication monitoring.  Burden is down to 7% prior to addition of flecainide..  Ejection fraction has remained normal.  He is currently feeling well.  He has no chest pain or shortness of breath.  He would like to reduce his dose to once a day as he was feeling well on that dose.  He would also like to stop his diltiazem.  2.  Mobitz 1 AV block: We Jer[place]o C[place] stop diltiazem due to Mobitz 1 AV block.  If he does have further atrial arrhythmias, may need to restart this.  Current medicines are reviewed at length with the patient today.   The patient does not have concerns regarding his medicines.  The following changes were made today: Once daily flecainide, stop diltiazem  Labs/ tests ordered today include:  Orders Placed This Encounter  Procedures   EKG 12-Lead      Disposition:   FU with Lillyauna Jenkinson 6 months  Signed, Ethan Kasperski Meredith Leeds, MD  08/30/2020 9:29 AM     [place] [address] [place] [address] [phone] (office) [phone] (fax)

## 2020-08-30 ENCOUNTER — Ambulatory Visit: Payer: BC Managed Care – PPO | Admitting: Cardiology

## 2020-08-30 ENCOUNTER — Encounter: Payer: Self-pay | Admitting: Cardiology

## 2020-08-30 ENCOUNTER — Other Ambulatory Visit: Payer: Self-pay

## 2020-08-30 VITALS — BP 114/80 | HR 58 | Ht 72.0 in | Wt 212.6 lb

## 2020-08-30 DIAGNOSIS — I493 Ventricular premature depolarization: Secondary | ICD-10-CM

## 2020-08-30 MED ORDER — FLECAINIDE ACETATE 50 MG PO TABS: 50.0000 mg | ORAL_TABLET | Freq: Every day | ORAL | 1 refills | Status: DC

## 2020-08-30 NOTE — Patient Instructions (Signed)
Medication Instructions:  Your physician has recommended you make the following change in your medication:  STOP Diltiazem DECREASE Flecainide to 50 mg once daily  *If you need a refill on your cardiac medications before your next appointment, please call your pharmacy*   Lab Work: None ordered   Testing/Procedures: None ordered   Follow-Up: At [facility], you and your health needs are our priority.  As part of our continuing mission to provide you with exceptional heart care, we have created designated Provider Care Teams.  These Care Teams include your primary Cardiologist (physician) and Advanced Practice Providers (APPs -  Physician Assistants and Nurse Practitioners) who all work together to provide you with the care you need, when you need it.  Your next appointment:   6 month(s)  The format for your next appointment:   In Person  Provider:   Dr. Curt Bears    Thank you for choosing [facility]!!   Trinidad Curet, RN [phone]

## 2020-09-02 NOTE — Patient Instructions (Addendum)
It was great to see you again today! I will be in touch with your labs  Keep up the good work with exercise

## 2020-09-02 NOTE — Progress Notes (Addendum)
[place] at [facility] [address], [place], [place] [address] [phone]  Date:  09/03/2020   Name:  Erik Weber   DOB:  16-Jul-1975   MRN:  [id number]  PCP:  Darreld Mclean, MD    Chief Complaint: Annual Exam (CPE- not fasting )   History of Present Illness:  Erik Weber is a 45 y.o. very pleasant male patient who presents with the following:  Patient here today for physical exam Last visit with myself in April History of chronic kidney disease, sleep apnea, hypertension, gastric bypass in September 2020, [place] trait  Married to [place] who is also my patient- we briefly discussed the current monkeypox outbreak.  They are in a sexually exclusive relationship so should not be a concern   Can give prevnar 20- discussed, he declines for now  Covid booster- yesterday  Cologuard last year - negative BW done earlier this year Can update lipid profile and a few other labs   He has recently gotten back to the gym for a regular exercise program. Discussed SS trait- be sure to stay hydrated and gradually increase exercise as fitness allows   Nephrology per [place] recent visit in June, Dr Carolin Sicks - all ok, recheck one year  Primary cardiologist is Dr.Tobb.  Most recent visit in June.  He recently had a coronary CTA which showed no evidence of CAD He is also seeing electrophysiology, Dr. Lillard Anes most recent visit earlier this month: 1.  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  High risk medication monitoring.  Burden is down to 7% prior to addition of flecainide..  Ejection fraction has remained normal.  He is currently feeling well.  He has no chest pain or shortness of breath.  He would like to reduce his dose to once a day as he was feeling well on that dose.  He would also like to stop his diltiazem. 2.  Mobitz 1 AV block: We will stop diltiazem due to Mobitz 1 AV block.  If he does have further  atrial arrhythmias, may need to restart this. 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  Secondary to hypertensive nephropathy    Disorder resulting from impaired renal function 05/01/2007   Thought due to hypertension, followed by Dr. Florene Glen     Essential hypertension 09/21/2009   Qualifier: Diagnosis of  By: Wynona Luna    Fracture of base of fifth metacarpal bone of right hand  GERD 04/04/2007   Qualifier: Diagnosis of  By: Wynona Luna    GERD (gastroesophageal reflux disease)    hx of    Hypertension    Hypertriglyceridemia 05/28/2012   Hypogonadism male 06/07/2011   Iron deficiency anemia secondary to inadequate dietary iron intake 12/04/2019   Morbid obesity ([place])    Obesity 08/18/2013   OSA (obstructive sleep apnea) 10/28/2018   Pneumonitis 10/04/2018   after accidental inhalation of swimming pool water during swimmng lesson   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   Sleep apnea    Mild, no cpap needed   Tinea  versicolor 05/19/2013    Past Surgical History:  Procedure Laterality Date   ADENOIDECTOMY     childhood   GASTRIC ROUX-EN-Y N/A 10/28/2018   Procedure: LAPAROSCOPIC ROUX-EN-Y GASTRIC BYPASS WITH UPPER ENDOSCOPY;  Surgeon: Greer Pickerel, MD;  Location: [facility];  Service: General;  Laterality: N/A;   HAND SURGERY Right 02/2016   UPPER GI ENDOSCOPY  03/10/2016   XI ROBOTIC ASSISTED INGUINAL HERNIA REPAIR WITH MESH Left 10/10/2019   Procedure: XI ROBOTIC ASSISTED LEFT INGUINAL HERNIA REPAIR WITH MESH;  Surgeon: Greer Pickerel, MD;  Location: [facility];  Service: General;  Laterality: Left;    Social History   Tobacco Use   Smoking status: Never   Smokeless tobacco: Never  Vaping Use   Vaping Use: Never used  Substance Use Topics   Alcohol use: No    Alcohol/week: 0.0 standard drinks   Drug use: No    Family History  Problem Relation Age of Onset   Kidney cancer Mother 41   Hypertension Father        age 58   Diabetes Father    Lung cancer Paternal Uncle    Colon cancer Neg Hx    Prostate cancer Neg Hx    Liver disease Neg Hx    Esophageal cancer Neg Hx    Stomach cancer Neg Hx    Pancreatic cancer Neg Hx     No Known Allergies  Medication list has been reviewed and updated.  Current Outpatient Medications on File Prior to Visit  Medication Sig Dispense Refill   flecainide (TAMBOCOR) 50 MG tablet Take 1 tablet (50 mg total) by mouth daily. 90 tablet 1   Multiple Vitamins-Minerals (MULTIVITAMIN WITH MINERALS) tablet Take 1 tablet by mouth daily.     No current facility-administered medications on file prior to visit.    Review of Systems:  As per HPI- otherwise negative.   Physical Examination: Vitals:   09/03/20 1312  BP: 122/80  Pulse: 61  Temp: 98.3 F (36.8 C)  SpO2: 99%   Vitals:   09/03/20 1312  Weight: 213 lb 3.2 oz (96.7 kg)  Height: 6' (1.829 m)   Body mass index is 28.92 kg/m. Ideal Body Weight: Weight in (lb) to have BMI = 25: 183.9  GEN: no  acute distress.  Mild overweight, looks well  HEENT: Atraumatic, Normocephalic.  Bilateral TM wnl, oropharynx normal.  PEERL,EOMI.  There is some inflammation of the right ear canal- he has been swimming recently  Ears and Nose: No external deformity. CV: RRR, No M/G/R. No JVD. No thrill. No extra heart sounds. PULM: CTA B, no wheezes, crackles, rhonchi. No retractions. No resp. distress. No accessory muscle use. ABD: S, NT, ND, +BS. No rebound. No HSM. EXTR: No c/c/e PSYCH: Normally interactive. Conversant.    Assessment and Plan: Physical exam  Screening for diabetes mellitus - Plan: Hemoglobin A1c  Screening for hyperlipidemia - Plan: Lipid panel  Medication monitoring encounter - Plan: Hepatic function panel  Thiamine deficiency - Plan: Vitamin B1  Patient seen today for physical exam.  Encouraged continued healthy diet and exercise routine.  he is being treated by electrophysiology for PVCs, significant improvement of symptoms.  He is now taking flecainide just once a day  We discussed doing a Prevnar 20, he does have chronic kidney disease and sickle cell trait.  He declines for the time being  COVID booster done yesterday  Labs are pending as above- Will plan further follow- up pending labs.  This visit occurred during the SARS-CoV-2 public health emergency.  Safety protocols were in place, including screening questions prior to the visit, additional usage of staff PPE, and extensive cleaning of exam room while observing appropriate contact time as indicated for disinfecting solutions.   Signed Lamar Blinks, MD  Received labs as below, message to patient  Results for orders placed or performed in visit on 09/03/20  Lipid panel  Result Value Ref Range   Cholesterol 165 0 - 200 mg/dL   Triglycerides 235.0 (H) 0.0 - 149.0 mg/dL   HDL 45.80 >39.00 mg/dL   VLDL 47.0 (H) 0.0 - 40.0 mg/dL   Total CHOL/HDL Ratio 4    NonHDL 118.91   Hepatic function panel  Result Value  Ref Range   Total Bilirubin 0.5 0.2 - 1.2 mg/dL   Bilirubin, Direct 0.1 0.0 - 0.3 mg/dL   Alkaline Phosphatase 86 39 - 117 U/L   AST 22 0 - 37 U/L   ALT 25 0 - 53 U/L   Total Protein 7.1 6.0 - 8.3 g/dL   Albumin 4.4 3.5 - 5.2 g/dL  Hemoglobin A1c  Result Value Ref Range   Hgb A1c MFr Bld 4.7 4.6 - 6.5 %  LDL cholesterol, direct  Result Value Ref Range   Direct LDL 78.0 mg/dL   The 10-year ASCVD risk score Mikey Bussing DC Jr., et al., 2013) is: 3.7%   Values used to calculate the score:     Age: 42 years     Sex: Male     Is Non-Hispanic African American: Yes     Diabetic: No     Tobacco smoker: No     Systolic Blood Pressure: 368 mmHg     Is BP treated: No     HDL Cholesterol: 45.8 mg/dL     Total Cholesterol: 165 mg/dL

## 2020-09-03 ENCOUNTER — Encounter: Payer: Self-pay | Admitting: Family Medicine

## 2020-09-03 ENCOUNTER — Ambulatory Visit (INDEPENDENT_AMBULATORY_CARE_PROVIDER_SITE_OTHER): Payer: BC Managed Care – PPO | Admitting: Family Medicine

## 2020-09-03 ENCOUNTER — Other Ambulatory Visit: Payer: Self-pay

## 2020-09-03 VITALS — BP 122/80 | HR 61 | Temp 98.3°F | Ht 72.0 in | Wt 213.2 lb

## 2020-09-03 DIAGNOSIS — Z5181 Encounter for therapeutic drug level monitoring: Secondary | ICD-10-CM

## 2020-09-03 DIAGNOSIS — Z13220 Encounter for screening for lipoid disorders: Secondary | ICD-10-CM

## 2020-09-03 DIAGNOSIS — E519 Thiamine deficiency, unspecified: Secondary | ICD-10-CM

## 2020-09-03 DIAGNOSIS — Z0000 Encounter for general adult medical examination without abnormal findings: Secondary | ICD-10-CM

## 2020-09-03 DIAGNOSIS — Z131 Encounter for screening for diabetes mellitus: Secondary | ICD-10-CM

## 2020-09-03 DIAGNOSIS — H60331 Swimmer's ear, right ear: Secondary | ICD-10-CM

## 2020-09-03 LAB — HEPATIC FUNCTION PANEL
ALT: 25 U/L (ref 0–53)
AST: 22 U/L (ref 0–37)
Albumin: 4.4 g/dL (ref 3.5–5.2)
Alkaline Phosphatase: 86 U/L (ref 39–117)
Bilirubin, Direct: 0.1 mg/dL (ref 0.0–0.3)
Total Bilirubin: 0.5 mg/dL (ref 0.2–1.2)
Total Protein: 7.1 g/dL (ref 6.0–8.3)

## 2020-09-03 LAB — LDL CHOLESTEROL, DIRECT: Direct LDL: 78 mg/dL

## 2020-09-03 LAB — HEMOGLOBIN A1C: Hgb A1c MFr Bld: 4.7 % (ref 4.6–6.5)

## 2020-09-03 LAB — LIPID PANEL
Cholesterol: 165 mg/dL (ref 0–200)
HDL: 45.8 mg/dL (ref >39.00)
NonHDL: 118.91
Total CHOL/HDL Ratio: 4
Triglycerides: 235 mg/dL — ABNORMAL HIGH (ref 0.0–149.0)
VLDL: 47 mg/dL — ABNORMAL HIGH (ref 0.0–40.0)

## 2020-09-03 MED ORDER — OFLOXACIN 0.3 % OT SOLN: 10.0000 [drp] | Freq: Every day | OTIC | 0 refills | Status: DC

## 2020-09-08 LAB — VITAMIN B1: Vitamin B1 (Thiamine): 16 nmol/L (ref 8–30)

## 2020-09-30 ENCOUNTER — Ambulatory Visit (INDEPENDENT_AMBULATORY_CARE_PROVIDER_SITE_OTHER): Payer: BC Managed Care – PPO | Admitting: Family Medicine

## 2020-09-30 ENCOUNTER — Other Ambulatory Visit: Payer: Self-pay

## 2020-09-30 VITALS — BP 122/80 | HR 54 | Temp 97.3°F | Resp 18 | Ht 72.0 in | Wt 212.0 lb

## 2020-09-30 DIAGNOSIS — J029 Acute pharyngitis, unspecified: Secondary | ICD-10-CM

## 2020-09-30 DIAGNOSIS — Z23 Encounter for immunization: Secondary | ICD-10-CM

## 2020-09-30 LAB — POCT RAPID STREP A (OFFICE): Rapid Strep A Screen: NEGATIVE

## 2020-09-30 NOTE — Progress Notes (Signed)
[place] at [facility] [address] [place], [place] [address] [phone]  Date:  09/30/2020   Name:  Erik Weber   DOB:  Nov 03, 1975   MRN:  [id number]  PCP:  Darreld Mclean, MD    Chief Complaint: Sore Throat (Started last week, discomfort when swallowing, neg covid)   History of Present Illness:  Erik Weber is a 45 y.o. very pleasant male patient who presents with the following:  Visit today for pharyngitis/feeling of something caught in throat.  He has tested for COVID and been negative  Last visit with myself was in July for physical- History of chronic kidney disease, sleep apnea, hypertension, gastric bypass in September 2020, SS trait  He is followed by nephrology at [facility] Also followed by cardiology and electrophysiology  He notes onset of ST about one week ago He thought it might be due to air vent blowing on him It is still painful to swallow More on the left This am he felt like he had something stuck in his throat He is otherwise feeling well No cough He tested for covid- negative   Patient Active Problem List   Diagnosis Date Noted   Sickle cell trait ([place]) 05/12/2020   Chronic renal insufficiency    Fracture of base of fifth metacarpal bone of right hand    GERD (gastroesophageal reflux disease)    Hypertension    Morbid obesity ([place])    Positive TB test    Sleep apnea    Iron deficiency anemia secondary to inadequate dietary iron intake 12/04/2019   CKD (chronic kidney disease) stage 3, GFR 30-59 ml/min ([place]) 11/25/2018   OSA (obstructive sleep apnea) 10/28/2018   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  Secondary to hypertensive nephropathy    Disorder resulting from impaired renal function 05/01/2007   Thought due to hypertension, followed by Dr. Florene Glen     Essential hypertension 09/21/2009   Qualifier: Diagnosis of  By: Wynona Luna    Fracture of base of fifth metacarpal bone of right hand    GERD 04/04/2007   Qualifier: Diagnosis of  By: Wynona Luna    GERD (gastroesophageal reflux disease)    hx of    Hypertension    Hypertriglyceridemia 05/28/2012   Hypogonadism male 06/07/2011   Iron deficiency anemia secondary to inadequate dietary iron intake 12/04/2019   Morbid obesity ([place])    Obesity 08/18/2013   OSA (obstructive sleep apnea) 10/28/2018   Pneumonitis 10/04/2018   after accidental inhalation of swimming pool water during swimmng lesson   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   Sleep apnea    Mild, no cpap needed   Tinea versicolor 05/19/2013    Past Surgical History:  Procedure Laterality Date   ADENOIDECTOMY     childhood   GASTRIC ROUX-EN-Y N/A 10/28/2018   Procedure: LAPAROSCOPIC ROUX-EN-Y GASTRIC BYPASS WITH UPPER ENDOSCOPY;  Surgeon: Greer Pickerel, MD;  [facility];  Service: General;  Laterality: N/A;   HAND SURGERY Right 02/2016   UPPER GI ENDOSCOPY  03/10/2016   XI ROBOTIC ASSISTED INGUINAL HERNIA REPAIR WITH MESH Left 10/10/2019   Procedure: XI ROBOTIC ASSISTED LEFT INGUINAL HERNIA REPAIR WITH MESH;  Surgeon: Greer Pickerel, MD;  Location: [facility];  Service: General;  Laterality: Left;    Social History   Tobacco Use   Smoking status: Never   Smokeless tobacco: Never  Vaping Use   Vaping Use: Never used  Substance Use Topics   Alcohol use: No    Alcohol/week: 0.0 standard drinks   Drug use: No    Family History  Problem Relation Age of Onset    Kidney cancer Mother 39   Hypertension Father        age 7   Diabetes Father    Lung cancer Paternal Uncle    Colon cancer Neg Hx    Prostate cancer Neg Hx    Liver disease Neg Hx    Esophageal cancer Neg Hx    Stomach cancer Neg Hx    Pancreatic cancer Neg Hx     No Known Allergies  Medication list has been reviewed and updated.  Current Outpatient Medications on File Prior to Visit  Medication Sig Dispense Refill   flecainide (TAMBOCOR) 50 MG tablet Take 1 tablet (50 mg total) by mouth daily. 90 tablet 1   Multiple Vitamins-Minerals (MULTIVITAMIN WITH MINERALS) tablet Take 1 tablet by mouth daily.     ofloxacin (FLOXIN OTIC) 0.3 % OTIC solution Place 10 drops into the right ear daily. Use for 7- 10 days as needed for ear infection 5 mL 0   No current facility-administered medications on file prior to visit.    Review of Systems:  As per HPI- otherwise negative.  Pulse Readings from Last 3 Encounters:  09/30/20 (!) 54  09/03/20 61  08/30/20 (!) 58    Physical Examination: Vitals:   09/30/20 1532  BP: 122/80  Pulse: (!) 54  Resp: 18  Temp: (!) 97.3 F (36.3 C)  SpO2: 99%   Vitals:   09/30/20 1532  Weight: 212 lb (96.2 kg)  Height: 6' (1.829 m)   Body mass index is 28.75 kg/m. Ideal Body Weight: Weight in (lb) to have BMI = 25: 183.9  GEN: no acute distress.  Looks well, mild overweight  HEENT: Atraumatic, Normocephalic.   Bilateral TM wnl, oropharynx erythematous but no exudate, PEERL,EOMI.   Ears and Nose: No external deformity. CV: RRR, No M/G/R. No JVD. No thrill. No extra heart sounds. PULM: CTA B, no wheezes, crackles, rhonchi. No retractions. No resp. distress. No accessory muscle use. EXTR: No c/c/e PSYCH: Normally interactive. Conversant.   Results for orders placed or performed in visit on 09/30/20  POCT rapid strep A  Result Value Ref Range   Rapid Strep A Screen Negative Negative    Assessment and Plan: Pharyngitis, unspecified  etiology - Plan: POCT rapid strep A  Immunization due - Plan: Flu Vaccine QUAD 6+ mos PF IM (Fluarix Quad PF) Negative for strep today Suspect allergies or viral illness- gave treatment suggestions  He will let me know if sx persist  Flu shot today Discussed moneypox- he should not be at high risk but will continue to monitor CDC recommendations    This visit occurred during the SARS-CoV-2 public health emergency.  Safety protocols were in place, including screening questions prior to the visit, additional usage of staff PPE, and extensive cleaning  of exam room while observing appropriate contact time as indicated for disinfecting solutions.   Signed Lamar Blinks, MD

## 2020-09-30 NOTE — Patient Instructions (Addendum)
It was good to see you today!   Let me know if your throat is not feeling better soon  Flu shot today  Try some allergy medication such as claritin or zyrtec for your throat

## 2020-10-15 ENCOUNTER — Ambulatory Visit: Payer: BC Managed Care – PPO | Admitting: Cardiology

## 2020-11-15 ENCOUNTER — Encounter: Payer: Self-pay | Admitting: Family

## 2020-11-16 ENCOUNTER — Encounter: Payer: Self-pay | Admitting: Family

## 2020-11-23 ENCOUNTER — Ambulatory Visit: Payer: BC Managed Care – PPO | Admitting: Cardiology

## 2020-12-25 ENCOUNTER — Emergency Department (HOSPITAL_BASED_OUTPATIENT_CLINIC_OR_DEPARTMENT_OTHER): Payer: Medicaid Other

## 2020-12-25 ENCOUNTER — Emergency Department (HOSPITAL_BASED_OUTPATIENT_CLINIC_OR_DEPARTMENT_OTHER)
Admission: EM | Admit: 2020-12-25 | Discharge: 2020-12-25 | Disposition: A | Discharge status: Home / Self Care | Payer: Medicaid Other | Attending: Emergency Medicine | Admitting: Emergency Medicine

## 2020-12-25 ENCOUNTER — Encounter (HOSPITAL_BASED_OUTPATIENT_CLINIC_OR_DEPARTMENT_OTHER): Payer: Self-pay | Admitting: Emergency Medicine

## 2020-12-25 ENCOUNTER — Encounter: Payer: Self-pay | Admitting: Family

## 2020-12-25 ENCOUNTER — Other Ambulatory Visit: Payer: Self-pay

## 2020-12-25 DIAGNOSIS — S62102A Fracture of unspecified carpal bone, left wrist, initial encounter for closed fracture: Secondary | ICD-10-CM

## 2020-12-25 DIAGNOSIS — S52502A Unspecified fracture of the lower end of left radius, initial encounter for closed fracture: Secondary | ICD-10-CM | POA: Diagnosis not present

## 2020-12-25 DIAGNOSIS — W11XXXA Fall on and from ladder, initial encounter: Secondary | ICD-10-CM | POA: Diagnosis not present

## 2020-12-25 DIAGNOSIS — S52321A Displaced transverse fracture of shaft of right radius, initial encounter for closed fracture: Secondary | ICD-10-CM | POA: Diagnosis not present

## 2020-12-25 DIAGNOSIS — S0990XA Unspecified injury of head, initial encounter: Secondary | ICD-10-CM | POA: Insufficient documentation

## 2020-12-25 DIAGNOSIS — W19XXXA Unspecified fall, initial encounter: Secondary | ICD-10-CM

## 2020-12-25 DIAGNOSIS — I129 Hypertensive chronic kidney disease with stage 1 through stage 4 chronic kidney disease, or unspecified chronic kidney disease: Secondary | ICD-10-CM | POA: Diagnosis not present

## 2020-12-25 DIAGNOSIS — S6992XA Unspecified injury of left wrist, hand and finger(s), initial encounter: Secondary | ICD-10-CM | POA: Diagnosis present

## 2020-12-25 DIAGNOSIS — S62101A Fracture of unspecified carpal bone, right wrist, initial encounter for closed fracture: Secondary | ICD-10-CM

## 2020-12-25 DIAGNOSIS — Z20822 Contact with and (suspected) exposure to covid-19: Secondary | ICD-10-CM | POA: Diagnosis not present

## 2020-12-25 DIAGNOSIS — S00531A Contusion of lip, initial encounter: Secondary | ICD-10-CM | POA: Diagnosis not present

## 2020-12-25 DIAGNOSIS — S52501A Unspecified fracture of the lower end of right radius, initial encounter for closed fracture: Secondary | ICD-10-CM | POA: Insufficient documentation

## 2020-12-25 DIAGNOSIS — S52612A Displaced fracture of left ulna styloid process, initial encounter for closed fracture: Secondary | ICD-10-CM | POA: Diagnosis not present

## 2020-12-25 DIAGNOSIS — S52352A Displaced comminuted fracture of shaft of radius, left arm, initial encounter for closed fracture: Secondary | ICD-10-CM | POA: Diagnosis not present

## 2020-12-25 DIAGNOSIS — N183 Chronic kidney disease, stage 3 unspecified: Secondary | ICD-10-CM | POA: Insufficient documentation

## 2020-12-25 DIAGNOSIS — R600 Localized edema: Secondary | ICD-10-CM | POA: Diagnosis not present

## 2020-12-25 DIAGNOSIS — S62162A Displaced fracture of pisiform, left wrist, initial encounter for closed fracture: Secondary | ICD-10-CM | POA: Diagnosis not present

## 2020-12-25 LAB — COMPREHENSIVE METABOLIC PANEL
ALT: 26 U/L (ref 0–44)
AST: 25 U/L (ref 15–41)
Albumin: 4.6 g/dL (ref 3.5–5.0)
Alkaline Phosphatase: 95 U/L (ref 38–126)
Anion gap: 8 (ref 5–15)
BUN: 12 mg/dL (ref 6–20)
CO2: 27 mmol/L (ref 22–32)
Calcium: 9 mg/dL (ref 8.9–10.3)
Chloride: 103 mmol/L (ref 98–111)
Creatinine, Ser: 1.31 mg/dL — ABNORMAL HIGH (ref 0.61–1.24)
GFR, Estimated: >60 mL/min (ref >60)
Glucose, Bld: 95 mg/dL (ref 70–99)
Potassium: 4.4 mmol/L (ref 3.5–5.1)
Sodium: 138 mmol/L (ref 135–145)
Total Bilirubin: 0.7 mg/dL (ref 0.3–1.2)
Total Protein: 7.8 g/dL (ref 6.5–8.1)

## 2020-12-25 LAB — URINALYSIS, ROUTINE W REFLEX MICROSCOPIC
Bilirubin Urine: NEGATIVE
Glucose, UA: NEGATIVE mg/dL
Hgb urine dipstick: NEGATIVE
Ketones, ur: NEGATIVE mg/dL
Leukocytes,Ua: NEGATIVE
Nitrite: NEGATIVE
Protein, ur: NEGATIVE mg/dL
Specific Gravity, Urine: 1.015 (ref 1.005–1.030)
pH: 5.5 (ref 5.0–8.0)

## 2020-12-25 LAB — CBC
HCT: 47.1 % (ref 39.0–52.0)
Hemoglobin: 15.9 g/dL (ref 13.0–17.0)
MCH: 29.2 pg (ref 26.0–34.0)
MCHC: 33.8 g/dL (ref 30.0–36.0)
MCV: 86.6 fL (ref 80.0–100.0)
Platelets: 273 10*3/uL (ref 150–400)
RBC: 5.44 MIL/uL (ref 4.22–5.81)
RDW: 11.5 % (ref 11.5–15.5)
WBC: 15.4 10*3/uL — ABNORMAL HIGH (ref 4.0–10.5)
nRBC: 0 % (ref 0.0–0.2)

## 2020-12-25 LAB — RESP PANEL BY RT-PCR (FLU A&B, COVID) ARPGX2
Influenza A by PCR: NEGATIVE
Influenza B by PCR: NEGATIVE
SARS Coronavirus 2 by RT PCR: NEGATIVE

## 2020-12-25 MED ORDER — ONDANSETRON HCL 4 MG/2ML IJ SOLN
4.0000 mg | Freq: Once | INTRAMUSCULAR | Status: AC
  Administered 2020-12-25: 4 mg via INTRAVENOUS
  Filled 2020-12-25: qty 2

## 2020-12-25 MED ORDER — HYDROMORPHONE HCL 1 MG/ML IJ SOLN
0.5000 mg | Freq: Once | INTRAMUSCULAR | Status: AC
  Administered 2020-12-25: 0.5 mg via INTRAVENOUS
  Filled 2020-12-25: qty 1

## 2020-12-25 MED ORDER — OXYCODONE HCL 5 MG PO TABS: 2.5000 mg | ORAL_TABLET | Freq: Four times a day (QID) | ORAL | 0 refills | Status: DC | PRN

## 2020-12-25 MED ORDER — MORPHINE SULFATE (PF) 4 MG/ML IV SOLN
4.0000 mg | Freq: Once | INTRAVENOUS | Status: AC
Start: 2020-12-25 — End: 2020-12-25
  Administered 2020-12-25: 4 mg via INTRAVENOUS
  Filled 2020-12-25: qty 1

## 2020-12-25 MED ORDER — LIDOCAINE HCL (PF) 1 % IJ SOLN
30.0000 mL | Freq: Once | INTRAMUSCULAR | Status: AC
  Administered 2020-12-25: 30 mL
  Filled 2020-12-25: qty 30

## 2020-12-25 NOTE — ED Notes (Signed)
ED Provider at bedside. 

## 2020-12-25 NOTE — ED Notes (Signed)
Patient transported to CT 

## 2020-12-25 NOTE — ED Notes (Signed)
X-ray at bedside

## 2020-12-25 NOTE — ED Provider Notes (Signed)
[facility] EMERGENCY DEPARTMENT Provider Note   CSN: [id number] Arrival date & time: 12/25/20  1048     History Chief Complaint  Patient presents with   Erik Weber Erik Weber is a 45 y.o. male who presents emergency department with a chief complaint of fall.  Patient states that he was trying to fix a flood light outside on his ladder when the ladder fell.  He fell forward onto bilateral outstretched hands.  He hit his lip on the concrete.  He did not lose consciousness and denies pain in his teeth or jaw.  He complains of bilateral pain in his hands and deformed his.  He denies any numbness or tingling.  He is right-hand dominant.   Fall      Past Medical History:  Diagnosis Date   Chronic renal insufficiency    CKD (chronic kidney disease) stage 3, GFR 30-59 ml/min (HCC) 11/25/2018   Managed by [facility] Dr Carolin Sicks.  Secondary to hypertensive nephropathy    Disorder resulting from impaired renal function 05/01/2007   Thought due to hypertension, followed by Dr. Florene Glen     Essential hypertension 09/21/2009   Qualifier: Diagnosis of  By: Wynona Luna    Fracture of base of fifth metacarpal bone of right hand    GERD 04/04/2007   Qualifier: Diagnosis of  By: Wynona Luna    GERD (gastroesophageal reflux disease)    hx of    Hypertension    Hypertriglyceridemia 05/28/2012   Hypogonadism male 06/07/2011   Iron deficiency anemia secondary to inadequate dietary iron intake 12/04/2019   Morbid obesity ([place])    Obesity 08/18/2013   OSA (obstructive sleep apnea) 10/28/2018   Pneumonitis 10/04/2018   after accidental inhalation of swimming pool water during swimmng lesson   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   Sleep apnea    Mild, no cpap needed   Tinea versicolor 05/19/2013    Patient Active Problem List   Diagnosis Date Noted   Sickle cell trait ([place]) 05/12/2020   Chronic renal insufficiency    Fracture of base  of fifth metacarpal bone of right hand    GERD (gastroesophageal reflux disease)    Hypertension    Morbid obesity ([place])    Positive TB test    Sleep apnea    Iron deficiency anemia secondary to inadequate dietary iron intake 12/04/2019   CKD (chronic kidney disease) stage 3, GFR 30-59 ml/min (HCC) 11/25/2018   OSA (obstructive sleep apnea) 10/28/2018   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   Pneumonitis 10/04/2018   Obesity 08/18/2013   Tinea versicolor 05/19/2013   Hypertriglyceridemia 05/28/2012   Hypogonadism male 06/07/2011   Essential hypertension 09/21/2009   Disorder resulting from impaired renal function 05/01/2007   GERD 04/04/2007    Past Surgical History:  Procedure Laterality Date   ADENOIDECTOMY     childhood   GASTRIC ROUX-EN-Y N/A 10/28/2018   Procedure: LAPAROSCOPIC ROUX-EN-Y GASTRIC BYPASS WITH UPPER ENDOSCOPY;  Surgeon: Greer Pickerel, MD;  Location: [facility];  Service: General;  Laterality: N/A;   HAND SURGERY Right 02/2016   UPPER GI ENDOSCOPY  03/10/2016   XI ROBOTIC ASSISTED INGUINAL HERNIA REPAIR WITH MESH Left 10/10/2019   Procedure: XI ROBOTIC ASSISTED LEFT INGUINAL HERNIA REPAIR WITH MESH;  Surgeon: Greer Pickerel, MD;  Location: [facility];  Service: General;  Laterality: Left;       Family History  Problem Relation Age of Onset   Kidney cancer Mother 21   Hypertension Father        age 61   Diabetes Father    Lung cancer Paternal Uncle    Colon cancer Neg Hx    Prostate cancer Neg Hx    Liver disease Neg Hx    Esophageal cancer Neg Hx    Stomach cancer Neg Hx    Pancreatic cancer Neg Hx     Social History   Tobacco Use   Smoking status: Never   Smokeless tobacco: Never  Vaping Use   Vaping Use: Never used  Substance Use Topics   Alcohol use: No    Alcohol/week: 0.0 standard drinks   Drug use: No    Home Medications Prior to Admission medications   Medication Sig Start Date End Date Taking? Authorizing Provider  flecainide (TAMBOCOR) 50 MG  tablet Take 1 tablet (50 mg total) by mouth daily. 08/30/20   Camnitz, Ocie Doyne, MD  Multiple Vitamins-Minerals (MULTIVITAMIN WITH MINERALS) tablet Take 1 tablet by mouth daily.    [provider]  ofloxacin (FLOXIN OTIC) 0.3 % OTIC solution Place 10 drops into the right ear daily. Use for 7- 10 days as needed for ear infection 09/03/20   Copland, Gay Filler, MD    Allergies    Patient has no known allergies.  Review of Systems   Review of Systems Ten systems reviewed and are negative for acute change, except as noted in the HPI.   Physical Exam Updated Vital Signs BP (!) 177/93 (BP Location: Right Leg) Comment (BP Location): Moved to RLE due to increased swelling to R wrist  Pulse (!) 58   Temp 98.2 F (36.8 C) (Oral)   Resp 13   Ht 6' (1.829 m)   Wt 94.3 kg   SpO2 100%   BMI 28.21 kg/m   Physical Exam Vitals and nursing note reviewed.  Constitutional:      General: He is not in acute distress.    Appearance: He is well-developed. He is not diaphoretic.  HENT:     Head: Normocephalic.     Comments: Right lower lip with swelling and bruising, no lacerations, teeth are intact, no malocclusion, no other evidence of facial or head trauma    Nose: Nose normal.     Mouth/Throat:     Mouth: Mucous membranes are moist.  Eyes:     General: No scleral icterus.    Extraocular Movements: Extraocular movements intact.     Conjunctiva/sclera: Conjunctivae normal.     Pupils: Pupils are equal, round, and reactive to light.  Neck:     Comments: No midline tenderness Cardiovascular:     Rate and Rhythm: Normal rate and regular rhythm.     Heart sounds: Normal heart sounds.  Pulmonary:     Effort: Pulmonary effort is normal. No respiratory distress.     Breath sounds: Normal breath sounds.  Chest:     Comments: No chest wall tenderness, bruising, crepitus or deformities Abdominal:     Palpations: Abdomen is soft.     Tenderness: There is no abdominal tenderness.      Comments: No abdominal bruising, abdomen soft and nontender, pelvis stable  Musculoskeletal:     Comments: Bilateral wrist deformities, significant swelling, pulses are audible with Doppler.  Cap refill somewhat diminished likely due to significant swelling, pain with movement of the fingers, normal bilateral shoulder and elbow exams, Lower extremity with normal strength, movement, neurovascularly intact, no  evidence of injury.  No midline spinal tenderness, bruising, swelling or other signs of injury  Skin:    General: Skin is warm and dry.  Neurological:     Mental Status: He is alert.  Psychiatric:        Behavior: Behavior normal.    ED Results / Procedures / Treatments   Labs (all labs ordered are listed, but only abnormal results are displayed) Labs Reviewed  COMPREHENSIVE METABOLIC PANEL - Abnormal; Notable for the following components:      Result Value   Creatinine, Ser 1.31 (*)    All other components within normal limits  CBC - Abnormal; Notable for the following components:   WBC 15.4 (*)    All other components within normal limits  RESP PANEL BY RT-PCR (FLU A&B, COVID) [id number]  URINALYSIS, ROUTINE W REFLEX MICROSCOPIC    EKG None  Radiology DG Wrist Complete Left  Result Date: 12/25/2020 CLINICAL DATA:  Pt reports he fell approx 6 ft from ladder onto concrete; c/o bil wrist pain and unable to move them EXAM: LEFT WRIST - COMPLETE 3+ VIEW COMPARISON:  None. FINDINGS: There is a mildly displaced and angulated comminuted fracture of the distal radius. There is a mildly displaced ulnar styloid process fracture. There is concern for pisiform displacement on lateral view in the palmar direction. Regional soft tissues are unremarkable. IMPRESSION: 1. Mildly displaced and angulated comminuted fracture of the distal radius. 2. Mildly displaced ulnar styloid process fracture. 3. Suspect dislocation of the pisiform. Electronically Signed   By: Audie Pinto M.D.   On:  12/25/2020 13:10   DG Wrist Complete Right  Result Date: 12/25/2020 CLINICAL DATA:  Pt reports he fell approx 6 ft from ladder onto concrete; c/o bil wrist pain and unable to move them EXAM: RIGHT WRIST - COMPLETE 3+ VIEW COMPARISON:  None. FINDINGS: There is a transverse fracture of the distal radius with mild displacement and angulation. There is a tiny fragment along the dorsal triquetrum breast seen on lateral view which is concerning for fracture, age indeterminate. The remaining carpal bones and distal ulna appear intact. There is regional soft tissue swelling. Plate and screw fixation of the fifth metatarsal partially visualized. IMPRESSION: 1. Transverse fracture of the distal radius with mild displacement and angulation. 2. Tiny fragment along the dorsal triquetrum seen on lateral view which is concerning for fracture, age indeterminate. Electronically Signed   By: Audie Pinto M.D.   On: 12/25/2020 13:06   CT Head Wo Contrast  Result Date: 12/25/2020 CLINICAL DATA:  Fall from ladder. EXAM: CT HEAD WITHOUT CONTRAST CT CERVICAL SPINE WITHOUT CONTRAST TECHNIQUE: Multidetector CT imaging of the head and cervical spine was performed following the standard protocol without intravenous contrast. Multiplanar CT image reconstructions of the cervical spine were also generated. COMPARISON:  None. FINDINGS: CT HEAD FINDINGS Brain: No evidence of acute infarction, hemorrhage, hydrocephalus, extra-axial collection or mass lesion/mass effect. Vascular: No hyperdense vessel or unexpected calcification. Skull: Normal. Negative for fracture or focal lesion. Sinuses/Orbits: No acute finding. Other: None. CT CERVICAL SPINE FINDINGS Alignment: Normal. Skull base and vertebrae: No acute fracture. No primary bone lesion or focal pathologic process. Soft tissues and spinal canal: No prevertebral fluid or swelling. No visible canal hematoma. Disc levels: Degenerative disc disease with multilevel anterior  osteophytes. Upper chest: Negative. Other: No other abnormalities. IMPRESSION: 1. No acute intracranial abnormalities. 2. No fracture or traumatic malalignment in the cervical spine. Electronically Signed   By: Dorise Bullion III M.D.  On: 12/25/2020 13:53   CT Cervical Spine Wo Contrast  Result Date: 12/25/2020 CLINICAL DATA:  Fall from ladder. EXAM: CT HEAD WITHOUT CONTRAST CT CERVICAL SPINE WITHOUT CONTRAST TECHNIQUE: Multidetector CT imaging of the head and cervical spine was performed following the standard protocol without intravenous contrast. Multiplanar CT image reconstructions of the cervical spine were also generated. COMPARISON:  None. FINDINGS: CT HEAD FINDINGS Brain: No evidence of acute infarction, hemorrhage, hydrocephalus, extra-axial collection or mass lesion/mass effect. Vascular: No hyperdense vessel or unexpected calcification. Skull: Normal. Negative for fracture or focal lesion. Sinuses/Orbits: No acute finding. Other: None. CT CERVICAL SPINE FINDINGS Alignment: Normal. Skull base and vertebrae: No acute fracture. No primary bone lesion or focal pathologic process. Soft tissues and spinal canal: No prevertebral fluid or swelling. No visible canal hematoma. Disc levels: Degenerative disc disease with multilevel anterior osteophytes. Upper chest: Negative. Other: No other abnormalities. IMPRESSION: 1. No acute intracranial abnormalities. 2. No fracture or traumatic malalignment in the cervical spine. Electronically Signed   By: Dorise Bullion III M.D.   On: 12/25/2020 13:53    Procedures Reduction of fracture  Date/Time: 12/25/2020 7:33 PM Performed by: Margarita Mail, PA-C Authorized by: Margarita Mail, PA-C  Consent: Verbal consent obtained. Risks and benefits: risks, benefits and alternatives were discussed Consent given by: patient Patient identity confirmed: verbally with patient Time out: Immediately prior to procedure a "time out" was called to verify the correct  patient, procedure, equipment, support staff and site/side marked as required. Preparation: Patient was prepped and draped in the usual sterile fashion. Local anesthesia used: yes Anesthesia: hematoma block  Anesthesia: Local anesthesia used: yes Local Anesthetic: lidocaine 1% without epinephrine Anesthetic total: 5 mL  Sedation: Patient sedated: no  Patient tolerance: patient tolerated the procedure well with no immediate complications Comments: Reduction of right wrist fracture   .Splint Application  Date/Time: 12/25/2020 7:34 PM Performed by: Margarita Mail, PA-C Authorized by: Margarita Mail, PA-C   Consent:    Consent obtained:  Verbal   Consent given by:  Patient   Risks discussed:  Discoloration, numbness, pain and swelling   Alternatives discussed:  No treatment Universal protocol:    Patient identity confirmed:  Verbally with patient Pre-procedure details:    Distal neurologic exam:  Normal   Distal perfusion: delayed capillary refill   Procedure details:    Location:  Arm   Arm location:  R lower arm   Splint type:  Sugar tong   Supplies:  Cotton padding, fiberglass and elastic bandage   Attestation: Splint applied and adjusted personally by me   Post-procedure details:    Distal neurologic exam:  Unchanged   Distal perfusion: unchanged     Procedure completion:  Tolerated well, no immediate complications   Post-procedure imaging: reviewed   Reduction of fracture  Date/Time: 12/25/2020 7:36 PM Performed by: Margarita Mail, PA-C Authorized by: Margarita Mail, PA-C  Consent: Verbal consent obtained. Risks and benefits: risks, benefits and alternatives were discussed Consent given by: patient Patient identity confirmed: verbally with patient Time out: Immediately prior to procedure a "time out" was called to verify the correct patient, procedure, equipment, support staff and site/side marked as required. Preparation: Patient was prepped and draped in  the usual sterile fashion. Local anesthesia used: yes Anesthesia: hematoma block  Anesthesia: Local anesthesia used: yes Local Anesthetic: lidocaine 1% without epinephrine Anesthetic total: 5 mL  Sedation: Patient sedated: no  Patient tolerance: patient tolerated the procedure well with no immediate complications Comments: Left wrist fracture reduction   .  Splint Application  Date/Time: 12/25/2020 7:39 PM Performed by: Margarita Mail, PA-C Authorized by: Margarita Mail, PA-C   Consent:    Consent obtained:  Verbal   Consent given by:  Patient   Risks, benefits, and alternatives were discussed: yes     Risks discussed:  Discoloration, numbness, pain and swelling   Alternatives discussed:  No treatment Universal protocol:    Patient identity confirmed:  Verbally with patient Pre-procedure details:    Distal neurologic exam:  Normal   Distal perfusion: delayed capillary refill   Procedure details:    Location:  Arm   Arm location:  L lower arm   Cast type:  Short arm   Splint type:  Sugar tong   Supplies:  Fiberglass and elastic bandage Post-procedure details:    Distal neurologic exam:  Unchanged   Distal perfusion: unchanged     Procedure completion:  Tolerated well, no immediate complications   Post-procedure imaging: reviewed     Medications Ordered in ED Medications  morphine 4 MG/ML injection 4 mg (4 mg Intravenous Given 12/25/20 1201)  ondansetron (ZOFRAN) injection 4 mg (4 mg Intravenous Given 12/25/20 1200)  HYDROmorphone (DILAUDID) injection 0.5 mg (0.5 mg Intravenous Given 12/25/20 1259)    ED Course  I have reviewed the triage vital signs and the nursing notes.  Pertinent labs & imaging results that were available during my care of the patient were reviewed by me and considered in my medical decision making (see chart for details).    MDM Rules/Calculators/A&P Here with fall from 6 foot height, obvious bilateral wrist fractures on clinical exam.   Patient seen and shared visit with Dr. Billy Fischer who ordered CT head and C-spine.  I have reviewed patient's imaging which includes bilateral wrist x-rays which shows distal radial and ulnar fractures and a left Pisiform dislocation.  CT head and C-spine are without abnormality.  I have reviewed the patient's labs which shows mildly elevated white blood cell count of 15.4, respiratory panel negative for COVID or influenza, CMP with slightly elevated creatinine consistent with history of CKD, UA without obvious and affection.  I reviewed the images and discussed the case with Dr. Greta Doom of Ortho hand surgery.  He asked that I attempt block and reduction along with sugar-tong splints bilaterally.  He will see the patient in the office on Tuesday.  Patient's postreduction films show improved alignment of the wrists.  Patient pain addressed here in the emergency department.  Will discharge with pain medications, PDMP reviewed by me personally.  Patient's capillary refill diminished but this appears to be secondary to swelling.  He has palpable pulses.  I think this will resolve as his swelling decreases.  Will be discharged to follow-up this coming Tuesday with Dr. Greta Doom.  Discussed outpatient follow-up and return precautions.  He has good home care.  Appears appropriate for discharge at this time Final Clinical Impression(s) / ED Diagnoses Final diagnoses:  None    Rx / DC Orders ED Discharge Orders     None        Margarita Mail, PA-C 12/25/20 1943    Gareth Morgan, MD 12/26/20 [phone]

## 2020-12-25 NOTE — ED Triage Notes (Signed)
Pt reports he fell approx 6 ft from ladder onto concrete; c/o bil wrist pain and unable to move them; reports he hit lip and head; slight abrasion noted to lip; denies LOC; does not take blood thinners

## 2020-12-25 NOTE — Discharge Instructions (Addendum)
Contact a health care provider if: Your cast, splint, or sling is damaged or loose. You have any new pain, swelling, or bruising. Your pain, swelling, and bruising do not improve. You have a fever. You have chills. Get help right away if: Your skin or fingers on your injured arm turn blue or gray. Your arm feels cold or numb. You have severe pain in your injured wrist.

## 2020-12-25 NOTE — ED Notes (Signed)
XR at beside.

## 2020-12-27 ENCOUNTER — Telehealth: Payer: Self-pay

## 2020-12-27 NOTE — Telephone Encounter (Signed)
Transition Care Management Follow-up Telephone Call Date of discharge and from where: 12/25/2020 from [facility] How have you been since you were released from the hospital? Pt stated that he is feeling well and did not have any questions or concerns at this time.  Any questions or concerns? No  Items Reviewed: Did the pt receive and understand the discharge instructions provided? Yes  Medications obtained and verified? Yes  Other? No  Any new allergies since your discharge? No  Dietary orders reviewed? No Do you have support at home? Yes   Functional Questionnaire: (I = Independent and D = Dependent) ADLs: I  Bathing/Dressing- I  Meal Prep- I  Eating- I  Maintaining continence- I  Transferring/Ambulation- I  Managing Meds- I   Follow up appointments reviewed:  PCP Hospital f/u appt confirmed? No   Specialist Hospital f/u appt confirmed? Yes  Scheduled to see Surgeon on 12/27/20. Are transportation arrangements needed? No  If their condition worsens, is the pt aware to call PCP or go to the Emergency Dept.? Yes Was the patient provided with contact information for the PCP's office or ED? Yes Was to pt encouraged to call back with questions or concerns? Yes

## 2021-01-21 ENCOUNTER — Encounter: Payer: Self-pay | Admitting: Family

## 2021-02-08 DIAGNOSIS — M25439 Effusion, unspecified wrist: Secondary | ICD-10-CM | POA: Diagnosis not present

## 2021-02-08 DIAGNOSIS — M25539 Pain in unspecified wrist: Secondary | ICD-10-CM | POA: Diagnosis not present

## 2021-02-08 DIAGNOSIS — Z789 Other specified health status: Secondary | ICD-10-CM | POA: Diagnosis not present

## 2021-02-08 DIAGNOSIS — M25631 Stiffness of right wrist, not elsewhere classified: Secondary | ICD-10-CM | POA: Diagnosis not present

## 2021-02-08 DIAGNOSIS — M25632 Stiffness of left wrist, not elsewhere classified: Secondary | ICD-10-CM | POA: Diagnosis not present

## 2021-02-24 DIAGNOSIS — M25632 Stiffness of left wrist, not elsewhere classified: Secondary | ICD-10-CM | POA: Diagnosis not present

## 2021-02-24 DIAGNOSIS — S52572A Other intraarticular fracture of lower end of left radius, initial encounter for closed fracture: Secondary | ICD-10-CM | POA: Diagnosis not present

## 2021-02-24 DIAGNOSIS — M25539 Pain in unspecified wrist: Secondary | ICD-10-CM | POA: Diagnosis not present

## 2021-02-24 DIAGNOSIS — S52571A Other intraarticular fracture of lower end of right radius, initial encounter for closed fracture: Secondary | ICD-10-CM | POA: Diagnosis not present

## 2021-02-24 DIAGNOSIS — M25631 Stiffness of right wrist, not elsewhere classified: Secondary | ICD-10-CM | POA: Diagnosis not present

## 2021-02-24 DIAGNOSIS — M25439 Effusion, unspecified wrist: Secondary | ICD-10-CM | POA: Diagnosis not present

## 2021-02-24 DIAGNOSIS — Z789 Other specified health status: Secondary | ICD-10-CM | POA: Diagnosis not present

## 2021-03-03 DIAGNOSIS — Z789 Other specified health status: Secondary | ICD-10-CM | POA: Diagnosis not present

## 2021-03-03 DIAGNOSIS — M25631 Stiffness of right wrist, not elsewhere classified: Secondary | ICD-10-CM | POA: Diagnosis not present

## 2021-03-03 DIAGNOSIS — M25539 Pain in unspecified wrist: Secondary | ICD-10-CM | POA: Diagnosis not present

## 2021-03-03 DIAGNOSIS — M25632 Stiffness of left wrist, not elsewhere classified: Secondary | ICD-10-CM | POA: Diagnosis not present

## 2021-03-03 DIAGNOSIS — M25439 Effusion, unspecified wrist: Secondary | ICD-10-CM | POA: Diagnosis not present

## 2021-03-09 DIAGNOSIS — M25539 Pain in unspecified wrist: Secondary | ICD-10-CM | POA: Diagnosis not present

## 2021-03-09 DIAGNOSIS — M25632 Stiffness of left wrist, not elsewhere classified: Secondary | ICD-10-CM | POA: Diagnosis not present

## 2021-03-09 DIAGNOSIS — M25439 Effusion, unspecified wrist: Secondary | ICD-10-CM | POA: Diagnosis not present

## 2021-03-09 DIAGNOSIS — Z789 Other specified health status: Secondary | ICD-10-CM | POA: Diagnosis not present

## 2021-03-09 DIAGNOSIS — M25631 Stiffness of right wrist, not elsewhere classified: Secondary | ICD-10-CM | POA: Diagnosis not present

## 2021-03-15 NOTE — Progress Notes (Addendum)
[place] [facility] [place], [place], [place] [address] [phone]  Date:  03/17/2021   Name:  Erik Weber   DOB:  07/01/1975   MRN:  [id number]  PCP:  Darreld Mclean, MD    Chief Complaint: Follow-up (Concerns/ questions: elevated BP since broken wrist in November.)   History of Present Illness:  Erik Weber is a 46 y.o. very pleasant male patient who presents with the following:  Patient seen today for follow-up Most recent visit with myself was for physical exam last July, then a sore throat last August History of chronic kidney disease, sleep apnea, hypertension, gastric bypass in September 2020, SS trait  Married to [place] who is also my patient  Since our last visit Akin suffered bilateral wrist fractures, has been seeing Dr. Burney Gauze and doing occupational therapy He fell on November 19 -he was on a 6 foot ladder which was unsteady, he fell to the ground and broke both wrists - was seen in the ER at [facility] and was treated surgically 11/23, did well afterwards  He is finished with hand therapy as of yesterday He was out of work for only a month  He can do all his normal activities and does not have much pain at this point   He was also seen by general surgery, Dr. [name] for follow-up of gastric bypass earlier this month He underwent gastric bypass in September 2020 Dr. [name] noted a 75 pound weight loss, continued to do well  Nephrology-[facility]-Dr. Harriet Masson Electrophysiology-Dr. Curt Bears COVID-19 booster CMP, CBC on chart from November Lipids were checked in July  CBC in November showed leukocytosis, recheck today Patient Active Problem List   Diagnosis Date Noted   Sickle cell trait ([place]) 05/12/2020   Chronic renal insufficiency    Fracture of base of fifth metacarpal bone of right hand    GERD (gastroesophageal reflux disease)    Hypertension    Morbid  obesity ([place])    Positive TB test    Sleep apnea    Iron deficiency anemia secondary to inadequate dietary iron intake 12/04/2019   CKD (chronic kidney disease) stage 3, GFR 30-59 ml/min ([place]) 11/25/2018   OSA (obstructive sleep apnea) 10/28/2018   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  Secondary to hypertensive nephropathy    Disorder resulting from impaired renal function 05/01/2007   Thought due to hypertension, followed by Dr. Florene Glen     Essential hypertension 09/21/2009   Qualifier: Diagnosis of  By: Wynona Luna    Fracture of base of fifth metacarpal bone of right hand    GERD 04/04/2007   Qualifier: Diagnosis of  By: Wynona Luna    GERD (gastroesophageal reflux disease)    hx of    Hypertension    Hypertriglyceridemia 05/28/2012   Hypogonadism male 06/07/2011   Iron deficiency anemia secondary to inadequate dietary iron intake 12/04/2019   Morbid obesity ([place])    Obesity 08/18/2013   OSA (  obstructive sleep apnea) 10/28/2018   Pneumonitis 10/04/2018   after accidental inhalation of swimming pool water during swimmng lesson   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   Sleep apnea    Mild, no cpap needed   Tinea versicolor 05/19/2013    Past Surgical History:  Procedure Laterality Date   ADENOIDECTOMY     childhood   GASTRIC ROUX-EN-Y N/A 10/28/2018   Procedure: LAPAROSCOPIC ROUX-EN-Y GASTRIC BYPASS WITH UPPER ENDOSCOPY;  Surgeon: Greer Pickerel, MD;  Location: [facility];  Service: General;  Laterality: N/A;    HAND SURGERY Right 02/2016   UPPER GI ENDOSCOPY  03/10/2016   XI ROBOTIC ASSISTED INGUINAL HERNIA REPAIR WITH MESH Left 10/10/2019   Procedure: XI ROBOTIC ASSISTED LEFT INGUINAL HERNIA REPAIR WITH MESH;  Surgeon: Greer Pickerel, MD;  Location: [facility];  Service: General;  Laterality: Left;    Social History   Tobacco Use   Smoking status: Never   Smokeless tobacco: Never  Vaping Use   Vaping Use: Never used  Substance Use Topics   Alcohol use: No    Alcohol/week: 0.0 standard drinks   Drug use: No    Family History  Problem Relation Age of Onset   Kidney cancer Mother 10   Hypertension Father        age 10   Diabetes Father    Lung cancer Paternal Uncle    Colon cancer Neg Hx    Prostate cancer Neg Hx    Liver disease Neg Hx    Esophageal cancer Neg Hx    Stomach cancer Neg Hx    Pancreatic cancer Neg Hx     No Known Allergies  Medication list has been reviewed and updated.  Current Outpatient Medications on File Prior to Visit  Medication Sig Dispense Refill   Ferrous Sulfate (IRON PO) Take by mouth.     flecainide (TAMBOCOR) 50 MG tablet Take 1 tablet (50 mg total) by mouth daily. 90 tablet 1   Multiple Vitamins-Minerals (MULTIVITAMIN WITH MINERALS) tablet Take 1 tablet by mouth daily.     No current facility-administered medications on file prior to visit.    Review of Systems:  As per HPI- otherwise negative.   Physical Examination: Vitals:   03/17/21 0906  BP: 120/80  Pulse: (!) 57  Resp: 18  Temp: 98 F (36.7 C)  SpO2: 98%   Vitals:   03/17/21 0906  Weight: 205 lb 12.8 oz (93.4 kg)  Height: 6' (1.829 m)   Body mass index is 27.91 kg/m. Ideal Body Weight: Weight in (lb) to have BMI = 25: 183.9  GEN: no acute distress.  Minimal overweight, looks well HEENT: Atraumatic, Normocephalic. Bilateral TM wnl, oropharynx normal.  PEERL,EOMI.   Ears and Nose: No external deformity. CV: RRR, No M/G/R. No JVD. No thrill. No extra heart sounds. PULM:  CTA B, no wheezes, crackles, rhonchi. No retractions. No resp. distress. No accessory muscle use. ABD: S, NT, ND, +BS. No rebound. No HSM. EXTR: No c/c/e PSYCH: Normally interactive. Conversant.  Well-healed scars from bilateral wrist operative fixation  Assessment and Plan: Essential hypertension - Plan: CBC, Basic metabolic panel  Stage 3 chronic kidney disease, unspecified whether stage 3a or 3b CKD ([place]) - Plan: Basic metabolic panel  S/P gastric bypass - Plan: VITAMIN D 25 Hydroxy (Vit-D Deficiency, Fractures), Vitamin B12, Ferritin  Iron deficiency  Leukocytosis, unspecified type - Plan: CBC  Screening for prostate cancer - Plan: PSA  Screening for diabetes mellitus - Plan: Hemoglobin A1c  Patient is following up today.  Blood pressure under good control Follow-up on renal function and routine lab work as above today Patient is status post gastric bypass, will check vitamin D, B12, ferritin  Thankfully he has recovered well from bilateral wrist fractures  Assuming all is well plan to recheck in 6 months  Signed Lamar Blinks, MD  Addendum, received labs as below.  Message to patient  Results for orders placed or performed in visit on 03/17/21  CBC  Result Value Ref Range   WBC 6.7 4.0 - 10.5 K/uL   RBC 5.27 4.22 - 5.81 Mil/uL   Platelets 254.0 150.0 - 400.0 K/uL   Hemoglobin 14.9 13.0 - 17.0 g/dL   HCT 46.2 39.0 - 52.0 %   MCV 87.6 78.0 - 100.0 fl   MCHC 32.2 30.0 - 36.0 g/dL   RDW 12.8 11.5 - 15.5 %  PSA  Result Value Ref Range   PSA 0.29 0.10 - 4.00 ng/mL  Hemoglobin A1c  Result Value Ref Range   Hgb A1c MFr Bld 4.8 4.6 - 6.5 %  VITAMIN D 25 Hydroxy (Vit-D Deficiency, Fractures)  Result Value Ref Range   VITD 40.73 30.00 - 100.00 ng/mL  Vitamin B12  Result Value Ref Range   Vitamin B-12 408 211 - 911 pg/mL  Ferritin  Result Value Ref Range   Ferritin 37.7 22.0 - 322.0 ng/mL  Basic metabolic panel  Result Value Ref Range   Sodium 141 135 - 145  mEq/L   Potassium 4.6 3.5 - 5.1 mEq/L   Chloride 103 96 - 112 mEq/L   CO2 33 (H) 19 - 32 mEq/L   Glucose, Bld 78 70 - 99 mg/dL   BUN 10 6 - 23 mg/dL   Creatinine, Ser 1.23 0.40 - 1.50 mg/dL   GFR 70.44 >60.00 mL/min   Calcium 9.6 8.4 - 10.5 mg/dL

## 2021-03-15 NOTE — Patient Instructions (Addendum)
It was good to see you again today, I will be in touch with your labs.  Otherwise please see me in about 6 months I am so glad you have healed so well!

## 2021-03-16 DIAGNOSIS — M25539 Pain in unspecified wrist: Secondary | ICD-10-CM | POA: Diagnosis not present

## 2021-03-16 DIAGNOSIS — M25439 Effusion, unspecified wrist: Secondary | ICD-10-CM | POA: Diagnosis not present

## 2021-03-16 DIAGNOSIS — M25632 Stiffness of left wrist, not elsewhere classified: Secondary | ICD-10-CM | POA: Diagnosis not present

## 2021-03-16 DIAGNOSIS — Z789 Other specified health status: Secondary | ICD-10-CM | POA: Diagnosis not present

## 2021-03-16 DIAGNOSIS — M25631 Stiffness of right wrist, not elsewhere classified: Secondary | ICD-10-CM | POA: Diagnosis not present

## 2021-03-17 ENCOUNTER — Encounter: Payer: Self-pay | Admitting: Family

## 2021-03-17 ENCOUNTER — Ambulatory Visit (INDEPENDENT_AMBULATORY_CARE_PROVIDER_SITE_OTHER): Payer: BC Managed Care – PPO | Admitting: Family Medicine

## 2021-03-17 ENCOUNTER — Encounter: Payer: Self-pay | Admitting: Family Medicine

## 2021-03-17 ENCOUNTER — Ambulatory Visit: Payer: Medicaid Other | Admitting: Family Medicine

## 2021-03-17 VITALS — BP 120/80 | HR 57 | Temp 98.0°F | Resp 18 | Ht 72.0 in | Wt 205.8 lb

## 2021-03-17 DIAGNOSIS — N183 Chronic kidney disease, stage 3 unspecified: Secondary | ICD-10-CM

## 2021-03-17 DIAGNOSIS — Z125 Encounter for screening for malignant neoplasm of prostate: Secondary | ICD-10-CM | POA: Diagnosis not present

## 2021-03-17 DIAGNOSIS — Z131 Encounter for screening for diabetes mellitus: Secondary | ICD-10-CM

## 2021-03-17 DIAGNOSIS — D72829 Elevated white blood cell count, unspecified: Secondary | ICD-10-CM | POA: Diagnosis not present

## 2021-03-17 DIAGNOSIS — I10 Essential (primary) hypertension: Secondary | ICD-10-CM

## 2021-03-17 DIAGNOSIS — E611 Iron deficiency: Secondary | ICD-10-CM

## 2021-03-17 DIAGNOSIS — Z9884 Bariatric surgery status: Secondary | ICD-10-CM | POA: Diagnosis not present

## 2021-03-17 LAB — BASIC METABOLIC PANEL
BUN: 10 mg/dL (ref 6–23)
CO2: 33 mEq/L — ABNORMAL HIGH (ref 19–32)
Calcium: 9.6 mg/dL (ref 8.4–10.5)
Chloride: 103 mEq/L (ref 96–112)
Creatinine, Ser: 1.23 mg/dL (ref 0.40–1.50)
GFR: 70.44 mL/min (ref >60.00)
Glucose, Bld: 78 mg/dL (ref 70–99)
Potassium: 4.6 mEq/L (ref 3.5–5.1)
Sodium: 141 mEq/L (ref 135–145)

## 2021-03-17 LAB — CBC
HCT: 46.2 % (ref 39.0–52.0)
Hemoglobin: 14.9 g/dL (ref 13.0–17.0)
MCHC: 32.2 g/dL (ref 30.0–36.0)
MCV: 87.6 fl (ref 78.0–100.0)
Platelets: 254 10*3/uL (ref 150.0–400.0)
RBC: 5.27 Mil/uL (ref 4.22–5.81)
RDW: 12.8 % (ref 11.5–15.5)
WBC: 6.7 10*3/uL (ref 4.0–10.5)

## 2021-03-17 LAB — FERRITIN: Ferritin: 37.7 ng/mL (ref 22.0–322.0)

## 2021-03-17 LAB — VITAMIN B12: Vitamin B-12: 408 pg/mL (ref 211–911)

## 2021-03-17 LAB — VITAMIN D 25 HYDROXY (VIT D DEFICIENCY, FRACTURES): VITD: 40.73 ng/mL (ref 30.00–100.00)

## 2021-03-17 LAB — HEMOGLOBIN A1C: Hgb A1c MFr Bld: 4.8 % (ref 4.6–6.5)

## 2021-03-17 LAB — PSA: PSA: 0.29 ng/mL (ref 0.10–4.00)

## 2021-03-24 ENCOUNTER — Encounter: Payer: Self-pay | Admitting: Family Medicine

## 2021-03-24 DIAGNOSIS — S52571A Other intraarticular fracture of lower end of right radius, initial encounter for closed fracture: Secondary | ICD-10-CM | POA: Diagnosis not present

## 2021-03-24 DIAGNOSIS — S52572A Other intraarticular fracture of lower end of left radius, initial encounter for closed fracture: Secondary | ICD-10-CM | POA: Diagnosis not present

## 2021-03-24 NOTE — Progress Notes (Signed)
PCP:  Darreld Mclean, MD Primary Cardiologist: Berniece Salines, DO Electrophysiologist: Will Meredith Leeds, MD   Erik Weber is a 46 y.o. male seen today for Will Meredith Leeds, MD for routine electrophysiology followup.  Since last being seen in our clinic the patient reports doing very well.  he denies chest pain, palpitations, dyspnea, PND, orthopnea, nausea, vomiting, dizziness, syncope, edema, weight gain, or early satiety.  Past Medical History:  Diagnosis Date   Chronic renal insufficiency    CKD (chronic kidney disease) stage 3, GFR 30-59 ml/min ([facility]) 11/25/2018   Managed by [facility] Dr Carolin Sicks.  Secondary to hypertensive nephropathy    Disorder resulting from impaired renal function 05/01/2007   Thought due to hypertension, followed by Dr. Florene Glen     Essential hypertension 09/21/2009   Qualifier: Diagnosis of  By: Wynona Luna    Fracture of base of fifth metacarpal bone of right hand    GERD 04/04/2007   Qualifier: Diagnosis of  By: Wynona Luna    GERD (gastroesophageal reflux disease)    hx of    Hypertension    Hypertriglyceridemia 05/28/2012   Hypogonadism male 06/07/2011   Iron deficiency anemia secondary to inadequate dietary iron intake 12/04/2019   Morbid obesity ([place])    Obesity 08/18/2013   OSA (obstructive sleep apnea) 10/28/2018   Pneumonitis 10/04/2018   after accidental inhalation of swimming pool water during swimmng lesson   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   Sleep apnea    Mild, no cpap needed   Tinea versicolor 05/19/2013   Past Surgical History:  Procedure Laterality Date   ADENOIDECTOMY     childhood   GASTRIC ROUX-EN-Y N/A 10/28/2018   Procedure: LAPAROSCOPIC ROUX-EN-Y GASTRIC BYPASS WITH UPPER ENDOSCOPY;  Surgeon: Greer Pickerel, MD;  [facility];  Service: General;  Laterality: N/A;   HAND SURGERY Right 02/2016   UPPER GI ENDOSCOPY  03/10/2016   XI ROBOTIC ASSISTED INGUINAL HERNIA  REPAIR WITH MESH Left 10/10/2019   Procedure: XI ROBOTIC ASSISTED LEFT INGUINAL HERNIA REPAIR WITH MESH;  Surgeon: Greer Pickerel, MD;  [facility];  Service: General;  Laterality: Left;    Current Outpatient Medications  Medication Sig Dispense Refill   Ferrous Sulfate (IRON PO) Take by mouth.     flecainide (TAMBOCOR) 50 MG tablet Take 1 tablet (50 mg total) by mouth daily. 90 tablet 1   Multiple Vitamins-Minerals (MULTIVITAMIN WITH MINERALS) tablet Take 1 tablet by mouth daily.     No current facility-administered medications for this visit.    No Known Allergies  Social History   Socioeconomic History   Marital status: Married    Spouse name: Not on file   Number of children: 1   Years of education: 16   Highest education level: Not on file  Occupational History   Occupation: [employer] manager  Tobacco Use   Smoking status: Never   Smokeless tobacco: Never  Vaping Use   Vaping Use: Never used  Substance and Sexual Activity   Alcohol use: No    Alcohol/week: 0.0 standard drinks   Drug use: No   Sexual activity: Yes    Partners: Male  Other Topics Concern   Not on file  Social History Narrative   Occupation: Works for lab   Single    Adopted daughter - 17   Moved from [place] 4 yrs ago.   Never Smoked  Alcohol use-no   Caffeine use/day:  None   Does Patient Exercise:  yes            Social Determinants of Health   Financial Resource Strain: Not on file  Food Insecurity: Not on file  Transportation Needs: Not on file  Physical Activity: Not on file  Stress: Not on file  Social Connections: Not on file  Intimate Partner Violence: Not on file     Review of Systems: All other systems reviewed and are otherwise negative except as noted above.  Physical Exam: Vitals:   03/25/21 0800  BP: 128/84  Pulse: 63  SpO2: 98%  Weight: 207 lb 3.2 oz (94 kg)  Height: 6' (1.829 m)    GEN- The patient is well appearing, alert and oriented x 3 today.    HEENT: normocephalic, atraumatic; sclera clear, conjunctiva pink; hearing intact; oropharynx clear; neck supple, no JVP Lymph- no cervical lymphadenopathy Lungs- Clear to ausculation bilaterally, normal work of breathing.  No wheezes, rales, rhonchi Heart- Regular rate and rhythm, no murmurs, rubs or gallops, PMI not laterally displaced GI- soft, non-tender, non-distended, bowel sounds present, no hepatosplenomegaly Extremities- no clubbing, cyanosis, or edema; DP/PT/radial pulses 2+ bilaterally MS- no significant deformity or atrophy Skin- warm and dry, no rash or lesion Psych- euthymic mood, full affect Neuro- strength and sensation are intact  EKG is ordered. Personal review of EKG from today shows NSR at 63 bpm,   Additional studies reviewed include: Previous EP office notes.   Assessment and Plan:  1.  PVCs:  EKG today shows NSR on flecainide.  High risk medication monitoring.   EF normal.  He is taking flecainide 50 mg daily. His PR interval varies between 190-230 looks back as far as 04/2020.   2.  Mobitz 1 AV block:  Diltiazem stopped due to Mobitz 1 AV block.   If he does have further atrial arrhythmias, may need to restart this. Would need to follow very closely.  Follow up with Dr. Curt Bears in 6 months   Shirley Friar, [place]  03/25/21 8:17 AM

## 2021-03-25 ENCOUNTER — Encounter: Payer: Self-pay | Admitting: Student

## 2021-03-25 ENCOUNTER — Other Ambulatory Visit: Payer: Self-pay

## 2021-03-25 ENCOUNTER — Encounter: Payer: Self-pay | Admitting: Family

## 2021-03-25 ENCOUNTER — Ambulatory Visit: Payer: BC Managed Care – PPO | Admitting: Student

## 2021-03-25 VITALS — BP 128/84 | HR 63 | Ht 72.0 in | Wt 207.2 lb

## 2021-03-25 DIAGNOSIS — I441 Atrioventricular block, second degree: Secondary | ICD-10-CM | POA: Diagnosis not present

## 2021-03-25 DIAGNOSIS — I493 Ventricular premature depolarization: Secondary | ICD-10-CM

## 2021-03-25 NOTE — Patient Instructions (Signed)
Medication Instructions:  Your physician recommends that you continue on your current medications as directed. Please refer to the Current Medication list given to you today.  *If you need a refill on your cardiac medications before your next appointment, please call your pharmacy*   Lab Work: None If you have labs (blood work) drawn today and your tests are completely normal, you will receive your results only by: MyChart Message (if you have MyChart) OR A paper copy in the mail If you have any lab test that is abnormal or we need to change your treatment, we will call you to review the results.   Follow-Up: At [facility], you and your health needs are our priority.  As part of our continuing mission to provide you with exceptional heart care, we have created designated Provider Care Teams.  These Care Teams include your primary Cardiologist (physician) and Advanced Practice Providers (APPs -  Physician Assistants and Nurse Practitioners) who all work together to provide you with the care you need, when you need it.   Your next appointment:   6 month(s)  The format for your next appointment:   In Person  Provider:   You may see Will Martin Camnitz, MD or one of the following Advanced Practice Providers on your designated Care Team:   Renee Ursuy, PA-C Michael "Andy" Tillery, PA-C  

## 2021-04-26 ENCOUNTER — Encounter: Payer: Self-pay | Admitting: Family

## 2021-05-03 ENCOUNTER — Encounter: Payer: Self-pay | Admitting: Family

## 2021-05-11 ENCOUNTER — Encounter: Payer: Self-pay | Admitting: Family

## 2021-05-11 ENCOUNTER — Other Ambulatory Visit: Payer: Self-pay | Admitting: Cardiology

## 2021-05-12 ENCOUNTER — Ambulatory Visit (INDEPENDENT_AMBULATORY_CARE_PROVIDER_SITE_OTHER): Payer: BC Managed Care – PPO | Admitting: Internal Medicine

## 2021-05-12 ENCOUNTER — Encounter: Payer: Self-pay | Admitting: Internal Medicine

## 2021-05-12 VITALS — BP 120/80 | HR 65 | Temp 98.6°F | Wt 212.7 lb

## 2021-05-12 DIAGNOSIS — J069 Acute upper respiratory infection, unspecified: Secondary | ICD-10-CM | POA: Diagnosis not present

## 2021-05-12 NOTE — Progress Notes (Signed)
? ? ? ?Acute office Visit ? ? ? ? ?This visit occurred during the SARS-CoV-2 public health emergency.  Safety protocols were in place, including screening questions prior to the visit, additional usage of staff PPE, and extensive cleaning of exam room while observing appropriate contact time as indicated for disinfecting solutions.  ? ? ?CC/Reason for Visit: Cold symptoms ? ?HPI: Erik Weber is a 46 y.o. male who is coming in today for the above mentioned reasons.  For the past 4 days he has been having nasal congestion, sinus pressure, cough and a hoarse voice.  He usually has bad spring allergies.  He has been taking Zyrtec and Mucinex with partial relief. ? ?Past Medical/Surgical History: ?Past Medical History:  ?Diagnosis Date  ? Chronic renal insufficiency   ? CKD (chronic kidney disease) stage 3, GFR 30-59 ml/min ([place]) 11/25/2018  ? Managed by [facility] Dr Carolin Sicks.  Secondary to hypertensive nephropathy   ? Disorder resulting from impaired renal function 05/01/2007  ? Thought due to hypertension, followed by Dr. Florene Glen    ? Essential hypertension 09/21/2009  ? Qualifier: Diagnosis of  By: Wynona Luna   ? Fracture of base of fifth metacarpal bone of right hand   ? GERD 04/04/2007  ? Qualifier: Diagnosis of  By: Wynona Luna   ? GERD (gastroesophageal reflux disease)   ? hx of   ? Hypertension   ? Hypertriglyceridemia 05/28/2012  ? Hypogonadism male 06/07/2011  ? Iron deficiency anemia secondary to inadequate dietary iron intake 12/04/2019  ? Morbid obesity ([place])   ? Obesity 08/18/2013  ? OSA (obstructive sleep apnea) 10/28/2018  ? Pneumonitis 10/04/2018  ? after accidental inhalation of swimming pool water during swimmng lesson  ? Positive TB test   ? over 20 years ago  ? 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  ? Sleep apnea   ? Mild, no cpap needed  ? Tinea versicolor 05/19/2013  ? ? ?Past Surgical History:  ?Procedure Laterality Date  ? ADENOIDECTOMY    ? childhood  ? GASTRIC ROUX-EN-Y N/A 10/28/2018  ?  Procedure: LAPAROSCOPIC ROUX-EN-Y GASTRIC BYPASS WITH UPPER ENDOSCOPY;  Surgeon: Greer Pickerel, MD;  [facility]: [facility];  Service: General;  Laterality: N/A;  ? HAND SURGERY Right 02/2016  ? UPPER GI ENDOSCOPY  03/10/2016  ? XI ROBOTIC ASSISTED INGUINAL HERNIA REPAIR WITH MESH Left 10/10/2019  ? Procedure: XI ROBOTIC ASSISTED LEFT INGUINAL HERNIA REPAIR WITH MESH;  Surgeon: Greer Pickerel, MD;  Location: [facility];  Service: General;  Laterality: Left;  ? ? ?Social History: ? reports that he has never smoked. He has never used smokeless tobacco. He reports that he does not drink alcohol and does not use drugs. ? ?Allergies: ?No Known Allergies ? ?Family History:  ?Family History  ?Problem Relation Age of Onset  ? Kidney cancer Mother 74  ? Hypertension Father   ?     age 56  ? Diabetes Father   ? Lung cancer Paternal Uncle   ? Colon cancer Neg Hx   ? Prostate cancer Neg Hx   ? Liver disease Neg Hx   ? Esophageal cancer Neg Hx   ? Stomach cancer Neg Hx   ? Pancreatic cancer Neg Hx   ? ? ? ?Current Outpatient Medications:  ?  Ferrous Sulfate (IRON PO), Take by mouth., Disp: , Rfl:  ?  flecainide (TAMBOCOR) 50 MG tablet, TAKE 1 TABLET BY MOUTH EVERY DAY, Disp: 30 tablet, Rfl: 5 ?  Multiple Vitamins-Minerals (MULTIVITAMIN  WITH MINERALS) tablet, Take 1 tablet by mouth daily., Disp: , Rfl:  ? ?Review of Systems:  ?Constitutional: Denies fever, chills, diaphoresis, appetite change and fatigue.  ?HEENT: Denies photophobia, eye pain, redness, mouth sores, trouble swallowing, neck pain, neck stiffness and tinnitus.   ?Respiratory: Denies SOB, DOE, cough, chest tightness,  and wheezing.   ?Cardiovascular: Denies chest pain, palpitations and leg swelling.  ?Gastrointestinal: Denies nausea, vomiting, abdominal pain, diarrhea, constipation, blood in stool and abdominal distention.  ?Genitourinary: Denies dysuria, urgency, frequency, hematuria, flank pain and difficulty urinating.  ?Endocrine: Denies: hot or cold intolerance, sweats,  changes in hair or nails, polyuria, polydipsia. ?Musculoskeletal: Denies myalgias, back pain, joint swelling, arthralgias and gait problem.  ?Skin: Denies pallor, rash and wound.  ?Neurological: Denies dizziness, seizures, syncope, weakness, light-headedness, numbness and headaches.  ?Hematological: Denies adenopathy. Easy bruising, personal or family bleeding history  ?Psychiatric/Behavioral: Denies suicidal ideation, mood changes, confusion, nervousness, sleep disturbance and agitation ? ? ? ?Physical Exam: ?Vitals:  ? 05/12/21 1603  ?BP: 120/80  ?Pulse: 65  ?Temp: 98.6 ?F (37 ?C)  ?TempSrc: Oral  ?SpO2: 97%  ?Weight: 212 lb 11.2 oz (96.5 kg)  ? ? ?Body mass index is 28.85 kg/m?. ? ? ?Constitutional: NAD, calm, comfortable ?Eyes: PERRL, lids and conjunctivae normal ?ENMT: Mucous membranes are moist. Posterior pharynx is slightly erythematous but clear of any exudate or lesions. Normal dentition. Tympanic membrane is pearly white, no erythema or bulging. ?Neck: normal, supple, no masses, no thyromegaly ?Respiratory: clear to auscultation bilaterally, no wheezing, no crackles. Normal respiratory effort. No accessory muscle use.  ?Cardiovascular: Regular rate and rhythm, no murmurs / rubs / gallops. No extremity edema.  ?Psychiatric: Normal judgment and insight. Alert and oriented x 3. Normal mood.  ? ? ?Impression and Plan: ? ?Viral upper respiratory tract infection ? ?-Given exam findings, PNA, pharyngitis, ear infection are not likely, hence abx have not been prescribed. ?-Have advised rest, fluids, OTC antihistamines, cough suppressants and mucinex. ?-RTC if no improvement in 10-14 days. ? ? ? ?Time spent:20 minutes reviewing chart, interviewing and examining patient and formulating plan of care. ? ? ? ? ? ?Lelon Frohlich, MD ?[facility] ? ? ?

## 2021-05-19 ENCOUNTER — Encounter: Payer: Self-pay | Admitting: Family Medicine

## 2021-06-04 NOTE — Progress Notes (Signed)
Therapist, music at [facility] ?[address], [address] ?[place], [place] [address] ?336 [phone] ?Fax [phone] ? ?Date:  06/08/2021  ? ?Name:  Erik Weber   DOB:  Apr 12, 1975   MRN:  [id number] ? ?PCP:  Darreld Mclean, MD  ? ? ?Chief Complaint: Weight Loss (Pt says he has not had and viral sxs, just stressed and would like labs. /Concerns/ questions: /) ? ? ?History of Present Illness: ? ?Erik Weber is a 46 y.o. very pleasant male patient who presents with the following: ? ?Pt seen today with concern of acute distress, STI screening ?Last seen by myself in February at which time her was recovering from bilateral wrist fractures.  He has done well with his fractures, he is off pain medication and is back at the gym ?History of chronic kidney disease, sleep apnea, hypertension, gastric bypass in September 2020, SS trait  ? ?He has been under some stress recently, difficulty in his relationship - his husband moved out yesterday, there was concern of infidelity; Kharson has found evidence that his husband may have been diagnosed with chlamydia but he is not sure ?Pt has not noted any sx of STI, no discharge or dysuria ? ?He has lost weight- he is not eating as much as he would like ?He is not sleeping well ?He is interested in using medication for insomnia and acute stress short-term.  He is on flecainide per cardiology which limits our medication options somewhat ? ?Denies any suicidal ideation ? ?Patient Active Problem List  ? Diagnosis Date Noted  ? Sickle cell trait ([place]) 05/12/2020  ? Chronic renal insufficiency   ? Fracture of base of fifth metacarpal bone of right hand   ? GERD (gastroesophageal reflux disease)   ? Hypertension   ? Morbid obesity ([place])   ? Positive TB test   ? Sleep apnea   ? Iron deficiency anemia secondary to inadequate dietary iron intake 12/04/2019  ? CKD (chronic kidney disease) stage 3, GFR 30-59 ml/min ([place]) 11/25/2018  ? OSA (obstructive sleep apnea)  10/28/2018  ? 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  ? Pneumonitis 10/04/2018  ? Obesity 08/18/2013  ? Tinea versicolor 05/19/2013  ? Hypertriglyceridemia 05/28/2012  ? Hypogonadism male 06/07/2011  ? Essential hypertension 09/21/2009  ? Disorder resulting from impaired renal function 05/01/2007  ? GERD 04/04/2007  ? ? ?Past Medical History:  ?Diagnosis Date  ? Chronic renal insufficiency   ? CKD (chronic kidney disease) stage 3, GFR 30-59 ml/min ([place]) 11/25/2018  ? Managed by [facility] Dr Carolin Sicks.  Secondary to hypertensive nephropathy   ? Disorder resulting from impaired renal function 05/01/2007  ? Thought due to hypertension, followed by Dr. Florene Glen    ? Essential hypertension 09/21/2009  ? Qualifier: Diagnosis of  By: Wynona Luna   ? Fracture of base of fifth metacarpal bone of right hand   ? GERD 04/04/2007  ? Qualifier: Diagnosis of  By: Wynona Luna   ? GERD (gastroesophageal reflux disease)   ? hx of   ? Hypertension   ? Hypertriglyceridemia 05/28/2012  ? Hypogonadism male 06/07/2011  ? Iron deficiency anemia secondary to inadequate dietary iron intake 12/04/2019  ? Morbid obesity ([place])   ? Obesity 08/18/2013  ? OSA (obstructive sleep apnea) 10/28/2018  ? Pneumonitis 10/04/2018  ? after accidental inhalation of swimming pool water during swimmng lesson  ? Positive TB test   ? over 20 years ago  ? S/P  gastric bypass 10/28/2018  ? Sleep apnea   ? Mild, no cpap needed  ? Tinea versicolor 05/19/2013  ? ? ?Past Surgical History:  ?Procedure Laterality Date  ? ADENOIDECTOMY    ? childhood  ? GASTRIC ROUX-EN-Y N/A 10/28/2018  ? Procedure: LAPAROSCOPIC ROUX-EN-Y GASTRIC BYPASS WITH UPPER ENDOSCOPY;  Surgeon: Greer Pickerel, MD;  Location: [facility];  Service: General;  Laterality: N/A;  ? HAND SURGERY Right 02/2016  ? UPPER GI ENDOSCOPY  03/10/2016  ? XI ROBOTIC ASSISTED INGUINAL HERNIA REPAIR WITH MESH Left 10/10/2019  ? Procedure: XI ROBOTIC ASSISTED LEFT INGUINAL HERNIA REPAIR WITH MESH;  Surgeon: Greer Pickerel, MD;   Location: [facility];  Service: General;  Laterality: Left;  ? ? ?Social History  ? ?Tobacco Use  ? Smoking status: Never  ? Smokeless tobacco: Never  ?Vaping Use  ? Vaping Use: Never used  ?Substance Use Topics  ? Alcohol use: No  ?  Alcohol/week: 0.0 standard drinks  ? Drug use: No  ? ? ?Family History  ?Problem Relation Age of Onset  ? Kidney cancer Mother 70  ? Hypertension Father   ?     age 31  ? Diabetes Father   ? Lung cancer Paternal Uncle   ? Colon cancer Neg Hx   ? Prostate cancer Neg Hx   ? Liver disease Neg Hx   ? Esophageal cancer Neg Hx   ? Stomach cancer Neg Hx   ? Pancreatic cancer Neg Hx   ? ? ?No Known Allergies ? ?Medication list has been reviewed and updated. ? ?Current Outpatient Medications on File Prior to Visit  ?Medication Sig Dispense Refill  ? Ferrous Sulfate (IRON PO) Take by mouth.    ? flecainide (TAMBOCOR) 50 MG tablet TAKE 1 TABLET BY MOUTH EVERY DAY 30 tablet 5  ? Multiple Vitamins-Minerals (MULTIVITAMIN WITH MINERALS) tablet Take 1 tablet by mouth daily.    ? ?No current facility-administered medications on file prior to visit.  ? ? ?Review of Systems: ? ?As per HPI- otherwise negative. ? ? ?Physical Examination: ?Vitals:  ? 06/08/21 0934  ?BP: 120/80  ?Pulse: 77  ?Resp: 18  ?Temp: 98.2 ?F (36.8 ?C)  ?SpO2: 99%  ? ?Vitals:  ? 06/08/21 0934  ?Weight: 197 lb 6.4 oz (89.5 kg)  ?Height: 6' (1.829 m)  ? ?Body mass index is 26.77 kg/m?. ?Ideal Body Weight: Weight in (lb) to have BMI = 25: 183.9 ? ?GEN: no acute distress.  Looks well, minimal overweight ?HEENT: Atraumatic, Normocephalic.  ?Ears and Nose: No external deformity. ?CV: RRR, No M/G/R. No JVD. No thrill. No extra heart sounds. ?PULM: CTA B, no wheezes, crackles, rhonchi. No retractions. No resp. distress. No accessory muscle use. ?EXTR: No c/c/e ?PSYCH: Normally interactive. Conversant.  ?No penile discharge or lesions noted ? ?Assessment and Plan: ?Acute stress reaction - Plan: ALPRAZolam (XANAX) 0.5 MG tablet ? ?Routine screening  for STI (sexually transmitted infection) - Plan: HIV Antibody (routine testing w rflx), Hepatitis C antibody, RPR, Hepatitis B surface antibody,quantitative, Hepatitis B surface antigen, GC/Chlamydia Probe Amp ? ?Patient seen today with concern of stress and STI screening ?His husband may have been diagnosed with chlamydia, we are not entirely sure.  I will certainly test him today.  If he does find out with certainty that husband has chlamydia we can also treat him presumptively ? ?Prescribed alprazolam to use short-term for insomnia and acute distress.  Discussed safer use with patient ? ?Signed ?Lamar Blinks, MD ? ?

## 2021-06-08 ENCOUNTER — Ambulatory Visit (INDEPENDENT_AMBULATORY_CARE_PROVIDER_SITE_OTHER): Payer: BC Managed Care – PPO | Admitting: Family Medicine

## 2021-06-08 VITALS — BP 120/80 | HR 77 | Temp 98.2°F | Resp 18 | Ht 72.0 in | Wt 197.4 lb

## 2021-06-08 DIAGNOSIS — Z113 Encounter for screening for infections with a predominantly sexual mode of transmission: Secondary | ICD-10-CM | POA: Diagnosis not present

## 2021-06-08 DIAGNOSIS — F430 Acute stress reaction: Secondary | ICD-10-CM | POA: Diagnosis not present

## 2021-06-08 MED ORDER — ALPRAZOLAM 0.5 MG PO TABS: 0.2500 mg | ORAL_TABLET | Freq: Two times a day (BID) | ORAL | 0 refills | Status: DC | PRN

## 2021-06-08 NOTE — Patient Instructions (Signed)
Good to see you today- I am sorry you are going through such a hard time. ?I will be in touch with your labs asap ? ?Ok to use alprazolam as needed- just use caution, use as little as possible as it may habit forming.  Do not use with alcohol or when driving  ?

## 2021-06-09 ENCOUNTER — Encounter: Payer: Self-pay | Admitting: Family Medicine

## 2021-06-09 LAB — HEPATITIS B SURFACE ANTIBODY, QUANTITATIVE: Hep B S AB Quant (Post): 47 m[IU]/mL (ref >=10)

## 2021-06-09 LAB — HEPATITIS B SURFACE ANTIGEN: Hepatitis B Surface Ag: NONREACTIVE

## 2021-06-09 LAB — HIV ANTIBODY (ROUTINE TESTING W REFLEX): HIV 1&2 Ab, 4th Generation: NONREACTIVE

## 2021-06-09 LAB — HEPATITIS C ANTIBODY
Hepatitis C Ab: NONREACTIVE
SIGNAL TO CUT-OFF: 0.08 (ref <1.00)

## 2021-06-09 LAB — RPR: RPR Ser Ql: NONREACTIVE

## 2021-06-10 LAB — GC/CHLAMYDIA PROBE AMP
Chlamydia trachomatis, NAA: NEGATIVE
Neisseria Gonorrhoeae by PCR: NEGATIVE

## 2021-08-02 DIAGNOSIS — S52572A Other intraarticular fracture of lower end of left radius, initial encounter for closed fracture: Secondary | ICD-10-CM | POA: Diagnosis not present

## 2021-08-02 DIAGNOSIS — S52571A Other intraarticular fracture of lower end of right radius, initial encounter for closed fracture: Secondary | ICD-10-CM | POA: Diagnosis not present

## 2021-09-08 NOTE — Progress Notes (Addendum)
[place] at [facility] [address], [place], [place] [address] [phone]  Date:  09/14/2021   Name:  Erik Weber   DOB:  01-24-1976   MRN:  [id number]  PCP:  Darreld Mclean, MD    Chief Complaint: 6 month follow up (Concerns/ questions: pts last cpe was 09/03/20- he would like physical. /)   History of Present Illness:  Erik Weber is a 46 y.o. very pleasant male patient who presents with the following:  Pt seen today for a physical  Last seen by myself in February for routine visit and in May to discuss stress from recent break up with husband Harrell Gave; I gave him some alprazolam at that time.  He has not really needed to use this -he feels like he is in a good place emotionally at this time History of chronic kidney disease, sleep apnea, hypertension, gastric bypass in September 2020, SS trait  In November he fell of a ladder and suffered bilateral wrist fractures, has been seeing Dr. Burney Gauze and completed occupational therapy He followed up recently- he seems to be making ok progress He has good function now   Nephrology-[facility]- seen in June  Cardiology-Dr. Harriet Masson Electrophysiology-Dr. Curt Bears  Seen by cardiology in Feb: 1.  PVCs:  EKG today shows NSR on flecainide.  High risk medication monitoring.   EF normal.  He is taking flecainide 50 mg daily. His PR interval varies between 190-230 looks back as far as 04/2020. 2.  Mobitz 1 AV block:  Diltiazem stopped due to Mobitz 1 AV block.   If he does have further atrial arrhythmias, may need to restart this. Would need to follow very closely.   His daughter is 30 yo  Patient Active Problem List   Diagnosis Date Noted   Sickle cell trait ([place]) 05/12/2020   Chronic renal insufficiency    Fracture of base of fifth metacarpal bone of right hand    GERD (gastroesophageal reflux disease)    Hypertension    Morbid obesity ([place])    Positive TB test     Sleep apnea    Iron deficiency anemia secondary to inadequate dietary iron intake 12/04/2019   CKD (chronic kidney disease) stage 3, GFR 30-59 ml/min ([facility]) 11/25/2018   OSA (obstructive sleep apnea) 10/28/2018   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   Pneumonitis 10/04/2018   Obesity 08/18/2013   Tinea versicolor 05/19/2013   Hypertriglyceridemia 05/28/2012   Hypogonadism male 06/07/2011   Essential hypertension 09/21/2009   Disorder resulting from impaired renal function 05/01/2007   GERD 04/04/2007    Past Medical History:  Diagnosis Date   Chronic renal insufficiency    CKD (chronic kidney disease) stage 3, GFR 30-59 ml/min ([place]) 11/25/2018   Managed by [facility] Dr Carolin Sicks.  Secondary to hypertensive nephropathy    Disorder resulting from impaired renal function 05/01/2007   Thought due to hypertension, followed by Dr. Florene Glen     Essential hypertension 09/21/2009   Qualifier: Diagnosis of  By: Wynona Luna    Fracture of base of fifth metacarpal bone of right hand    GERD 04/04/2007   Qualifier: Diagnosis of  By: Wynona Luna    GERD (gastroesophageal reflux disease)    hx of    Hypertension    Hypertriglyceridemia 05/28/2012   Hypogonadism male 06/07/2011   Iron deficiency anemia secondary to inadequate dietary iron intake 12/04/2019   Morbid obesity ([place])  Obesity 08/18/2013   OSA (obstructive sleep apnea) 10/28/2018   Pneumonitis 10/04/2018   after accidental inhalation of swimming pool water during swimmng lesson   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   Sleep apnea    Mild, no cpap needed   Tinea versicolor 05/19/2013    Past Surgical History:  Procedure Laterality Date   ADENOIDECTOMY     childhood   GASTRIC ROUX-EN-Y N/A 10/28/2018   Procedure: LAPAROSCOPIC ROUX-EN-Y GASTRIC BYPASS WITH UPPER ENDOSCOPY;  Surgeon: Gaynelle Adu, MD;  [facility];  Service: General;  Laterality: N/A;   HAND SURGERY Right 02/2016   UPPER  GI ENDOSCOPY  03/10/2016   XI ROBOTIC ASSISTED INGUINAL HERNIA REPAIR WITH MESH Left 10/10/2019   Procedure: XI ROBOTIC ASSISTED LEFT INGUINAL HERNIA REPAIR WITH MESH;  Surgeon: Gaynelle Adu, MD;  [facility];  Service: General;  Laterality: Left;    Social History   Tobacco Use   Smoking status: Never   Smokeless tobacco: Never  Vaping Use   Vaping Use: Never used  Substance Use Topics   Alcohol use: No    Alcohol/week: 0.0 standard drinks of alcohol   Drug use: No    Family History  Problem Relation Age of Onset   Kidney cancer Mother 69   Hypertension Father        age 10   Diabetes Father    Lung cancer Paternal Uncle    Colon cancer Neg Hx    Prostate cancer Neg Hx    Liver disease Neg Hx    Esophageal cancer Neg Hx    Stomach cancer Neg Hx    Pancreatic cancer Neg Hx     No Known Allergies  Medication list has been reviewed and updated.  Current Outpatient Medications on File Prior to Visit  Medication Sig Dispense Refill   Ferrous Sulfate (IRON PO) Take by mouth.     flecainide (TAMBOCOR) 50 MG tablet TAKE 1 TABLET BY MOUTH EVERY DAY 30 tablet 5   Multiple Vitamins-Minerals (MULTIVITAMIN WITH MINERALS) tablet Take 1 tablet by mouth daily.     No current facility-administered medications on file prior to visit.    Review of Systems:  As per HPI- otherwise negative. He is fasting this am   Physical Examination: Vitals:   09/14/21 0906  BP: 122/80  Pulse: (!) 53  Resp: 18  Temp: 97.6 F (36.4 C)  SpO2: 96%   Vitals:   09/14/21 0906  Weight: 204 lb 12.8 oz (92.9 kg)  Height: 6' (1.829 m)   Body mass index is 27.78 kg/m. Ideal Body Weight: Weight in (lb) to have BMI = 25: 183.9  GEN: no acute distress.  Looks well, minimal overweight HEENT: Atraumatic, Normocephalic.  Bilateral TM wnl, oropharynx normal.  PEERL,EOMI.   Ears and Nose: No external deformity. CV: RRR, No M/G/R. No JVD. No thrill. No extra heart sounds. PULM: CTA B, no  wheezes, crackles, rhonchi. No retractions. No resp. distress. No accessory muscle use. ABD: S, NT, ND, +BS. No rebound. No HSM. EXTR: No c/c/e PSYCH: Normally interactive. Conversant.    Assessment and Plan: Physical exam  Essential hypertension - Plan: CBC, Comprehensive metabolic panel  Acute stress reaction  Stage 3 chronic kidney disease, unspecified whether stage 3a or 3b CKD ([facility]) - Plan: Comprehensive metabolic panel  S/P gastric bypass - Plan: B12, Ferritin  Iron deficiency  Screening for hyperlipidemia - Plan: Lipid panel  Physical exam  today.  Encouraged healthy diet and exercise routine Blood pressure under good control Taking only flecainide right now for PVCs Will plan further follow- up pending labs.   Signed Abbe Amsterdam, MD  Received labs as below, message to patient Results for orders placed or performed in visit on 09/14/21  CBC  Result Value Ref Range   WBC 8.5 4.0 - 10.5 K/uL   RBC 5.31 4.22 - 5.81 Mil/uL   Platelets 216.0 150.0 - 400.0 K/uL   Hemoglobin 15.4 13.0 - 17.0 g/dL   HCT 35.4 56.2 - 56.3 %   MCV 89.1 78.0 - 100.0 fl   MCHC 32.6 30.0 - 36.0 g/dL   RDW 89.3 73.4 - 28.7 %  Comprehensive metabolic panel  Result Value Ref Range   Sodium 140 135 - 145 mEq/L   Potassium 4.7 3.5 - 5.1 mEq/L   Chloride 101 96 - 112 mEq/L   CO2 34 (H) 19 - 32 mEq/L   Glucose, Bld 79 70 - 99 mg/dL   BUN 10 6 - 23 mg/dL   Creatinine, Ser 6.81 0.40 - 1.50 mg/dL   Total Bilirubin 1.0 0.2 - 1.2 mg/dL   Alkaline Phosphatase 74 39 - 117 U/L   AST 21 0 - 37 U/L   ALT 28 0 - 53 U/L   Total Protein 7.2 6.0 - 8.3 g/dL   Albumin 4.6 3.5 - 5.2 g/dL   GFR 15.72 (L) >62.03 mL/min   Calcium 9.5 8.4 - 10.5 mg/dL  Lipid panel  Result Value Ref Range   Cholesterol 172 0 - 200 mg/dL   Triglycerides 559.7 0.0 - 149.0 mg/dL   HDL 41.63 >84.53 mg/dL   VLDL 64.6 0.0 - 80.3 mg/dL   LDL Cholesterol 86 0 - 99 mg/dL   Total CHOL/HDL Ratio 3    NonHDL 113.99   B12   Result Value Ref Range   Vitamin B-12 361 211 - 911 pg/mL  Ferritin  Result Value Ref Range   Ferritin 13.0 (L) 22.0 - 322.0 ng/mL

## 2021-09-14 ENCOUNTER — Ambulatory Visit (INDEPENDENT_AMBULATORY_CARE_PROVIDER_SITE_OTHER): Payer: BC Managed Care – PPO | Admitting: Family Medicine

## 2021-09-14 ENCOUNTER — Encounter: Payer: Self-pay | Admitting: Family

## 2021-09-14 VITALS — BP 122/80 | HR 53 | Temp 97.6°F | Resp 18 | Ht 72.0 in | Wt 204.8 lb

## 2021-09-14 DIAGNOSIS — I10 Essential (primary) hypertension: Secondary | ICD-10-CM

## 2021-09-14 DIAGNOSIS — Z9884 Bariatric surgery status: Secondary | ICD-10-CM

## 2021-09-14 DIAGNOSIS — F430 Acute stress reaction: Secondary | ICD-10-CM

## 2021-09-14 DIAGNOSIS — Z0000 Encounter for general adult medical examination without abnormal findings: Secondary | ICD-10-CM

## 2021-09-14 DIAGNOSIS — Z13220 Encounter for screening for lipoid disorders: Secondary | ICD-10-CM | POA: Diagnosis not present

## 2021-09-14 DIAGNOSIS — N183 Chronic kidney disease, stage 3 unspecified: Secondary | ICD-10-CM | POA: Diagnosis not present

## 2021-09-14 DIAGNOSIS — E611 Iron deficiency: Secondary | ICD-10-CM

## 2021-09-14 LAB — COMPREHENSIVE METABOLIC PANEL
ALT: 28 U/L (ref 0–53)
AST: 21 U/L (ref 0–37)
Albumin: 4.6 g/dL (ref 3.5–5.2)
Alkaline Phosphatase: 74 U/L (ref 39–117)
BUN: 10 mg/dL (ref 6–23)
CO2: 34 mEq/L — ABNORMAL HIGH (ref 19–32)
Calcium: 9.5 mg/dL (ref 8.4–10.5)
Chloride: 101 mEq/L (ref 96–112)
Creatinine, Ser: 1.43 mg/dL (ref 0.40–1.50)
GFR: 58.58 mL/min — ABNORMAL LOW (ref >60.00)
Glucose, Bld: 79 mg/dL (ref 70–99)
Potassium: 4.7 mEq/L (ref 3.5–5.1)
Sodium: 140 mEq/L (ref 135–145)
Total Bilirubin: 1 mg/dL (ref 0.2–1.2)
Total Protein: 7.2 g/dL (ref 6.0–8.3)

## 2021-09-14 LAB — CBC
HCT: 47.3 % (ref 39.0–52.0)
Hemoglobin: 15.4 g/dL (ref 13.0–17.0)
MCHC: 32.6 g/dL (ref 30.0–36.0)
MCV: 89.1 fl (ref 78.0–100.0)
Platelets: 216 10*3/uL (ref 150.0–400.0)
RBC: 5.31 Mil/uL (ref 4.22–5.81)
RDW: 13.2 % (ref 11.5–15.5)
WBC: 8.5 10*3/uL (ref 4.0–10.5)

## 2021-09-14 LAB — VITAMIN B12: Vitamin B-12: 361 pg/mL (ref 211–911)

## 2021-09-14 LAB — LIPID PANEL
Cholesterol: 172 mg/dL (ref 0–200)
HDL: 58 mg/dL (ref >39.00)
LDL Cholesterol: 86 mg/dL (ref 0–99)
NonHDL: 113.99
Total CHOL/HDL Ratio: 3
Triglycerides: 138 mg/dL (ref 0.0–149.0)
VLDL: 27.6 mg/dL (ref 0.0–40.0)

## 2021-09-14 LAB — FERRITIN: Ferritin: 13 ng/mL — ABNORMAL LOW (ref 22.0–322.0)

## 2021-09-14 NOTE — Addendum Note (Signed)
Addended by: Abbe Amsterdam C on: 09/14/2021 05:39 PM   Modules accepted: Orders

## 2021-09-14 NOTE — Patient Instructions (Signed)
Great to see you today!  I will be in touch with your labs Assuming all is well please see me in about 6 months Plan for flu shot and covid booster if recommended this fall

## 2021-09-19 ENCOUNTER — Encounter: Payer: Self-pay | Admitting: Family Medicine

## 2021-10-11 DIAGNOSIS — Z0389 Encounter for observation for other suspected diseases and conditions ruled out: Secondary | ICD-10-CM | POA: Diagnosis not present

## 2021-10-11 DIAGNOSIS — S63511A Sprain of carpal joint of right wrist, initial encounter: Secondary | ICD-10-CM | POA: Diagnosis not present

## 2021-10-11 DIAGNOSIS — S52591D Other fractures of lower end of right radius, subsequent encounter for closed fracture with routine healing: Secondary | ICD-10-CM | POA: Diagnosis not present

## 2021-10-11 DIAGNOSIS — M7989 Other specified soft tissue disorders: Secondary | ICD-10-CM | POA: Diagnosis not present

## 2021-10-15 ENCOUNTER — Other Ambulatory Visit: Payer: Self-pay | Admitting: Cardiology

## 2021-10-28 DIAGNOSIS — S63591A Other specified sprain of right wrist, initial encounter: Secondary | ICD-10-CM | POA: Diagnosis not present

## 2021-10-28 DIAGNOSIS — M24131 Other articular cartilage disorders, right wrist: Secondary | ICD-10-CM | POA: Diagnosis not present

## 2022-01-22 ENCOUNTER — Encounter: Payer: Self-pay | Admitting: Family Medicine

## 2022-01-23 ENCOUNTER — Ambulatory Visit: Payer: BC Managed Care – PPO | Admitting: Family Medicine

## 2022-01-23 NOTE — Progress Notes (Unsigned)
[facility] [address] [place], [place] [address] [phone]  Date:  01/25/2022   Name:  Erik Weber   DOB:  [birth year]   MRN:  [id number]  PCP:  Pearline Cables, MD    Chief Complaint: Dizziness (Pt suspects this Is vertigo. X 3-4 months. He does have some HA's with the dizziness. )   History of Present Illness:  Erik Weber is a 46 y.o. very pleasant male patient who presents with the following:  Pt seen today with concern of dizziness Last seen by myself in August  History of chronic kidney disease, sleep apnea, hypertension, gastric bypass in September 2020, SS trait   Nephrology-[facility]- seen in June  Cardiology-Dr. Servando Salina Electrophysiology-Dr. Elberta Fortis  Her has a teenage daughter   Flu shot-already done Recommend covid vaccine-already done Abrahm got in touch via mychart 2 days ago  I've experiencing for severe dizzy spells for a few months.Marland KitchenMarland KitchenThey can come when I'm sitting or walking... Not sure if it may be associated with my iron or maybe vertigo or something.   Labs done in August- CMP, CBC, lipid   Pt notes that about 3 -4 months ago he was seated at his desk working when he had sudden vertigo.  He thought it might be blood sugar related so he has tried eating something sweet but it did not help He has continued to have episodes of vertigo since then Episodes tend to last 30- 60 seconds Can occur with head movement or even when he is still  Can be nauseated but no vomiting Is occurring several times a day He is not aware of anything that makes it worse He has noted headaches as well for about 1-2 weeks Aleve helps No change in vision or hearing  One episode of tinnitus but it did not persist     Patient Active Problem List   Diagnosis Date Noted   Sickle cell trait ([facility]) 05/12/2020   Chronic renal insufficiency    Fracture of base of fifth metacarpal bone of right  hand    GERD (gastroesophageal reflux disease)    Hypertension    Morbid obesity ([facility])    Positive TB test    Sleep apnea    Iron deficiency anemia secondary to inadequate dietary iron intake 12/04/2019   CKD (chronic kidney disease) stage 3, GFR 30-59 ml/min ([facility]) 11/25/2018   OSA (obstructive sleep apnea) 10/28/2018   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   Pneumonitis 10/04/2018   Obesity 08/18/2013   Tinea versicolor 05/19/2013   Hypertriglyceridemia 05/28/2012   Hypogonadism male 06/07/2011   Essential hypertension 09/21/2009   Disorder resulting from impaired renal function 05/01/2007   GERD 04/04/2007    Past Medical History:  Diagnosis Date   Chronic renal insufficiency    CKD (chronic kidney disease) stage 3, GFR 30-59 ml/min (HCC) 11/25/2018   Managed by [facility] Dr Ronalee Belts.  Secondary to hypertensive nephropathy    Disorder resulting from impaired renal function 05/01/2007   Thought due to hypertension, followed by Dr. Lowell Guitar     Essential hypertension 09/21/2009   Qualifier: Diagnosis of  By: Nena Jordan    Fracture of base of fifth metacarpal bone of right hand    GERD 04/04/2007   Qualifier: Diagnosis of  By: Nena Jordan    GERD (gastroesophageal reflux disease)    hx of    Hypertension  Hypertriglyceridemia 05/28/2012   Hypogonadism male 06/07/2011   Iron deficiency anemia secondary to inadequate dietary iron intake 12/04/2019   Morbid obesity ([facility])    Obesity 08/18/2013   OSA (obstructive sleep apnea) 10/28/2018   Pneumonitis 10/04/2018   after accidental inhalation of swimming pool water during swimmng lesson   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   Sleep apnea    Mild, no cpap needed   Tinea versicolor 05/19/2013    Past Surgical History:  Procedure Laterality Date   ADENOIDECTOMY     childhood   GASTRIC ROUX-EN-Y N/A 10/28/2018   Procedure: LAPAROSCOPIC ROUX-EN-Y GASTRIC BYPASS WITH UPPER ENDOSCOPY;   Surgeon: Gaynelle Adu, MD;  [facility];  Service: General;  Laterality: N/A;   HAND SURGERY Right 02/2016   UPPER GI ENDOSCOPY  03/10/2016   XI ROBOTIC ASSISTED INGUINAL HERNIA REPAIR WITH MESH Left 10/10/2019   Procedure: XI ROBOTIC ASSISTED LEFT INGUINAL HERNIA REPAIR WITH MESH;  Surgeon: Gaynelle Adu, MD;  Location[facility];  Service: General;  Laterality: Left;    Social History   Tobacco Use   Smoking status: Never   Smokeless tobacco: Never  Vaping Use   Vaping Use: Never used  Substance Use Topics   Alcohol use: No    Alcohol/week: 0.0 standard drinks of alcohol   Drug use: No    Family History  Problem Relation Age of Onset   Kidney cancer Mother 63   Hypertension Father        age 45   Diabetes Father    Lung cancer Paternal Uncle    Colon cancer Neg Hx    Prostate cancer Neg Hx    Liver disease Neg Hx    Esophageal cancer Neg Hx    Stomach cancer Neg Hx    Pancreatic cancer Neg Hx     No Known Allergies  Medication list has been reviewed and updated.  Current Outpatient Medications on File Prior to Visit  Medication Sig Dispense Refill   Ferrous Sulfate (IRON PO) Take by mouth.     flecainide (TAMBOCOR) 50 MG tablet TAKE 1 TABLET BY MOUTH EVERY DAY 90 tablet 1   Multiple Vitamins-Minerals (MULTIVITAMIN WITH MINERALS) tablet Take 1 tablet by mouth daily.     No current facility-administered medications on file prior to visit.    Review of Systems:  As per HPI- otherwise negative.   Physical Examination: Vitals:   01/25/22 0959  BP: 136/88  Pulse: 71  Temp: 97.6 F (36.4 C)  SpO2: 97%   Vitals:   01/25/22 0959  Weight: 219 lb 6.4 oz (99.5 kg)  Height: 6' (1.829 m)   Body mass index is 29.76 kg/m. Ideal Body Weight: Weight in (lb) to have BMI = 25: 183.9  GEN: no acute distress.  Overweight, looks well HEENT: Atraumatic, Normocephalic.  Bilateral TM wnl, oropharynx normal.  PEERL,EOMI.   Ears and Nose: No external deformity. CV: RRR,  No M/G/R. No JVD. No thrill. No extra heart sounds. PULM: CTA B, no wheezes, crackles, ronchi. No retractions. No resp. distress. No accessory muscle use. ABD: S, NT, ND, +BS. No rebound. No HSM. EXTR: No c/c/e PSYCH: Normally interactive. Conversant.  Normal neuroexam including strength, sensation, deep tendon reflex of all limbs, normal sensation and movement of facial muscles, normal Romberg and tandem stance.  Dix-Hallpike is negative  Assessment and Plan: Vertigo - Plan: CBC, Comprehensive metabolic panel, TSH, MR Brain Wo Contrast  Patient seen today with concern of vertigo.  His neuroexam is normal, but his symptoms do not really fit with BPPV, or Mnire's disease.  It is possible this could explain more significant, discussed with patient he would like to pursue imaging. Labs are pending as above, ordered MRI of his brain.  I asked him to let me know if symptoms should worsen or change in the meantime  Signed Abbe Amsterdam, MD  Received labs as below, message to patient Results for orders placed or performed in visit on 01/25/22  CBC  Result Value Ref Range   WBC 8.5 4.0 - 10.5 K/uL   RBC 5.31 4.22 - 5.81 Mil/uL   Platelets 214.0 150.0 - 400.0 K/uL   Hemoglobin 15.7 13.0 - 17.0 g/dL   HCT 59.7 41.6 - 38.4 %   MCV 89.3 78.0 - 100.0 fl   MCHC 33.0 30.0 - 36.0 g/dL   RDW 53.6 46.8 - 03.2 %  Comprehensive metabolic panel  Result Value Ref Range   Sodium 140 135 - 145 mEq/L   Potassium 4.5 3.5 - 5.1 mEq/L   Chloride 102 96 - 112 mEq/L   CO2 31 19 - 32 mEq/L   Glucose, Bld 65 (L) 70 - 99 mg/dL   BUN 14 6 - 23 mg/dL   Creatinine, Ser 1.22 0.40 - 1.50 mg/dL   Total Bilirubin 0.7 0.2 - 1.2 mg/dL   Alkaline Phosphatase 81 39 - 117 U/L   AST 21 0 - 37 U/L   ALT 22 0 - 53 U/L   Total Protein 7.1 6.0 - 8.3 g/dL   Albumin 4.0 3.5 - 5.2 g/dL   GFR 48.25 (L) >00.37 mL/min   Calcium 9.6 8.4 - 10.5 mg/dL  TSH  Result Value Ref Range   TSH 1.30 0.35 - 5.50 uIU/mL

## 2022-01-23 NOTE — Patient Instructions (Incomplete)
Good to see you again today- I will get your MRI set up and be in touch asap Let me know if anything changes or is getting worse, or any other concerns

## 2022-01-25 ENCOUNTER — Encounter: Payer: Self-pay | Admitting: Family Medicine

## 2022-01-25 ENCOUNTER — Ambulatory Visit (INDEPENDENT_AMBULATORY_CARE_PROVIDER_SITE_OTHER): Payer: BC Managed Care – PPO | Admitting: Family Medicine

## 2022-01-25 VITALS — BP 136/88 | HR 71 | Temp 97.6°F | Ht 72.0 in | Wt 219.4 lb

## 2022-01-25 DIAGNOSIS — R42 Dizziness and giddiness: Secondary | ICD-10-CM

## 2022-01-25 LAB — COMPREHENSIVE METABOLIC PANEL
ALT: 22 U/L (ref 0–53)
AST: 21 U/L (ref 0–37)
Albumin: 4 g/dL (ref 3.5–5.2)
Alkaline Phosphatase: 81 U/L (ref 39–117)
BUN: 14 mg/dL (ref 6–23)
CO2: 31 mEq/L (ref 19–32)
Calcium: 9.6 mg/dL (ref 8.4–10.5)
Chloride: 102 mEq/L (ref 96–112)
Creatinine, Ser: 1.46 mg/dL (ref 0.40–1.50)
GFR: 57 mL/min — ABNORMAL LOW (ref >60.00)
Glucose, Bld: 65 mg/dL — ABNORMAL LOW (ref 70–99)
Potassium: 4.5 mEq/L (ref 3.5–5.1)
Sodium: 140 mEq/L (ref 135–145)
Total Bilirubin: 0.7 mg/dL (ref 0.2–1.2)
Total Protein: 7.1 g/dL (ref 6.0–8.3)

## 2022-01-25 LAB — CBC
HCT: 47.4 % (ref 39.0–52.0)
Hemoglobin: 15.7 g/dL (ref 13.0–17.0)
MCHC: 33 g/dL (ref 30.0–36.0)
MCV: 89.3 fl (ref 78.0–100.0)
Platelets: 214 10*3/uL (ref 150.0–400.0)
RBC: 5.31 Mil/uL (ref 4.22–5.81)
RDW: 12.7 % (ref 11.5–15.5)
WBC: 8.5 10*3/uL (ref 4.0–10.5)

## 2022-01-25 LAB — TSH: TSH: 1.3 u[IU]/mL (ref 0.35–5.50)

## 2022-01-27 ENCOUNTER — Ambulatory Visit (HOSPITAL_COMMUNITY)
Admission: RE | Admit: 2022-01-27 | Discharge: 2022-01-27 | Disposition: A | Discharge status: Home / Self Care | Payer: BC Managed Care – PPO | Source: Ambulatory Visit | Attending: Family Medicine | Admitting: Family Medicine

## 2022-01-27 DIAGNOSIS — R42 Dizziness and giddiness: Secondary | ICD-10-CM | POA: Diagnosis present

## 2022-01-28 ENCOUNTER — Encounter: Payer: Self-pay | Admitting: Family Medicine

## 2022-01-28 DIAGNOSIS — R42 Dizziness and giddiness: Secondary | ICD-10-CM

## 2022-02-01 ENCOUNTER — Encounter: Payer: Self-pay | Admitting: Family

## 2022-02-01 ENCOUNTER — Ambulatory Visit: Payer: BC Managed Care – PPO | Admitting: Family Medicine

## 2022-02-07 ENCOUNTER — Other Ambulatory Visit: Payer: Self-pay | Admitting: Psychiatry

## 2022-02-07 ENCOUNTER — Ambulatory Visit: Payer: BC Managed Care – PPO | Admitting: Psychiatry

## 2022-02-07 ENCOUNTER — Encounter: Payer: Self-pay | Admitting: Psychiatry

## 2022-02-07 VITALS — BP 131/88 | HR 67 | Ht 72.0 in | Wt 218.0 lb

## 2022-02-07 DIAGNOSIS — R42 Dizziness and giddiness: Secondary | ICD-10-CM

## 2022-02-07 MED ORDER — TOPIRAMATE 25 MG PO TABS: ORAL_TABLET | ORAL | 6 refills | Status: DC

## 2022-02-07 MED ORDER — MECLIZINE HCL 25 MG PO TABS: 25.0000 mg | ORAL_TABLET | Freq: Three times a day (TID) | ORAL | 6 refills | Status: DC | PRN

## 2022-02-07 NOTE — Progress Notes (Signed)
[facility]  PATIENT: Erik Weber DOB: [birth year]  REFERRING CLINICIAN: Copland, Gay Filler, MD HISTORY FROM: self REASON FOR VISIT: vertigo   HISTORICAL  CHIEF COMPLAINT:  Chief Complaint  Patient presents with   New Patient (Initial Visit)    Patient in room #1 and alone. Pt here today to discuss dizzy spells and Vertigo.    HISTORY OF PRESENT ILLNESS:  The patient presents for evaluation of vertigo which began 4 months ago. No clear inciting events for onset of symptoms. States he was sitting working from home one day when the room suddenly started spinning. Initially did not think much of it, then vertigo started to occur more frequently. Now he has symptoms daily. Vertigo will lasts for about 30-40 seconds at a time and can occur multiple times per day. It generally occurs when he is walking. Otherwise it does not seem positional. He has also started hearing intermittent whooshing in his ears. Even though vertigo episodes are relatively short, he will sometimes feel unwell for the rest of the day. He started to have bifrontal headaches associated with his vertigo 3 weeks ago. Headaches are mild and typically resolve with Aleve. They are not positional. He does not endorse significant photophobia or phonophobia. No vision changes. Has occasionally had nausea with the vertigo.  He denies history of migraines, though per chart review he has been prescribed Imitrex for unclear reasons in the past.  Brain MRI 01/27/22 was normal.  OTHER MEDICAL CONDITIONS: CKD, OSA, HTN, iron deficiency, gastric bypass 2020, PVCs   REVIEW OF SYSTEMS: Full 14 system review of systems performed and negative with exception of: vertigo  ALLERGIES: No Known Allergies  HOME MEDICATIONS: Outpatient Medications Prior to Visit  Medication Sig Dispense Refill   Ferrous Sulfate (IRON PO) Take by mouth.     flecainide (TAMBOCOR) 50 MG tablet TAKE 1 TABLET BY MOUTH EVERY DAY 90  tablet 1   Multiple Vitamins-Minerals (MULTIVITAMIN WITH MINERALS) tablet Take 1 tablet by mouth daily.     No facility-administered medications prior to visit.    PAST MEDICAL HISTORY: Past Medical History:  Diagnosis Date   Chronic renal insufficiency    CKD (chronic kidney disease) stage 3, GFR 30-59 ml/min (HCC) 11/25/2018   Managed by [facility] Dr Carolin Sicks.  Secondary to hypertensive nephropathy    Disorder resulting from impaired renal function 05/01/2007   Thought due to hypertension, followed by Dr. Florene Glen     Essential hypertension 09/21/2009   Qualifier: Diagnosis of  By: Wynona Luna    Fracture of base of fifth metacarpal bone of right hand    GERD 04/04/2007   Qualifier: Diagnosis of  By: Wynona Luna    GERD (gastroesophageal reflux disease)    hx of    Hypertension    Hypertriglyceridemia 05/28/2012   Hypogonadism male 06/07/2011   Iron deficiency anemia secondary to inadequate dietary iron intake 12/04/2019   Morbid obesity ([place])    Obesity 08/18/2013   OSA (obstructive sleep apnea) 10/28/2018   Pneumonitis 10/04/2018   after accidental inhalation of swimming pool water during swimmng lesson   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   Sleep apnea    Mild, no cpap needed   Tinea versicolor 05/19/2013    PAST SURGICAL HISTORY: Past Surgical History:  Procedure Laterality Date   ADENOIDECTOMY     childhood   GASTRIC ROUX-EN-Y N/A 10/28/2018   Procedure:  LAPAROSCOPIC ROUX-EN-Y GASTRIC BYPASS WITH UPPER ENDOSCOPY;  Surgeon: Greer Pickerel, MD;  Location: [facility];  Service: General;  Laterality: N/A;   HAND SURGERY Right 02/2016   UPPER GI ENDOSCOPY  03/10/2016   XI ROBOTIC ASSISTED INGUINAL HERNIA REPAIR WITH MESH Left 10/10/2019   Procedure: XI ROBOTIC ASSISTED LEFT INGUINAL HERNIA REPAIR WITH MESH;  Surgeon: Greer Pickerel, MD;  Location: [facility];  Service: General;  Laterality: Left;    FAMILY HISTORY: Family History   Problem Relation Age of Onset   Kidney cancer Mother 1   Hypertension Father        age 51   Diabetes Father    Lung cancer Paternal Uncle    Colon cancer Neg Hx    Prostate cancer Neg Hx    Liver disease Neg Hx    Esophageal cancer Neg Hx    Stomach cancer Neg Hx    Pancreatic cancer Neg Hx     SOCIAL HISTORY: Social History   Socioeconomic History   Marital status: Married    Spouse name: Not on file   Number of children: 1   Years of education: 16   Highest education level: Not on file  Occupational History   Occupation: Heritage manager  Tobacco Use   Smoking status: Never   Smokeless tobacco: Never  Vaping Use   Vaping Use: Never used  Substance and Sexual Activity   Alcohol use: No    Alcohol/week: 0.0 standard drinks of alcohol   Drug use: No   Sexual activity: Yes    Partners: Male  Other Topics Concern   Not on file  Social History Narrative   Occupation: Works for lab   Single    Adopted daughter - 4   Moved from [place] 4 yrs ago.   Never Smoked   Alcohol use-no   Caffeine use/day:  None   Does Patient Exercise:  yes            Social Determinants of Health   Financial Resource Strain: Not on file  Food Insecurity: Not on file  Transportation Needs: Not on file  Physical Activity: Not on file  Stress: Not on file  Social Connections: Not on file  Intimate Partner Violence: Not on file     PHYSICAL EXAM  GENERAL EXAM/CONSTITUTIONAL: Vitals:  Vitals:   02/07/22 1408  BP: 131/88  Pulse: 67  Weight: 218 lb (98.9 kg)  Height: 6' (1.829 m)   Body mass index is 29.57 kg/m. Wt Readings from Last 3 Encounters:  02/07/22 218 lb (98.9 kg)  01/25/22 219 lb 6.4 oz (99.5 kg)  09/14/21 204 lb 12.8 oz (92.9 kg)    NEUROLOGIC: MENTAL STATUS:  awake, alert, oriented to person, place and time recent and remote memory intact normal attention and concentration language fluent, comprehension intact fund of knowledge  appropriate  CRANIAL NERVE:  2nd, 3rd, 4th, 6th - pupils equal and reactive to light, visual fields full to confrontation, extraocular muscles intact, no nystagmus 5th - facial sensation symmetric 7th - facial strength symmetric 8th - hearing intact 9th - palate elevates symmetrically, uvula midline 11th - shoulder shrug symmetric 12th - tongue protrusion midline  MOTOR:  normal bulk and tone, full strength in the BUE, BLE  SENSORY:  normal and symmetric to light touch all 4 extremities  COORDINATION:  finger-nose-finger intact bilaterally  REFLEXES:  deep tendon reflexes present and symmetric  GAIT/STATION:  normal  Dix Hallpike negative   DIAGNOSTIC DATA (LABS, IMAGING, TESTING) -  I reviewed patient records, labs, notes, testing and imaging myself where available.  Lab Results  Component Value Date   WBC 8.5 01/25/2022   HGB 15.7 01/25/2022   HCT 47.4 01/25/2022   MCV 89.3 01/25/2022   PLT 214.0 01/25/2022      Component Value Date/Time   NA 140 01/25/2022 1035   NA 141 04/06/2020 1017   K 4.5 01/25/2022 1035   CL 102 01/25/2022 1035   CO2 31 01/25/2022 1035   GLUCOSE 65 (L) 01/25/2022 1035   BUN 14 01/25/2022 1035   BUN 12 04/06/2020 1017   CREATININE 1.46 01/25/2022 1035   CREATININE 1.55 (H) 12/03/2019 1533   CREATININE 1.53 (H) 10/22/2012 1453   CALCIUM 9.6 01/25/2022 1035   PROT 7.1 01/25/2022 1035   ALBUMIN 4.0 01/25/2022 1035   AST 21 01/25/2022 1035   AST 21 12/03/2019 1533   ALT 22 01/25/2022 1035   ALT 27 12/03/2019 1533   ALKPHOS 81 01/25/2022 1035   BILITOT 0.7 01/25/2022 1035   BILITOT 0.6 12/03/2019 1533   GFRNONAA >60 12/25/2020 1158   GFRNONAA 56 (L) 12/03/2019 1533   GFRAA 64 03/11/2020 1150   Lab Results  Component Value Date   CHOL 172 09/14/2021   HDL 58.00 09/14/2021   LDLCALC 86 09/14/2021   LDLDIRECT 78.0 09/03/2020   TRIG 138.0 09/14/2021   CHOLHDL 3 09/14/2021   Lab Results  Component Value Date   HGBA1C 4.8  03/17/2021   Lab Results  Component Value Date   VITAMINB12 361 09/14/2021   Lab Results  Component Value Date   TSH 1.30 01/25/2022     ASSESSMENT AND PLAN  47 y.o. year old male with a history of CKD, OSA, HTN, iron deficiency, gastric bypass 2020, PVCs who presents for evaluation of vertigo. MRI brain 01/27/22 was normal. Neurological exam today is normal, and Dix-Hallpike is negative. Suspect he may be experiencing vestibular migraines as he has started to develop associated headaches and feeling generally unwell for several hours after he experiences vertigo. Will start Topamax for migraine prevention as he is currently experiencing symptoms daily. Meclizine prescribed as needed for breakthrough vertigo. If headaches worsen or vertigo does not respond to meclizine may consider PRN triptan for rescue.   No diagnosis found.    PLAN: -Start Topamax 25 mg QHS, increase by 25 mg weekly up to 100 mg QHS -Start meclizine 25 mg TID PRN for vertigo -Next steps: consider PRN triptan    Meds ordered this encounter  Medications   topiramate (TOPAMAX) 25 MG tablet    Sig: Take 25 mg (1 pill) at bedtime for one week, then increase to 50 mg (2 pills) at bedtime for one week, then take 75 mg (3 pills) at bedtime for one week, then take 100 mg (4 pills) at bedtime and continue on this dose    Dispense:  120 tablet    Refill:  6   meclizine (ANTIVERT) 25 MG tablet    Sig: Take 1 tablet (25 mg total) by mouth 3 (three) times daily as needed for dizziness.    Dispense:  30 tablet    Refill:  6    Return in about 7 months (around 09/08/2022).    Genia Harold, MD 02/07/22 2:42 PM  I spent an average of 32 minutes chart reviewing and counseling the patient, with at least 50% of the time face to face with the patient.   [facility] [address] [place], [place]  [address] [phone]  

## 2022-02-07 NOTE — Patient Instructions (Signed)
Start Topamax for headache and vertigo prevention. Take 25 mg (1 pill) at bedtime for one week, then increase to 50 mg (2 pills) at bedtime for one week, then take 75 mg (3 pills) at bedtime for one week, then take 100 mg (4 pills) at bedtime  Take meclizine up to 3 times a day as needed for vertigo

## 2022-03-01 ENCOUNTER — Telehealth: Payer: Self-pay | Admitting: Psychiatry

## 2022-03-01 NOTE — Telephone Encounter (Signed)
pT IS CALLING. rEQUESTING A REFILL ON topiramate (TOPAMAX) 25 MG tablet. SHOULD BE SENT TO [id number]

## 2022-03-01 NOTE — Telephone Encounter (Signed)
Please call pt back. Rx sent with refills by Dr. Billey Gosling 02/07/22. [facility]. He should contact pharmacy.

## 2022-03-02 MED ORDER — TOPIRAMATE 100 MG PO TABS: 100.0000 mg | ORAL_TABLET | Freq: Every day | ORAL | 1 refills | Status: DC

## 2022-03-02 NOTE — Addendum Note (Signed)
Addended by: Wyvonnia Lora on: 03/02/2022 11:47 AM   Modules accepted: Orders

## 2022-03-02 NOTE — Telephone Encounter (Signed)
Called pt to inform him that he should have refill for topiramate (TOPAMAX) 25 MG tablet at the pharmacy. Pt stated he needs to talk to nurse about the dosage on topiramate (TOPAMAX) 25 MG tablet. Stated the pharmacy is only give him 42 pills and that don't allow him to take medication like the prescription stated.

## 2022-03-02 NOTE — Telephone Encounter (Signed)
E-scribed [facility] as requested.

## 2022-03-02 NOTE — Telephone Encounter (Signed)
Called pt back. He got up to 100mg  po qhs. Ran out a couple days ago because the pharmacy didn't give the full 120tabs. I called in new rx for topamax 100mg  po qhs #90 (pt requested 90 days supply) to pharmacy for him. He verbalized understanding.

## 2022-03-02 NOTE — Telephone Encounter (Signed)
Pt called and stated that his  topiramate (TOPAMAX) 100 MG tablet was sent to the [facility] which is the wrong pharmacy and out of network for the pt. [facility] is [facility] [address]. I have updated the pt's pharmacy in chart.

## 2022-03-06 ENCOUNTER — Other Ambulatory Visit: Payer: Self-pay | Admitting: *Deleted

## 2022-03-07 ENCOUNTER — Other Ambulatory Visit: Payer: Self-pay

## 2022-03-07 MED ORDER — FLECAINIDE ACETATE 50 MG PO TABS: 50.0000 mg | ORAL_TABLET | Freq: Every day | ORAL | 0 refills | Status: DC

## 2022-03-20 ENCOUNTER — Ambulatory Visit: Payer: BC Managed Care – PPO | Admitting: Family Medicine

## 2022-05-02 ENCOUNTER — Other Ambulatory Visit: Payer: Self-pay | Admitting: Cardiology

## 2022-05-25 ENCOUNTER — Encounter (HOSPITAL_COMMUNITY): Payer: Self-pay | Admitting: *Deleted

## 2022-07-11 ENCOUNTER — Encounter: Payer: Self-pay | Admitting: Family

## 2022-08-02 ENCOUNTER — Encounter: Payer: Self-pay | Admitting: Family

## 2022-08-08 ENCOUNTER — Encounter: Payer: Self-pay | Admitting: Family

## 2022-08-20 ENCOUNTER — Other Ambulatory Visit: Payer: Self-pay | Admitting: Psychiatry

## 2022-08-20 ENCOUNTER — Other Ambulatory Visit: Payer: Self-pay | Admitting: Cardiology

## 2022-08-21 NOTE — Telephone Encounter (Signed)
Pt last seen on 02/07/22 Follow up scheduled on 09/19/22

## 2022-08-23 LAB — BASIC METABOLIC PANEL
BUN: 11 (ref 4–21)
CO2: 21 (ref 13–22)
Chloride: 109 — AB (ref 99–108)
Creatinine: 1.7 — AB (ref 0.6–1.3)
Glucose: 77
Potassium: 4.1 mEq/L (ref 3.5–5.1)
Sodium: 139 (ref 137–147)

## 2022-08-23 LAB — CBC AND DIFFERENTIAL
HCT: 48 (ref 41–53)
Hemoglobin: 15.8 (ref 13.5–17.5)
Platelets: 253 10*3/uL (ref 150–400)
WBC: 8.1

## 2022-08-23 LAB — COMPREHENSIVE METABOLIC PANEL: Calcium: 9 (ref 8.7–10.7)

## 2022-08-23 LAB — VITAMIN D 25 HYDROXY (VIT D DEFICIENCY, FRACTURES): Vit D, 25-Hydroxy: 28.1

## 2022-09-05 ENCOUNTER — Encounter: Payer: Self-pay | Admitting: Family

## 2022-09-08 ENCOUNTER — Other Ambulatory Visit: Payer: Self-pay | Admitting: Cardiology

## 2022-09-14 ENCOUNTER — Ambulatory Visit: Payer: Managed Care, Other (non HMO) | Attending: Student | Admitting: Student

## 2022-09-14 ENCOUNTER — Encounter: Payer: Self-pay | Admitting: Student

## 2022-09-14 VITALS — BP 124/70 | HR 65 | Ht 72.0 in | Wt 206.4 lb

## 2022-09-14 DIAGNOSIS — I493 Ventricular premature depolarization: Secondary | ICD-10-CM | POA: Diagnosis not present

## 2022-09-14 MED ORDER — FLECAINIDE ACETATE 50 MG PO TABS: 50.0000 mg | ORAL_TABLET | Freq: Every day | ORAL | 3 refills | Status: DC

## 2022-09-14 NOTE — Progress Notes (Signed)
[facility] [address], [address] [place], [place] [address] [phone]  Date:  09/18/2022   Name:  Erik Weber   DOB:  1975-11-13   MRN:  [id number]  PCP:  Pearline Cables, MD    Chief Complaint: Annual Exam   History of Present Illness:  Erik Weber is a 47 y.o. very pleasant male patient who presents with the following:  Patient seen today for a physical exam Most recent visit with myself was in December when he was seen for vertigo History of chronic kidney disease, sleep apnea, hypertension, gastric bypass in September 2020, SS trait, flecainide for PVCs per cardiology   In November 2023 he fell from a ladder and suffered bilateral wrist fractures-this is healing well   Nephrology-[facility]- seen in July, reviewed note-diagnosis of hypertensive nephropathy, baseline creatinine 1.3-1.9.  Nephrology did get blood work on 08/23/2022, creatinine 1.69-plan for annual recheck Cardiology-Dr. Tobb Electrophysiology-Dr. Elberta Fortis  Cologuard completed 11/21, negative.  Remind patient this will be due this coming November  Recommend flu shot, COVID booster this fall  He was seen by Dr. Andrey Campanile for follow-up of gastric bypass last month-he was doing well, decreasing body weight 25%  Most recent lab work on chart from December, CMP, TSH and CBC only He also had labs at nephrology office last month, will abstract-they also check vitamin D, minimally low at 28.1- he is using an OTC vitamin D now   Seen by cardiology last week:  PVCs:  EKG today shows NSR with stable intervals Continue flecainide NSR on flecainide.  High risk medication monitoring.   EF normal.  Continue flecainide 50 mg daily.    Mobitz 1 AV block:  Diltiazem previously stopped due to Mobitz 1 AV block.   If he does have further atrial arrhythmias, may need to restart this. Would need to follow very closely.    Patient Active Problem List    Diagnosis Date Noted   Sickle cell trait (HCC) 05/12/2020   Chronic renal insufficiency    Fracture of base of fifth metacarpal bone of right hand    GERD (gastroesophageal reflux disease)    Hypertension    Morbid obesity (HCC)    Positive TB test    Sleep apnea    Iron deficiency anemia secondary to inadequate dietary iron intake 12/04/2019   CKD (chronic kidney disease) stage 3, GFR 30-59 ml/min (HCC) 11/25/2018   OSA (obstructive sleep apnea) 10/28/2018   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   Pneumonitis 10/04/2018   Obesity 08/18/2013   Tinea versicolor 05/19/2013   Hypertriglyceridemia 05/28/2012   Hypogonadism male 06/07/2011   Essential hypertension 09/21/2009   Disorder resulting from impaired renal function 05/01/2007   GERD 04/04/2007    Past Medical History:  Diagnosis Date   Chronic renal insufficiency    CKD (chronic kidney disease) stage 3, GFR 30-59 ml/min (HCC) 11/25/2018   Managed by [facility] Dr Ronalee Belts.  Secondary to hypertensive nephropathy    Disorder resulting from impaired renal function 05/01/2007   Thought due to hypertension, followed by Dr. Lowell Guitar     Essential hypertension 09/21/2009   Qualifier: Diagnosis of  By: Nena Jordan    Fracture of base of fifth metacarpal bone of right hand    GERD 04/04/2007   Qualifier: Diagnosis of  By: Nena Jordan    GERD (gastroesophageal reflux disease)    hx of    Hypertension  Hypertriglyceridemia 05/28/2012   Hypogonadism male 06/07/2011   Iron deficiency anemia secondary to inadequate dietary iron intake 12/04/2019   Morbid obesity (HCC)    Obesity 08/18/2013   OSA (obstructive sleep apnea) 10/28/2018   Pneumonitis 10/04/2018   after accidental inhalation of swimming pool water during swimmng lesson   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   Sleep apnea    Mild, no cpap needed   Tinea versicolor 05/19/2013    Past Surgical History:  Procedure Laterality Date    ADENOIDECTOMY     childhood   GASTRIC ROUX-EN-Y N/A 10/28/2018   Procedure: LAPAROSCOPIC ROUX-EN-Y GASTRIC BYPASS WITH UPPER ENDOSCOPY;  Surgeon: Gaynelle Adu, MD;  Location: [facility];  Service: General;  Laterality: N/A;   HAND SURGERY Right 02/2016   UPPER GI ENDOSCOPY  03/10/2016   XI ROBOTIC ASSISTED INGUINAL HERNIA REPAIR WITH MESH Left 10/10/2019   Procedure: XI ROBOTIC ASSISTED LEFT INGUINAL HERNIA REPAIR WITH MESH;  Surgeon: Gaynelle Adu, MD;  Location: [facility];  Service: General;  Laterality: Left;    Social History   Tobacco Use   Smoking status: Never   Smokeless tobacco: Never  Vaping Use   Vaping status: Never Used  Substance Use Topics   Alcohol use: No    Alcohol/week: 0.0 standard drinks of alcohol   Drug use: No    Family History  Problem Relation Age of Onset   Kidney cancer Mother 35   Hypertension Father        age 90   Diabetes Father    Lung cancer Paternal Uncle    Colon cancer Neg Hx    Prostate cancer Neg Hx    Liver disease Neg Hx    Esophageal cancer Neg Hx    Stomach cancer Neg Hx    Pancreatic cancer Neg Hx     No Known Allergies  Medication list has been reviewed and updated.  Current Outpatient Medications on File Prior to Visit  Medication Sig Dispense Refill   Cholecalciferol (VITAMIN D-3 PO) Take 5,000 Units by mouth daily.     Ferrous Sulfate (IRON PO) Take 1 tablet by mouth daily.     flecainide (TAMBOCOR) 50 MG tablet Take 1 tablet (50 mg total) by mouth daily. 90 tablet 3   meclizine (ANTIVERT) 25 MG tablet Take 1 tablet (25 mg total) by mouth 3 (three) times daily as needed for dizziness. 30 tablet 6   Multiple Vitamins-Minerals (MULTIVITAMIN WITH MINERALS) tablet Take 1 tablet by mouth daily.     topiramate (TOPAMAX) 100 MG tablet TAKE 1 TABLET BY MOUTH EVERY DAY 90 tablet 0   No current facility-administered medications on file prior to visit.    Review of Systems:  As per HPI- otherwise negative.   Physical  Examination: Vitals:   09/18/22 0955  BP: 122/84  Pulse: 64  Resp: 18  Temp: 98.4 F (36.9 C)  SpO2: 99%   Vitals:   09/18/22 0955  Weight: 205 lb 9.6 oz (93.3 kg)  Height: 6' (1.829 m)   Body mass index is 27.88 kg/m. Ideal Body Weight: Weight in (lb) to have BMI = 25: 183.9  GEN: no acute distress.  Minimal overweight, looks well  HEENT: Atraumatic, Normocephalic.  Bilateral TM wnl, oropharynx normal.  PEERL,EOMI.   Ears and Nose: No external deformity. CV: RRR, No M/G/R. No JVD. No thrill. No extra heart sounds. PULM: CTA B, no wheezes, crackles, rhonchi. No retractions. No  resp. distress. No accessory muscle use. ABD: S, NT, ND, +BS. No rebound. No HSM. EXTR: No c/c/e PSYCH: Normally interactive. Conversant.    Assessment and Plan: Physical exam  Stage 3 chronic kidney disease, unspecified whether stage 3a or 3b CKD (HCC)  S/P gastric bypass - Plan: Ferritin, B12  Iron deficiency - Plan: Ferritin  Screening for hyperlipidemia - Plan: Lipid panel  Essential hypertension  Screening for diabetes mellitus - Plan: Hemoglobin A1c  Special screening, prostate cancer - Plan: PSA  Medication monitoring encounter - Plan: Hepatic function panel  Screening examination for STI - Plan: HIV Antibody (routine testing w rflx)  Physical exam today.  Encouraged healthy diet and exercise routine Will plan further follow- up pending labs. Doing well s/p gastric bypass  Advised this is a bit too soon to recheck his vitamin D now, we can certainly do this for him in a few months  Deficient blood work will be updated as above  Signed Abbe Amsterdam, MD  Results for orders placed or performed in visit on 09/18/22  Hemoglobin A1c  Result Value Ref Range   Hgb A1c MFr Bld 4.6 4.6 - 6.5 %  Lipid panel  Result Value Ref Range   Cholesterol 161 0 - 200 mg/dL   Triglycerides 161.0 0.0 - 149.0 mg/dL   HDL 96.04 >54.09 mg/dL   VLDL 81.1 0.0 - 91.4 mg/dL   LDL Cholesterol 91  0 - 99 mg/dL   Total CHOL/HDL Ratio 4    NonHDL 115.89   PSA  Result Value Ref Range   PSA 0.39 0.10 - 4.00 ng/mL  Hepatic function panel  Result Value Ref Range   Total Bilirubin 0.8 0.2 - 1.2 mg/dL   Bilirubin, Direct 0.1 0.0 - 0.3 mg/dL   Alkaline Phosphatase 91 39 - 117 U/L   AST 18 0 - 37 U/L   ALT 23 0 - 53 U/L   Total Protein 6.8 6.0 - 8.3 g/dL   Albumin 4.4 3.5 - 5.2 g/dL  Ferritin  Result Value Ref Range   Ferritin 53.3 22.0 - 322.0 ng/mL  B12  Result Value Ref Range   Vitamin B-12 287 211 - 911 pg/mL   Lab Results  Component Value Date   PSA 0.39 09/18/2022   PSA 0.29 03/17/2021   PSA 0.38 11/12/2019

## 2022-09-14 NOTE — Progress Notes (Signed)
  Electrophysiology Office Note:   Date:  09/14/2022  ID:  MARE NEISEN, DOB Nov 17, 1975, MRN [id number]  Primary Cardiologist: Thomasene Ripple, DO Electrophysiologist: Will Jorja Loa, MD      History of Present Illness:   Erik Weber is a 47 y.o. male with h/o PVCs seen today for routine electrophysiology followup.   Since last being seen in our clinic the patient reports doing very well.  he denies chest pain, palpitations, dyspnea, PND, orthopnea, nausea, vomiting, dizziness, syncope, edema, weight gain, or early satiety.   Review of systems complete and found to be negative unless listed in HPI.   EP Information / Studies Reviewed:    EKG is ordered today. Personal review as below.  EKG Interpretation Date/Time:  Thursday September 14 2022 12:08:18 EDT Ventricular Rate:  65 PR Interval:  206 QRS Duration:  90 QT Interval:  408 QTC Calculation: 424 R Axis:   43  Text Interpretation: Normal sinus rhythm Normal ECG Confirmed by Maxine Glenn [phone]) on 09/14/2022 12:14:31 PM    Physical Exam:   VS:  BP 124/70   Pulse 65   Ht 6' (1.829 m)   Wt 206 lb 6.4 oz (93.6 kg)   SpO2 98%   BMI 27.99 kg/m    Wt Readings from Last 3 Encounters:  09/14/22 206 lb 6.4 oz (93.6 kg)  02/07/22 218 lb (98.9 kg)  01/25/22 219 lb 6.4 oz (99.5 kg)     GEN: Well nourished, well developed in no acute distress NECK: No JVD; No carotid bruits CARDIAC: Regular rate and rhythm, no murmurs, rubs, gallops RESPIRATORY:  Clear to auscultation without rales, wheezing or rhonchi  ABDOMEN: Soft, non-tender, non-distended EXTREMITIES:  No edema; No deformity   ASSESSMENT AND PLAN:    PVCs:  EKG today shows NSR with stable intervals Continue flecainide NSR on flecainide.  High risk medication monitoring.   EF normal.  Continue flecainide 50 mg daily.    Mobitz 1 AV block:  Diltiazem previously stopped due to Mobitz 1 AV block.   If he does have further atrial arrhythmias, may need to restart  this. Would need to follow very closely.  Follow up with Dr. Elberta Fortis in 6 months  Signed, Graciella Freer, PA-C

## 2022-09-14 NOTE — Patient Instructions (Signed)
It was good to see you again today, I will be in touch with your labs as soon as possible  Cologuard is due this coming November, please remind me around that time and I will order a kit for you  Recommend flu shot, COVID booster this fall

## 2022-09-14 NOTE — Patient Instructions (Signed)
Medication Instructions:  Your physician recommends that you continue on your current medications as directed. Please refer to the Current Medication list given to you today.  *If you need a refill on your cardiac medications before your next appointment, please call your pharmacy*  Lab Work: None ordered If you have labs (blood work) drawn today and your tests are completely normal, you will receive your results only by: MyChart Message (if you have MyChart) OR A paper copy in the mail If you have any lab test that is abnormal or we need to change your treatment, we will call you to review the results.  Follow-Up: At [facility], you and your health needs are our priority.  As part of our continuing mission to provide you with exceptional heart care, we have created designated Provider Care Teams.  These Care Teams include your primary Cardiologist (physician) and Advanced Practice Providers (APPs -  Physician Assistants and Nurse Practitioners) who all work together to provide you with the care you need, when you need it.  Your next appointment:   6 month(s)  Provider:   Will Camnitz, MD  

## 2022-09-18 ENCOUNTER — Telehealth: Payer: Self-pay | Admitting: Psychiatry

## 2022-09-18 ENCOUNTER — Encounter: Payer: Self-pay | Admitting: Family Medicine

## 2022-09-18 ENCOUNTER — Ambulatory Visit (INDEPENDENT_AMBULATORY_CARE_PROVIDER_SITE_OTHER): Payer: Managed Care, Other (non HMO) | Admitting: Family Medicine

## 2022-09-18 VITALS — BP 122/84 | HR 64 | Temp 98.4°F | Resp 18 | Ht 72.0 in | Wt 205.6 lb

## 2022-09-18 DIAGNOSIS — Z125 Encounter for screening for malignant neoplasm of prostate: Secondary | ICD-10-CM

## 2022-09-18 DIAGNOSIS — E611 Iron deficiency: Secondary | ICD-10-CM | POA: Diagnosis not present

## 2022-09-18 DIAGNOSIS — Z13220 Encounter for screening for lipoid disorders: Secondary | ICD-10-CM | POA: Diagnosis not present

## 2022-09-18 DIAGNOSIS — Z0000 Encounter for general adult medical examination without abnormal findings: Secondary | ICD-10-CM | POA: Diagnosis not present

## 2022-09-18 DIAGNOSIS — Z131 Encounter for screening for diabetes mellitus: Secondary | ICD-10-CM | POA: Diagnosis not present

## 2022-09-18 DIAGNOSIS — Z9884 Bariatric surgery status: Secondary | ICD-10-CM | POA: Diagnosis not present

## 2022-09-18 DIAGNOSIS — N183 Chronic kidney disease, stage 3 unspecified: Secondary | ICD-10-CM

## 2022-09-18 DIAGNOSIS — Z5181 Encounter for therapeutic drug level monitoring: Secondary | ICD-10-CM | POA: Diagnosis not present

## 2022-09-18 DIAGNOSIS — I10 Essential (primary) hypertension: Secondary | ICD-10-CM

## 2022-09-18 DIAGNOSIS — Z113 Encounter for screening for infections with a predominantly sexual mode of transmission: Secondary | ICD-10-CM

## 2022-09-18 LAB — HEPATIC FUNCTION PANEL
ALT: 23 U/L (ref 0–53)
AST: 18 U/L (ref 0–37)
Albumin: 4.4 g/dL (ref 3.5–5.2)
Alkaline Phosphatase: 91 U/L (ref 39–117)
Bilirubin, Direct: 0.1 mg/dL (ref 0.0–0.3)
Total Bilirubin: 0.8 mg/dL (ref 0.2–1.2)
Total Protein: 6.8 g/dL (ref 6.0–8.3)

## 2022-09-18 LAB — LIPID PANEL
Cholesterol: 161 mg/dL (ref 0–200)
HDL: 44.8 mg/dL (ref >39.00)
LDL Cholesterol: 91 mg/dL (ref 0–99)
NonHDL: 115.89
Total CHOL/HDL Ratio: 4
Triglycerides: 122 mg/dL (ref 0.0–149.0)
VLDL: 24.4 mg/dL (ref 0.0–40.0)

## 2022-09-18 LAB — PSA: PSA: 0.39 ng/mL (ref 0.10–4.00)

## 2022-09-18 LAB — HEMOGLOBIN A1C: Hgb A1c MFr Bld: 4.6 % (ref 4.6–6.5)

## 2022-09-18 LAB — VITAMIN B12: Vitamin B-12: 287 pg/mL (ref 211–911)

## 2022-09-18 LAB — FERRITIN: Ferritin: 53.3 ng/mL (ref 22.0–322.0)

## 2022-09-18 MED ORDER — RIZATRIPTAN BENZOATE 10 MG PO TABS: 10.0000 mg | ORAL_TABLET | ORAL | 6 refills | Status: DC | PRN

## 2022-09-18 NOTE — Telephone Encounter (Signed)
I called patient to discuss. No answer, left a message asking him to call us back. Please route to POD 1.

## 2022-09-18 NOTE — Addendum Note (Signed)
Addended by: Ocie Doyne on: 09/18/2022 04:09 PM   Modules accepted: Orders
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
## 2022-09-19 ENCOUNTER — Ambulatory Visit: Payer: Self-pay | Admitting: Psychiatry

## 2022-09-19 NOTE — Telephone Encounter (Signed)
Called patient and informed him of the of the below.

## 2022-09-26 ENCOUNTER — Ambulatory Visit: Payer: Self-pay | Admitting: Psychiatry

## 2022-09-27 ENCOUNTER — Encounter: Payer: Self-pay | Admitting: Family
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
## 2022-10-12 ENCOUNTER — Encounter: Payer: Self-pay | Admitting: Adult Health

## 2022-10-12 ENCOUNTER — Encounter: Payer: Self-pay | Admitting: Family Medicine

## 2022-10-12 ENCOUNTER — Ambulatory Visit: Payer: Managed Care, Other (non HMO) | Admitting: Adult Health

## 2022-10-26 ENCOUNTER — Ambulatory Visit: Payer: Self-pay | Admitting: Psychiatry

## 2022-12-27 ENCOUNTER — Telehealth: Payer: Self-pay | Admitting: Cardiology

## 2022-12-27 NOTE — Telephone Encounter (Signed)
Pt c/o medication issue:  1. Name of Medication: flecainide (TAMBOCOR) 50 MG tablet   2. How are you currently taking this medication (dosage and times per day)?   Take 1 tablet (50 mg total) by mouth daily.    3. Are you having a reaction (difficulty breathing--STAT)? No   4. What is your medication issue? Pt states that his PCP wants to know how much longer he needs to be on this medication. Please advise

## 2022-12-27 NOTE — Telephone Encounter (Signed)
Spoke with pt and advised Dr Elberta Fortis is not in the office today.  Pt states his PCP was asking if Flecainide would be a long term medication.  Pt states palpitations have been well controlled on medication. Pt advised will forward question to Dr Elberta Fortis for recommendation.  Pt verbalizes understanding and thanked Charity fundraiser for the call.

## 2022-12-28 NOTE — Telephone Encounter (Signed)
Pt made aware, per Dr. Elberta Fortis " If feeling well flecainide will likely be a long term medication  ". Pt appreciates the return call and will update his PCP.

## 2023-01-09 NOTE — Patient Instructions (Incomplete)
It was great to see you again today, happy holidays! We will treat your cough with azithromycin, prednisone, albuterol inhaler as needed.  Please let me know if you are not feeling better in the next few days

## 2023-01-09 NOTE — Progress Notes (Unsigned)
[facility] [address], [address] [place], [place] [address] [phone]  Date:  01/10/2023   Name:  Erik Weber   DOB:  1976-02-06   MRN:  [id number]  PCP:  Pearline Cables, MD    Chief Complaint: cough, congestion (X 1.5 month. He has some wheezing as well. )   History of Present Illness:  Erik Weber is a 47 y.o. very pleasant male patient who presents with the following: Patient seen today with concern of cough and congestion Most recent visit with myself was in August for his physical History of chronic kidney disease, sleep apnea, hypertension, gastric bypass in September 2020, SS trait, flecainide for PVCs per cardiology   He is followed by [facility]rology-[facility] Dr. Servando Salina is his general cardiologist, Dr. Elberta Fortis for electrophysiology  His 56 yo daughter had strep in mid -October and was treated but she did not take all her abx.  - she had about 3 days of amox left over which [name] tried but it did not clear him up He notes her may wheeze at times, may cough up phlegm No fever noted  Besides the cough he generally feels well Never a smoker  No vomiting or diarrhea He does not generally have fall allergies  Flu and covid UTD  Patient Active Problem List   Diagnosis Date Noted   Sickle cell trait (HCC) 05/12/2020   Chronic renal insufficiency    Fracture of base of fifth metacarpal bone of right hand    GERD (gastroesophageal reflux disease)    Hypertension    Morbid obesity (HCC)    Positive TB test    Sleep apnea    Iron deficiency anemia secondary to inadequate dietary iron intake 12/04/2019   CKD (chronic kidney disease) stage 3, GFR 30-59 ml/min (HCC) 11/25/2018   OSA (obstructive sleep apnea) 10/28/2018   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   Pneumonitis 10/04/2018   Obesity 08/18/2013   Tinea versicolor 05/19/2013   Hypertriglyceridemia 05/28/2012   Hypogonadism male 06/07/2011    Essential hypertension 09/21/2009   Disorder resulting from impaired renal function 05/01/2007   GERD 04/04/2007    Past Medical History:  Diagnosis Date   Chronic renal insufficiency    CKD (chronic kidney disease) stage 3, GFR 30-59 ml/min (HCC) 11/25/2018   Managed by [facility] Dr Ronalee Belts.  Secondary to hypertensive nephropathy    Disorder resulting from impaired renal function 05/01/2007   Thought due to hypertension, followed by Dr. Lowell Guitar     Essential hypertension 09/21/2009   Qualifier: Diagnosis of  By: Nena Jordan    Fracture of base of fifth metacarpal bone of right hand    GERD 04/04/2007   Qualifier: Diagnosis of  By: Nena Jordan    GERD (gastroesophageal reflux disease)    hx of    Hypertension    Hypertriglyceridemia 05/28/2012   Hypogonadism male 06/07/2011   Iron deficiency anemia secondary to inadequate dietary iron intake 12/04/2019   Morbid obesity (HCC)    Obesity 08/18/2013   OSA (obstructive sleep apnea) 10/28/2018   Pneumonitis 10/04/2018   after accidental inhalation of swimming pool water during swimmng lesson   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   Sleep apnea    Mild, no cpap needed   Tinea versicolor 05/19/2013    Past Surgical History:  Procedure Laterality Date  ADENOIDECTOMY     childhood   GASTRIC ROUX-EN-Y N/A 10/28/2018   Procedure: LAPAROSCOPIC ROUX-EN-Y GASTRIC BYPASS WITH UPPER ENDOSCOPY;  Surgeon: Gaynelle Adu, MD;  Location: [facility];  Service: General;  Laterality: N/A;   HAND SURGERY Right 02/2016   UPPER GI ENDOSCOPY  03/10/2016   XI ROBOTIC ASSISTED INGUINAL HERNIA REPAIR WITH MESH Left 10/10/2019   Procedure: XI ROBOTIC ASSISTED LEFT INGUINAL HERNIA REPAIR WITH MESH;  Surgeon: Gaynelle Adu, MD;  Location: [facility];  Service: General;  Laterality: Left;    Social History   Tobacco Use   Smoking status: Never   Smokeless tobacco: Never  Vaping Use   Vaping status: Never Used   Substance Use Topics   Alcohol use: No    Alcohol/week: 0.0 standard drinks of alcohol   Drug use: No    Family History  Problem Relation Age of Onset   Kidney cancer Mother 34   Hypertension Father        age 54   Diabetes Father    Lung cancer Paternal Uncle    Colon cancer Neg Hx    Prostate cancer Neg Hx    Liver disease Neg Hx    Esophageal cancer Neg Hx    Stomach cancer Neg Hx    Pancreatic cancer Neg Hx     No Known Allergies  Medication list has been reviewed and updated.  Current Outpatient Medications on File Prior to Visit  Medication Sig Dispense Refill   Cholecalciferol (VITAMIN D-3 PO) Take 5,000 Units by mouth daily.     Ferrous Sulfate (IRON PO) Take 1 tablet by mouth daily.     flecainide (TAMBOCOR) 50 MG tablet Take 1 tablet (50 mg total) by mouth daily. 90 tablet 3   meclizine (ANTIVERT) 25 MG tablet Take 1 tablet (25 mg total) by mouth 3 (three) times daily as needed for dizziness. 30 tablet 6   Multiple Vitamins-Minerals (MULTIVITAMIN WITH MINERALS) tablet Take 1 tablet by mouth daily.     rizatriptan (MAXALT) 10 MG tablet Take 1 tablet (10 mg total) by mouth as needed for migraine. May repeat in 2 hours if needed 10 tablet 6   topiramate (TOPAMAX) 100 MG tablet TAKE 1 TABLET BY MOUTH EVERY DAY 90 tablet 0   No current facility-administered medications on file prior to visit.    Review of Systems:  As per HPI- otherwise negative.   Physical Examination: Vitals:   01/10/23 1447  BP: 120/82  Pulse: (!) 57  Resp: 18  Temp: 97.8 F (36.6 C)  SpO2: 99%   Vitals:   01/10/23 1447  Weight: 204 lb (92.5 kg)  Height: 6' (1.829 m)   Body mass index is 27.67 kg/m. Ideal Body Weight: Weight in (lb) to have BMI = 25: 183.9  GEN: no acute distress. Normal weight, looks well HEENT: Atraumatic, Normocephalic.  Bilateral TM wnl, oropharynx normal.  PEERL,EOMI.   Ears and Nose: No external deformity. CV: RRR, No M/G/R. No JVD. No thrill. No extra  heart sounds. PULM: CTA B, no wheezes, crackles, rhonchi. No retractions. No resp. distress. No accessory muscle use. EXTR: No c/c/e PSYCH: Normally interactive. Conversant.    Assessment and Plan: Subacute cough - Plan: azithromycin (ZITHROMAX) 250 MG tablet, albuterol (VENTOLIN HFA) 108 (90 Base) MCG/ACT inhaler, predniSONE (DELTASONE) 20 MG tablet  Pt seen today with cough for several weeks Try a course of azithromycin and 3 days of prednisone. Albuterol prn He is asked to let me know if not getting  better over the next few days- Sooner if worse.    Covid and flu shots UTD  Signed Abbe Amsterdam, MD

## 2023-01-10 ENCOUNTER — Ambulatory Visit (INDEPENDENT_AMBULATORY_CARE_PROVIDER_SITE_OTHER): Payer: Managed Care, Other (non HMO) | Admitting: Family Medicine

## 2023-01-10 ENCOUNTER — Encounter: Payer: Self-pay | Admitting: Family Medicine

## 2023-01-10 VITALS — BP 120/82 | HR 57 | Temp 97.8°F | Resp 18 | Ht 72.0 in | Wt 204.0 lb

## 2023-01-10 DIAGNOSIS — R052 Subacute cough: Secondary | ICD-10-CM | POA: Diagnosis not present

## 2023-01-10 MED ORDER — ALBUTEROL SULFATE HFA 108 (90 BASE) MCG/ACT IN AERS
2.0000 | INHALATION_SPRAY | Freq: Four times a day (QID) | RESPIRATORY_TRACT | 0 refills | Status: DC | PRN
Start: 2023-01-10 — End: 2023-02-19

## 2023-01-10 MED ORDER — AZITHROMYCIN 250 MG PO TABS
ORAL_TABLET | ORAL | 0 refills | Status: AC
Start: 2023-01-10 — End: 2023-01-15

## 2023-01-10 MED ORDER — PREDNISONE 20 MG PO TABS
ORAL_TABLET | ORAL | 0 refills | Status: DC
Start: 2023-01-10 — End: 2023-01-25

## 2023-01-23 ENCOUNTER — Telehealth: Payer: Self-pay | Admitting: Family Medicine

## 2023-01-23 NOTE — Telephone Encounter (Signed)
Pt called to advise he received a bill from [facility] for 09/18/22. Pt said he's never gotten his a bill from them before. Advised patient that some of the labs he received could be "outside" of a physical lab work up and that is why he is receiving a bill. Pt is aware that the amount of $55.19 and $15 is going towards his deductible but per patient it shouldn't be. Advised patient to send a photo of the bill via mychart so it can be checked for the date of service since he was unsure of the date, he simply said it was the day of his physical. Pt will upload photo and he is going to call [facility] to see if he can get a more detailed statement. Please review and reach out to patient.

## 2023-01-25 ENCOUNTER — Other Ambulatory Visit: Payer: Self-pay | Admitting: Family Medicine

## 2023-01-25 DIAGNOSIS — R052 Subacute cough: Secondary | ICD-10-CM

## 2023-01-25 DIAGNOSIS — Z1211 Encounter for screening for malignant neoplasm of colon: Secondary | ICD-10-CM

## 2023-01-25 MED ORDER — PREDNISONE 20 MG PO TABS
ORAL_TABLET | ORAL | 0 refills | Status: DC
Start: 2023-01-25 — End: 2023-02-19

## 2023-01-25 NOTE — Progress Notes (Signed)
Called patient, he would like a repeat Cologuard kit.  Will send his home  I treated him for cough earlier this month with 3 days of prednisone and a Z-Pak.  He notes he got better, but then about 3 days ago an ongoing cough came back.  He feels like there is "a tickle in my throat".  He does not otherwise feel sick.  I will give him 3 more days of prednisone, he will let me know if this does not resolve this problem

## 2023-02-01 IMAGING — DX DG WRIST 2V*R*
2 series · 2 of 2 positions shown · non-contrast
Comparison: X-ray right wrist 12/25/2020 [DATE] p.m.

CLINICAL DATA: Status post fall.  Bilateral wrist reduction.

EXAM:
RIGHT WRIST - 2 VIEW

[wrist ap]
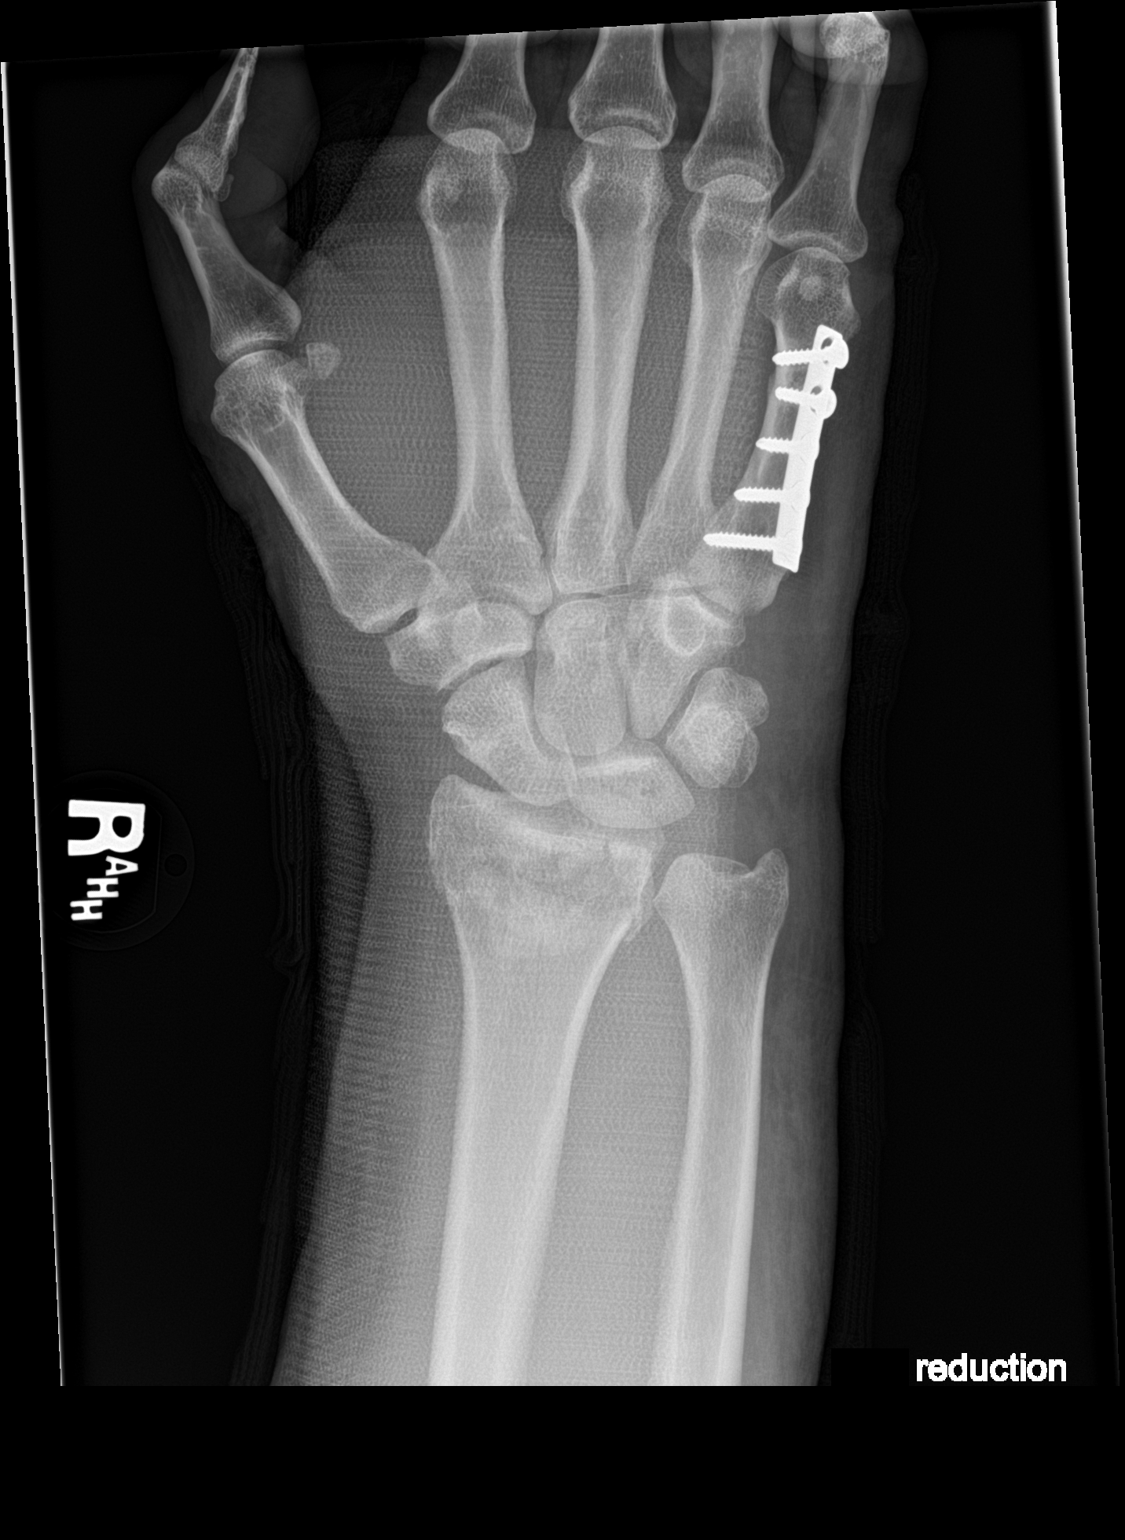

[wrist lat]
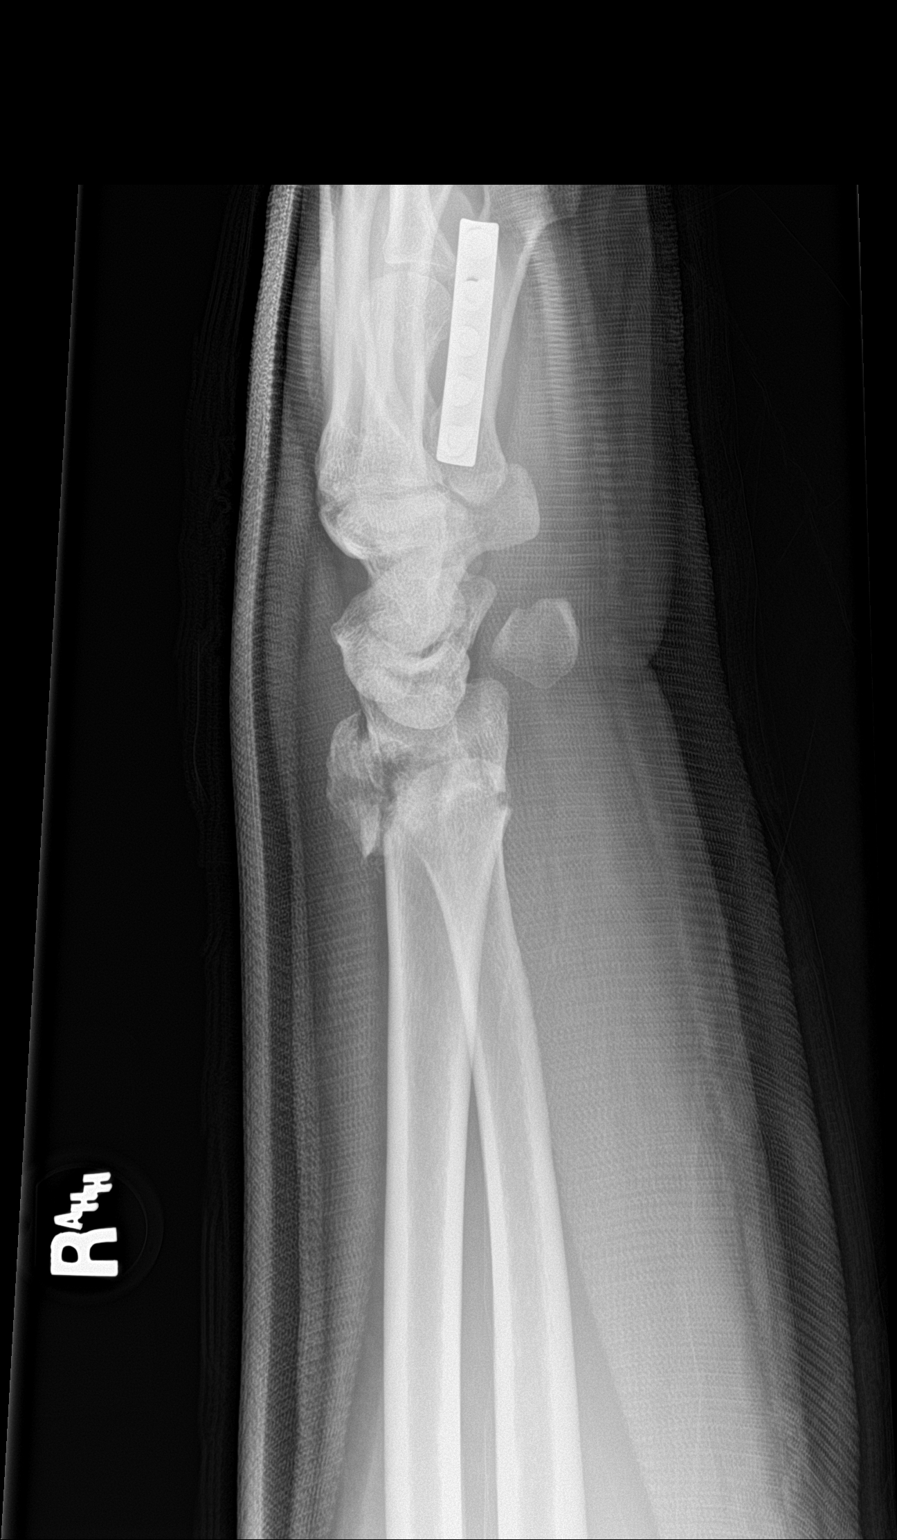

[2 of 2 positions shown; findings below may reference images not displayed]

FINDINGS: Interval cast placement with improved anatomical alignment of a
comminuted intra-articular distal radial fracture with slightly
improved dorsal displacement. No marked persistent angulation.
Previously identified likely triquetral fracture not well visualized
due to positioning. There is no evidence of new fracture or
dislocation. Associated subcutaneus soft tissue edema.
Redemonstration of plate and screw fixation of the fifth metacarpal.
IMPRESSION: 1. Interval cast placement with improved anatomical alignment of a
comminuted intra-articular distal radial fracture with slightly
improved dorsal displacement.
2. Previously identified triquetral fracture not visualized due to
positioning.

## 2023-02-01 IMAGING — CT CT CERVICAL SPINE W/O CM
3 of 4 series · 13 of 33 positions shown, 16 images · non-contrast
Comparison: None.

CLINICAL DATA: Fall from ladder.

EXAM:
CT HEAD WITHOUT CONTRAST
CT CERVICAL SPINE WITHOUT CONTRAST
TECHNIQUE: Multidetector CT imaging of the head and cervical spine was
performed following the standard protocol without intravenous
contrast. Multiplanar CT image reconstructions of the cervical spine
were also generated.

[Series 3: c_spine 2.0 i30s 3 · axial · 0.34mm/px · z∈[-536,-432]mm · 5 of 80 slices shown, 7 images]
[im 14/80  soft-tissue]
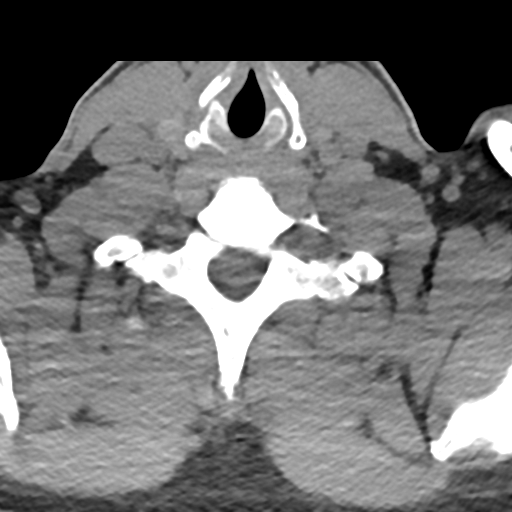
[im 14/80  bone]
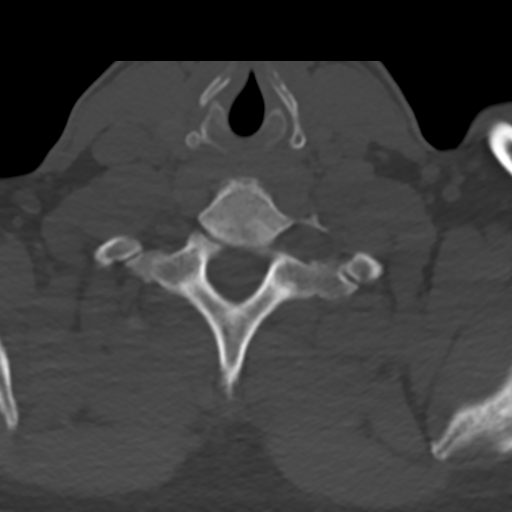
[im 27/80  bone]
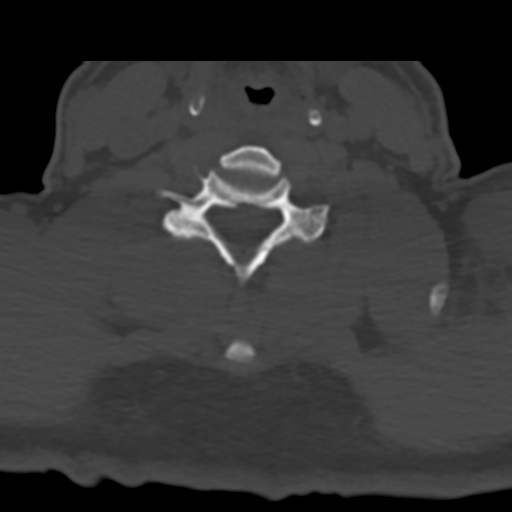
[im 40/80  bone]
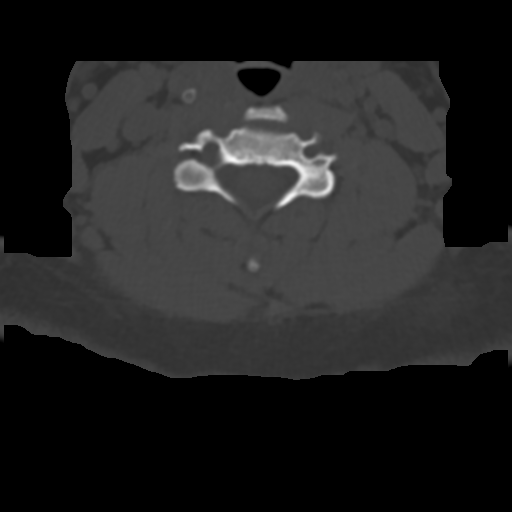
[im 53/80  bone]
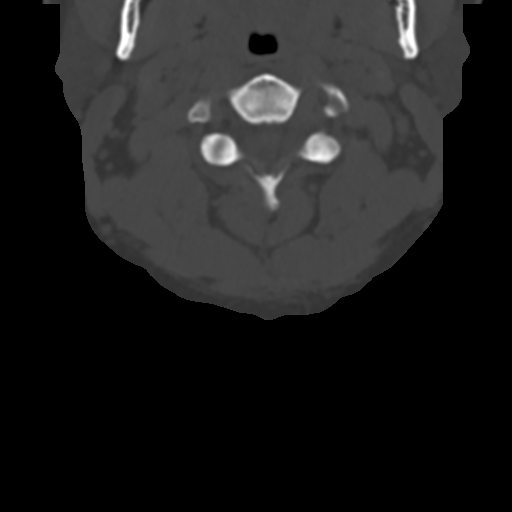
[im 66/80  soft-tissue]
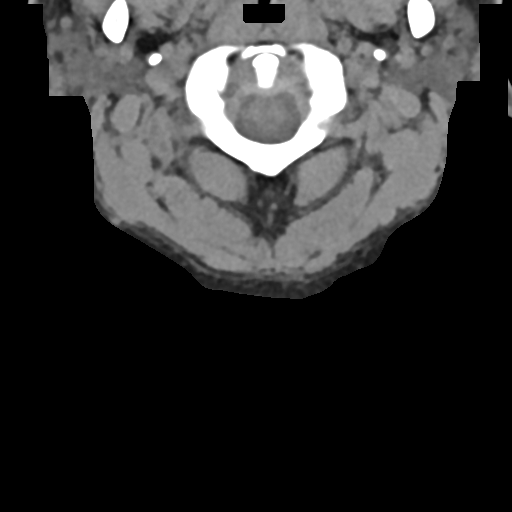
[im 66/80  bone]
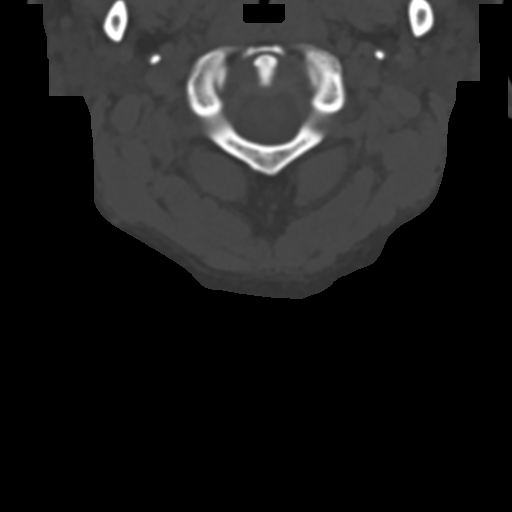

[Series 5: coronals · coronal · 0.23mm/px · 3 of 47 slices shown]
[im 10/47  bone]
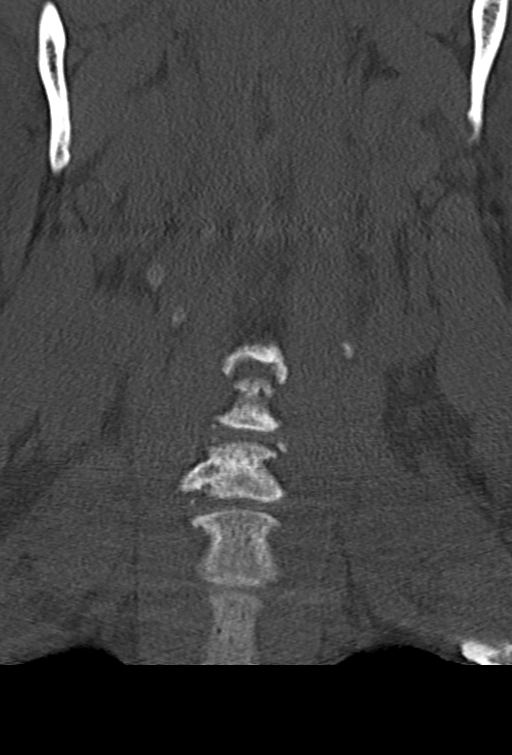
[im 19/47  bone]
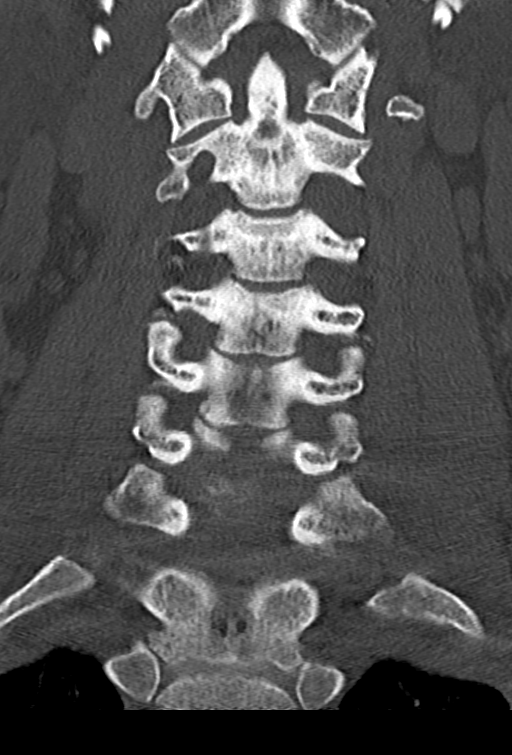
[im 28/47  bone]
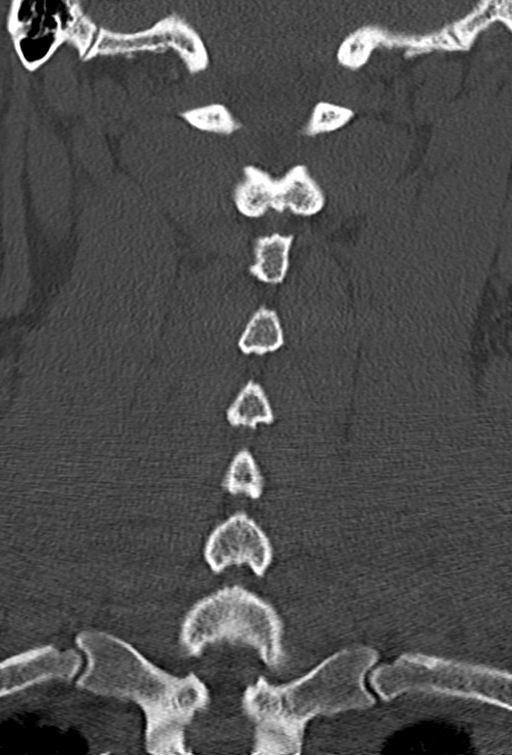

[Series 6: sagittals · sagittal · 0.32mm/px · 5 of 60 slices shown, 6 images]
[im 20/60  bone]
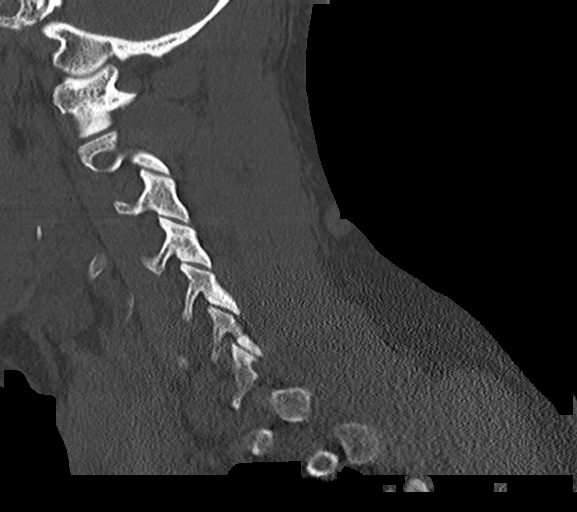
[im 25/60  bone]
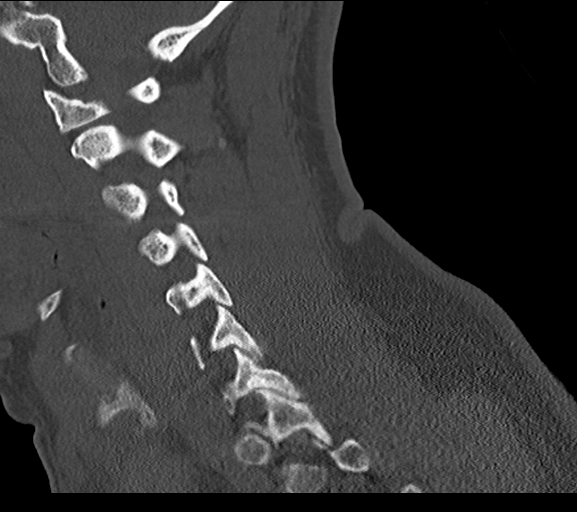
[im 30/60  soft-tissue]
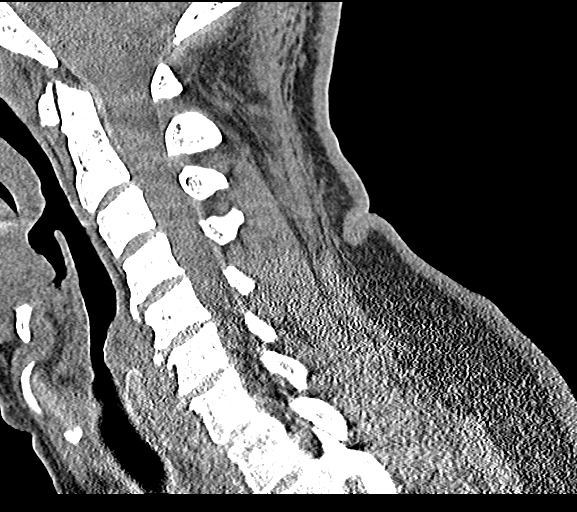
[im 30/60  bone]
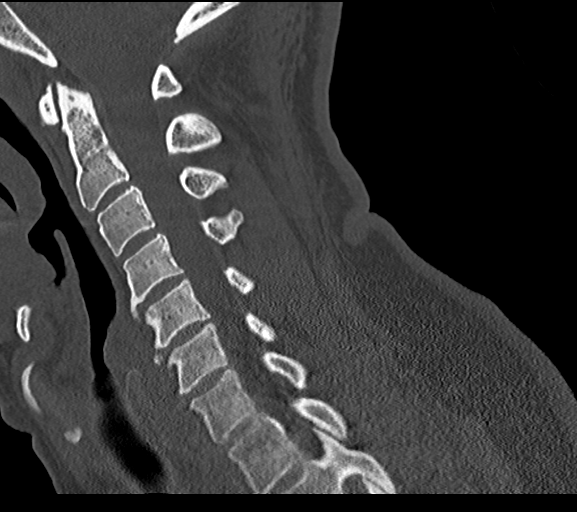
[im 35/60  bone]
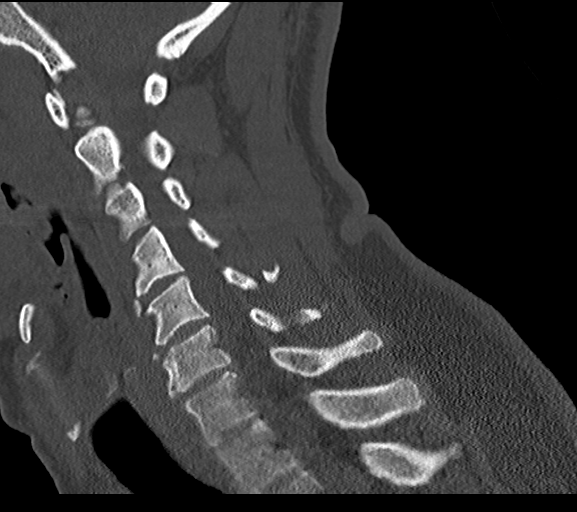
[im 40/60  bone]
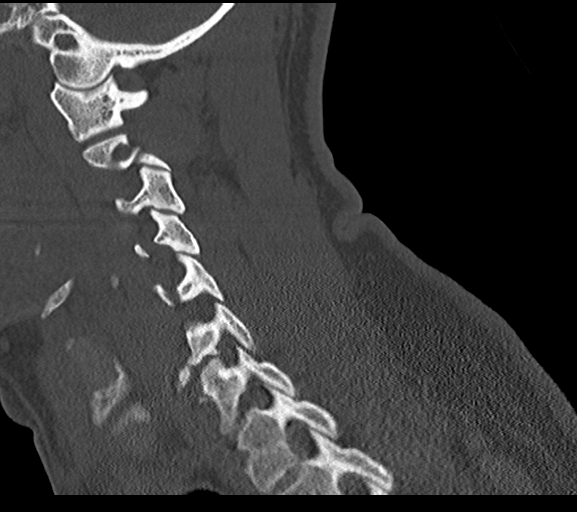

[13 of 33 positions shown; findings below may reference images not displayed]

FINDINGS: CT HEAD FINDINGS

Brain: No evidence of acute infarction, hemorrhage, hydrocephalus,
extra-axial collection or mass lesion/mass effect.

Vascular: No hyperdense vessel or unexpected calcification.

Skull: Normal. Negative for fracture or focal lesion.

Sinuses/Orbits: No acute finding.

Other: None.

CT CERVICAL SPINE FINDINGS

Alignment: Normal.

Skull base and vertebrae: No acute fracture. No primary bone lesion
or focal pathologic process.

Soft tissues and spinal canal: No prevertebral fluid or swelling. No
visible canal hematoma.

Disc levels: Degenerative disc disease with multilevel anterior
osteophytes.

Upper chest: Negative.

Other: No other abnormalities.
IMPRESSION: 1. No acute intracranial abnormalities.
2. No fracture or traumatic malalignment in the cervical spine.

## 2023-02-01 IMAGING — DX DG WRIST 2V*L*
2 series · 2 of 2 positions shown · non-contrast
Comparison: X-ray left wrist 12/25/2020 [DATE] p.m.

CLINICAL DATA: Status post fall.  Bilateral breast reductions.

EXAM:
LEFT WRIST - 2 VIEW

[wrist ap]
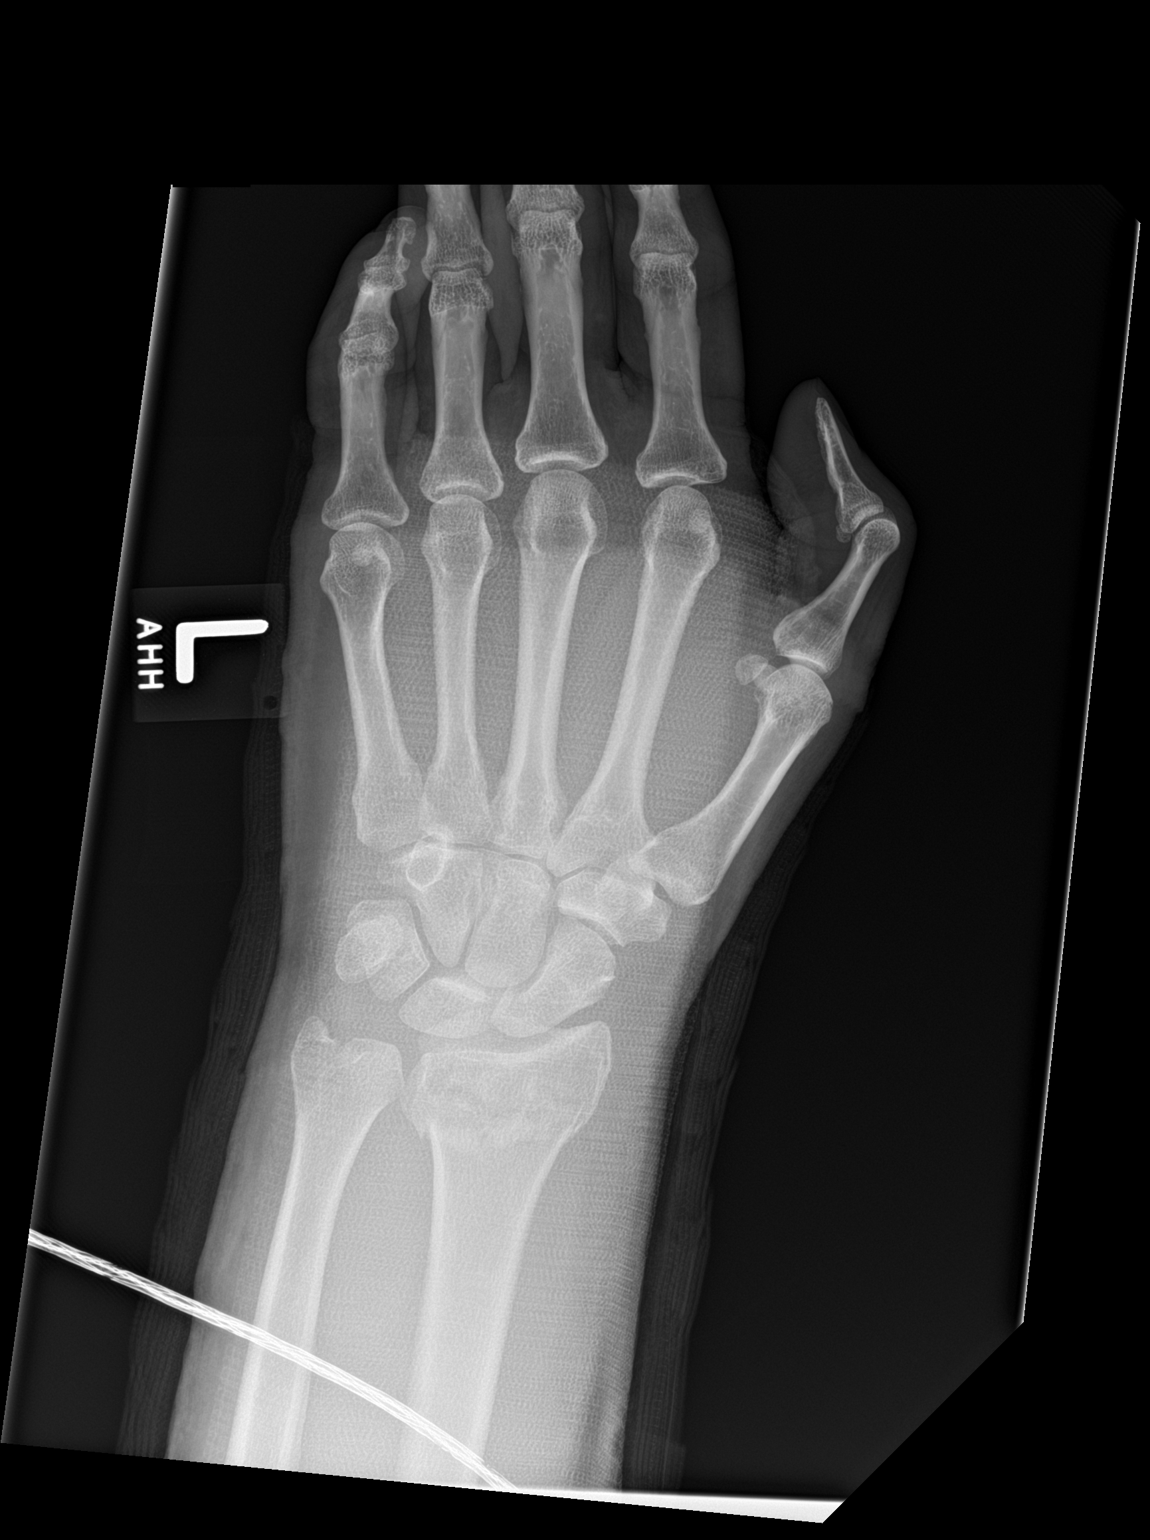

[wrist lat]
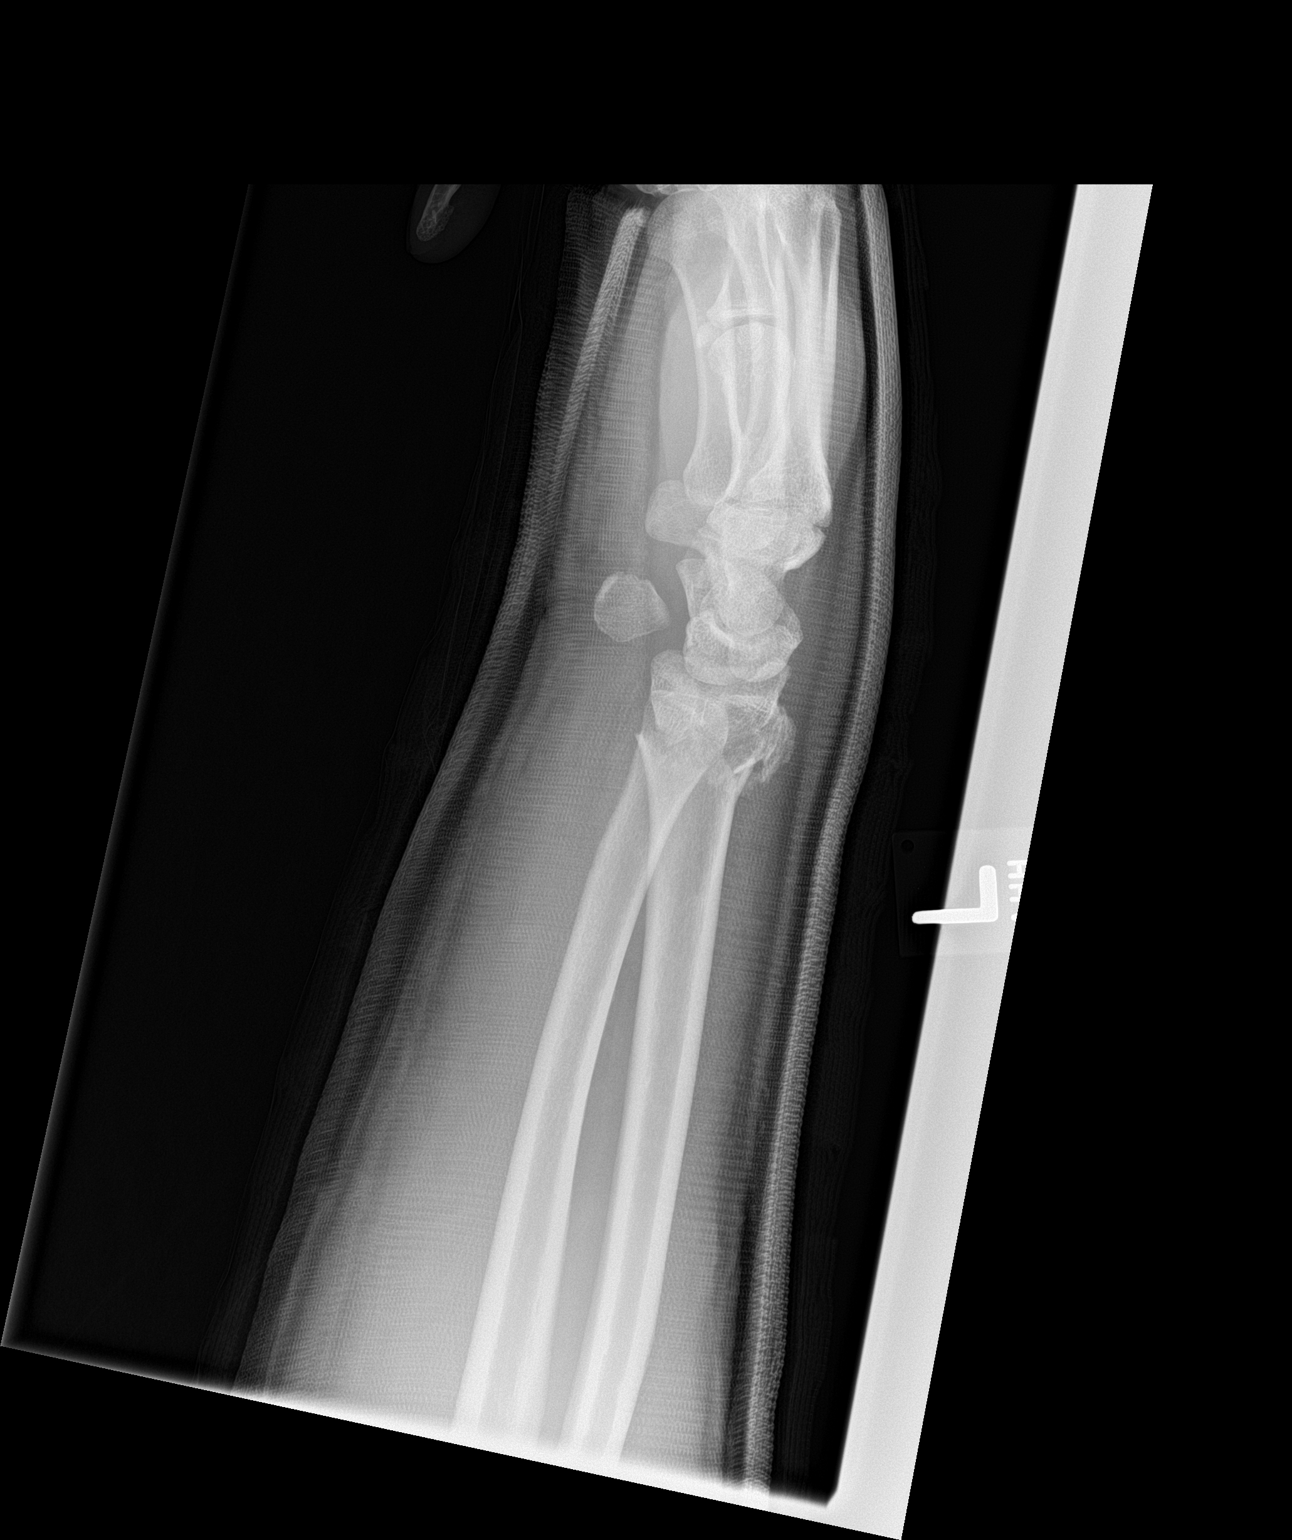

[2 of 2 positions shown; findings below may reference images not displayed]

FINDINGS: Interval cast placement with improved anatomical alignment of a
comminuted intra-articular distal left radial fracture with
persistent mild dorsal angulation and displacement. Known ulnar
styloid fracture not well visualized due to improved alignment.
Dislocation of the pisiform. No other acute displaced fracture or
dislocation identified. Associated subcutaneus soft tissue edema.
IMPRESSION: 1. Interval cast placement with improved anatomical alignment of a
comminuted intra-articular distal left radial fracture with
persistent mild dorsal angulation and displacement.
2. Known ulnar styloid fracture not well visualized due to improved
alignment.
3. Dislocation of the pisiform.

## 2023-02-06 ENCOUNTER — Encounter: Payer: Self-pay | Admitting: Family Medicine

## 2023-02-06 LAB — COLOGUARD: COLOGUARD: NEGATIVE

## 2023-02-13 ENCOUNTER — Encounter: Payer: Self-pay | Admitting: Cardiology

## 2023-02-15 ENCOUNTER — Telehealth: Payer: Self-pay

## 2023-02-15 ENCOUNTER — Telehealth: Payer: Self-pay | Admitting: Cardiology

## 2023-02-15 NOTE — Telephone Encounter (Signed)
   Name: Erik Weber  DOB: August 22, 1975  MRN: [id number]  Primary Cardiologist: Kardie Tobb, DO   Preoperative team, please contact this patient and set up a phone call appointment for further preoperative risk assessment. Please obtain consent and complete medication review. Thank you for your help.  I confirm that guidance regarding antiplatelet and oral anticoagulation therapy has been completed and, if necessary, noted below.  None requested.   I also confirmed the patient resides in the state of [place] . As per [facility] Medical Board telemedicine laws, the patient must reside in the state in which the provider is licensed.   Barnie Hila, NP 02/15/2023, 1:10 PM [facility]

## 2023-02-15 NOTE — Telephone Encounter (Signed)
 Called and spoke to patient to schedule telephone visit for preop clearance patient is scheduled for 1/20 patient voiced understanding.

## 2023-02-15 NOTE — Telephone Encounter (Signed)
 Patient called back to schedule telephone visit for preop clearance patient is scheduled for 1/20 patient voiced understanding med rec and consent completed.     Patient Consent for Virtual Visit         Erik Weber has provided verbal consent on 02/15/2023 for a virtual visit (video or telephone).   CONSENT FOR VIRTUAL VISIT FOR:  Erik Weber  By participating in this virtual visit I agree to the following:  I hereby voluntarily request, consent and authorize [facility] and its employed or contracted physicians, physician assistants, nurse practitioners or other licensed health care professionals (the Practitioner), to provide me with telemedicine health care services (the "Services) as deemed necessary by the treating Practitioner. I acknowledge and consent to receive the Services by the Practitioner via telemedicine. I understand that the telemedicine visit will involve communicating with the Practitioner through live audiovisual communication technology and the disclosure of certain medical information by electronic transmission. I acknowledge that I have been given the opportunity to request an in-person assessment or other available alternative prior to the telemedicine visit and am voluntarily participating in the telemedicine visit.  I understand that I have the right to withhold or withdraw my consent to the use of telemedicine in the course of my care at any time, without affecting my right to future care or treatment, and that the Practitioner or I may terminate the telemedicine visit at any time. I understand that I have the right to inspect all information obtained and/or recorded in the course of the telemedicine visit and may receive copies of available information for a reasonable fee.  I understand that some of the potential risks of receiving the Services via telemedicine include:  Delay or interruption in medical evaluation due to technological equipment failure  or disruption; Information transmitted may not be sufficient (e.g. poor resolution of images) to allow for appropriate medical decision making by the Practitioner; and/or  In rare instances, security protocols could fail, causing a breach of personal health information.  Furthermore, I acknowledge that it is my responsibility to provide information about my medical history, conditions and care that is complete and accurate to the best of my ability. I acknowledge that Practitioner's advice, recommendations, and/or decision may be based on factors not within their control, such as incomplete or inaccurate data provided by me or distortions of diagnostic images or specimens that may result from electronic transmissions. I understand that the practice of medicine is not an exact science and that Practitioner makes no warranties or guarantees regarding treatment outcomes. I acknowledge that a copy of this consent can be made available to me via my patient portal [facility] MyChart), or I can request a printed copy by calling the office of [facility].    I understand that my insurance will be billed for this visit.   I have read or had this consent read to me. I understand the contents of this consent, which adequately explains the benefits and risks of the Services being provided via telemedicine.  I have been provided ample opportunity to ask questions regarding this consent and the Services and have had my questions answered to my satisfaction. I give my informed consent for the services to be provided through the use of telemedicine in my medical care

## 2023-02-15 NOTE — Telephone Encounter (Signed)
 Tried calling patient to schedule telephone visit for preop clearance no answer left a vm to call back to schedule.

## 2023-02-15 NOTE — Telephone Encounter (Signed)
 Patient is calling to get update on surgical clearance form sent via MyChart. Requesting call back to get update on this today. Please advise.

## 2023-02-15 NOTE — Telephone Encounter (Signed)
   Pre-operative Risk Assessment    Patient Name: Erik Weber  DOB: 1975/02/14 MRN: [id number]   Date of last office visit: 09/14/22 Date of next office visit: none  Request for Surgical Clearance    Procedure:  Liposuction 360 with fat transfer to buttocks (BBL), Abdominal Etching   Date of Surgery:  Clearance 03/12/23                                 Surgeon:  Dr. Wellington Kirsch Surgeon's Group or Practice Name:  [facility]  Phone number:  [phone] Fax number:  [phone]   Type of Clearance Requested:   - Medical    Type of Anesthesia:  Not Indicated   Additional requests/questions:    SignedConnye GORMAN Hoit   02/15/2023, 12:57 PM

## 2023-02-15 NOTE — Telephone Encounter (Signed)
 Pre-op team,  I do not see a request for surgical clearance in the chart. Will you please contact the patient for more information.   Thank  you!  DW

## 2023-02-16 NOTE — Telephone Encounter (Signed)
 Good morning Erik Weber,   I am following up with you to let you know that we have not received any type of clearance request for your procedure. Please have the doctors office that is doing your procedure that they will need to fax over a clearance request ASAP to our office fax# [phone] attn: preop team. I see that we have you scheduled for a telephone appt for the pre op clearance 02/26/23. We will need to have the clearance request ASAP in time for the telephone appt. I appreciate your help in this matter.    I am also happy to reach out to the doctors office on your behalf, if you will let me know who is doing your procedure.  Thank you  [facility] PREOP TEAM

## 2023-02-16 NOTE — Telephone Encounter (Signed)
 See clearance request in the chart

## 2023-02-17 NOTE — Progress Notes (Addendum)
 [facility] [address], [address] [place], [place] [address] [phone]  Date:  02/19/2023   Name:  Erik Weber   DOB:  11-13-1975   MRN:  [id number]  PCP:  Watt Harlene BROCKS, MD    Chief Complaint: Pre-op Exam (Concerns/ questions: none/Sees card/Flu shot today: received around 10/2022/Pna20: due)   History of Present Illness:  Erik Weber is a 48 y.o. very pleasant male patient who presents with the following:  Patient seen today for follow-up and a preoperative visit I saw him most recently in early December for cough History of chronic kidney disease, sleep apnea, hypertension, gastric bypass in September 2020, SS trait, flecainide  for PVCs per cardiology, sickle cell trait   He is followed by nephrology-[facility] Dr. Sheena is his general cardiologist, Dr. Inocencio for electrophysiology  He plans to have excess skin removed following weight loss-operation is scheduled for 03/13/23 Cardiology plans to do a preoperative visit for him as well-he is on scheduled for a phone visit on the 20th Surgery will be done by Dr Wellington Elder at [facility] in [place] They have requested a battery of blood work as well as a chest x-ray and EKG-EKG done in August so up-to-date He has so far just done a virtual consultation, he would do an in person consultation the day prior to surgery He will stay in [place] for one week after his surgery while he recovers  Fax [phone]  Flu vaccine done  Covid updated in September  He does not use tobacco or alcohol.  He notes he is able to do a vigorous workout at [facility] 4-5 times a week with no chest pain or difficulty breathing    Lab Results  Component Value Date   HGBA1C 4.6 09/18/2022     Patient Active Problem List   Diagnosis Date Noted   Sickle cell trait (HCC) 05/12/2020   Chronic renal insufficiency    Fracture of base of fifth  metacarpal bone of right hand    GERD (gastroesophageal reflux disease)    Hypertension    Morbid obesity (HCC)    Positive TB test    Sleep apnea    Iron  deficiency anemia secondary to inadequate dietary iron  intake 12/04/2019   CKD (chronic kidney disease) stage 3, GFR 30-59 ml/min (HCC) 11/25/2018   OSA (obstructive sleep apnea) 10/28/2018   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  Secondary to hypertensive nephropathy    Disorder resulting from impaired renal function 05/01/2007   Thought due to hypertension, followed by Dr. Perri     Essential hypertension 09/21/2009   Qualifier: Diagnosis of  By: Georgian ROSALEA CHARM Lamar    Fracture of base of fifth metacarpal bone of right hand    GERD 04/04/2007   Qualifier: Diagnosis of  By: Georgian ROSALEA CHARM Lamar    GERD (gastroesophageal reflux disease)    hx of    Hypertension    Hypertriglyceridemia 05/28/2012   Hypogonadism male 06/07/2011  Iron  deficiency anemia secondary to inadequate dietary iron  intake 12/04/2019   Morbid obesity (HCC)    Obesity 08/18/2013   OSA (obstructive sleep apnea) 10/28/2018   Pneumonitis 10/04/2018   after accidental inhalation of swimming pool water  during swimmng lesson   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   Sleep apnea    Mild, no cpap needed   Tinea versicolor 05/19/2013    Past Surgical History:  Procedure Laterality Date   ADENOIDECTOMY     childhood   GASTRIC ROUX-EN-Y N/A 10/28/2018   Procedure: LAPAROSCOPIC ROUX-EN-Y GASTRIC BYPASS WITH  UPPER ENDOSCOPY;  Surgeon: Tanda Locus, MD;  Location: [facility];  Service: General;  Laterality: N/A;   HAND SURGERY Right 02/2016   UPPER GI ENDOSCOPY  03/10/2016   XI ROBOTIC ASSISTED INGUINAL HERNIA REPAIR WITH MESH Left 10/10/2019   Procedure: XI ROBOTIC ASSISTED LEFT INGUINAL HERNIA REPAIR WITH MESH;  Surgeon: Tanda Locus, MD;  Location: [facility];  Service: General;  Laterality: Left;    Social History   Tobacco Use   Smoking status: Never   Smokeless tobacco: Never  Vaping Use   Vaping status: Never Used  Substance Use Topics   Alcohol use: No    Alcohol/week: 0.0 standard drinks of alcohol   Drug use: No    Family History  Problem Relation Age of Onset   Kidney cancer Mother 70   Hypertension Father        age 71   Diabetes Father    Lung cancer Paternal Uncle    Colon cancer Neg Hx    Prostate cancer Neg Hx    Liver disease Neg Hx    Esophageal cancer Neg Hx    Stomach cancer Neg Hx    Pancreatic cancer Neg Hx     No Known Allergies  Medication list has been reviewed and updated.  Current Outpatient Medications on File Prior to Visit  Medication Sig Dispense Refill   Ferrous Sulfate (IRON  PO) Take by mouth.     flecainide  (TAMBOCOR ) 50 MG tablet Take 1 tablet (50 mg total) by mouth daily. 90 tablet 3   Multiple Vitamins-Minerals (MULTIVITAMIN WITH MINERALS) tablet Take 1 tablet by mouth daily.     topiramate  (TOPAMAX ) 100 MG tablet TAKE 1 TABLET BY MOUTH EVERY DAY 90 tablet 0   VITAMIN D , CHOLECALCIFEROL, PO Take by mouth.     No current facility-administered medications on file prior to visit.    Review of Systems:  As per HPI- otherwise negative.   Physical Examination: Vitals:   02/19/23 0846  BP: 122/60  Pulse: 63  Resp: 18  Temp: 98.1 F (36.7 C)  SpO2: 98%   Vitals:   02/19/23 0846  Weight: 203 lb 3.2 oz (92.2 kg)  Height: 6' (1.829 m)   Body mass index is 27.56 kg/m. Ideal Body Weight: Weight in (lb) to have BMI = 25: 183.9  GEN: no  acute distress.  Looks well HEENT: Atraumatic, Normocephalic.  Ears and Nose: No external deformity. CV: RRR, No M/G/R. No JVD. No thrill. No extra heart sounds. PULM: CTA B, no wheezes, crackles, rhonchi. No retractions. No resp. distress. No accessory muscle use. ABD: S, NT, ND, +BS. No rebound. No HSM. EXTR: No c/c/e PSYCH: Normally interactive. Conversant.  Results for orders placed or performed in visit on 02/19/23  POCT urinalysis dipstick   Collection Time: 02/19/23  9:28 AM  Result Value Ref Range   Color,  UA yellow yellow   Clarity, UA clear clear   Glucose, UA negative negative mg/dL   Bilirubin, UA negative negative   Ketones, POC UA negative negative mg/dL   Spec Grav, UA <=8.994 (A) 1.010 - 1.025   Blood, UA negative negative   pH, UA 7.5 5.0 - 8.0   Protein Ur, POC negative negative mg/dL   Urobilinogen, UA 0.2 0.2 or 1.0 E.U./dL   Nitrite, UA Negative Negative   Leukocytes, UA Trace (A) Negative      Assessment and Plan: Pre-operative exam - Plan: CBC with Differential/Platelet, Comprehensive metabolic panel, Hemoglobin A1c, HIV Antibody (routine testing w rflx), Protime-INR, T4F+T3Free, TSH, Sickle Cell Scr, POCT urinalysis dipstick, DG Chest 2 View  Leukocytes in urine - Plan: Urine Culture Patient seen today for preoperative evaluation.  He also has a cardiology clearance plan for later this month.  He is feeling well, excellent exercise tolerance Await lab work, assuming normal will clear for surgery from my perspective Will need to fax labs, EKG, CXR, and letter of clearance to Dr Georgene Signed Harlene Schroeder, MD  DG Chest 2 View Result Date: 02/19/2023 CLINICAL DATA:  Preop chest radiograph. EXAM: CHEST - 2 VIEW COMPARISON:  Chest radiograph dated 04/09/2018. FINDINGS: The heart size and mediastinal contours are within normal limits. Both lungs are clear. The visualized skeletal structures are unremarkable. IMPRESSION: No active cardiopulmonary disease.  Electronically Signed   By: Vanetta Chou M.D.   On: 02/19/2023 09:48   Addnd 1/15-received labs as below, patient is cleared for planned operation from my perspective.  Will fax notes to plastic surgeon Please note patient does have sickle cell trait.  Also, patient has a cardiology appointment pending for surgical clearance  Results for orders placed or performed in visit on 02/19/23  CBC with Differential/Platelet   Collection Time: 02/19/23  9:07 AM  Result Value Ref Range   WBC 7.2 3.4 - 10.8 x10E3/uL   RBC 5.19 4.14 - 5.80 x10E6/uL   Hemoglobin 15.4 13.0 - 17.7 g/dL   Hematocrit 52.7 62.4 - 51.0 %   MCV 91 79 - 97 fL   MCH 29.7 26.6 - 33.0 pg   MCHC 32.6 31.5 - 35.7 g/dL   RDW 88.2 88.3 - 84.5 %   Platelets 284 150 - 450 x10E3/uL   Neutrophils 47 Not Estab. %   Lymphs 42 Not Estab. %   Monocytes 8 Not Estab. %   Eos 2 Not Estab. %   Basos 1 Not Estab. %   Neutrophils Absolute 3.5 1.4 - 7.0 x10E3/uL   Lymphocytes Absolute 3.0 0.7 - 3.1 x10E3/uL   Monocytes Absolute 0.5 0.1 - 0.9 x10E3/uL   EOS (ABSOLUTE) 0.1 0.0 - 0.4 x10E3/uL   Basophils Absolute 0.1 0.0 - 0.2 x10E3/uL   Immature Granulocytes 0 Not Estab. %   Immature Grans (Abs) 0.0 0.0 - 0.1 x10E3/uL  Comprehensive metabolic panel   Collection Time: 02/19/23  9:07 AM  Result Value Ref Range   Glucose 80 70 - 99 mg/dL   BUN 10 6 - 24 mg/dL   Creatinine, Ser 8.55 (H) 0.76 - 1.27 mg/dL   eGFR 60 >40 fO/fpw/8.26   BUN/Creatinine Ratio 7 (L) 9 - 20   Sodium 141 134 - 144 mmol/L   Potassium 4.2 3.5 - 5.2 mmol/L   Chloride 105 96 - 106 mmol/L   CO2 22 20 - 29 mmol/L   Calcium  9.6 8.7 - 10.2 mg/dL   Total Protein 7.2 6.0 - 8.5 g/dL  Albumin 4.5 4.1 - 5.1 g/dL   Globulin, Total 2.7 1.5 - 4.5 g/dL   Bilirubin Total 0.8 0.0 - 1.2 mg/dL   Alkaline Phosphatase 113 44 - 121 IU/L   AST 27 0 - 40 IU/L   ALT 28 0 - 44 IU/L  Hemoglobin A1c   Collection Time: 02/19/23  9:07 AM  Result Value Ref Range   Hgb A1c MFr Bld  4.7 (L) 4.8 - 5.6 %   Est. average glucose Bld gHb Est-mCnc 88 mg/dL  HIV Antibody (routine testing w rflx)   Collection Time: 02/19/23  9:07 AM  Result Value Ref Range   HIV Screen 4th Generation wRfx Non Reactive Non Reactive  Protime-INR   Collection Time: 02/19/23  9:07 AM  Result Value Ref Range   INR 1.0 0.9 - 1.2   Prothrombin Time 11.2 9.1 - 12.0 sec  T4F+T3Free   Collection Time: 02/19/23  9:07 AM  Result Value Ref Range   Thyroxine (T4) WILL FOLLOW    Free T-3 WILL FOLLOW    Triiodothyronine (T-3), Serum WILL FOLLOW    Free Thyroxine WILL FOLLOW   TSH   Collection Time: 02/19/23  9:07 AM  Result Value Ref Range   TSH 0.963 0.450 - 4.500 uIU/mL  Sickle Cell Scr   Collection Time: 02/19/23  9:07 AM  Result Value Ref Range   Sickle Cell Screen Positive (A) Negative  POCT urinalysis dipstick   Collection Time: 02/19/23  9:28 AM  Result Value Ref Range   Color, UA yellow yellow   Clarity, UA clear clear   Glucose, UA negative negative mg/dL   Bilirubin, UA negative negative   Ketones, POC UA negative negative mg/dL   Spec Grav, UA <=8.994 (A) 1.010 - 1.025   Blood, UA negative negative   pH, UA 7.5 5.0 - 8.0   Protein Ur, POC negative negative mg/dL   Urobilinogen, UA 0.2 0.2 or 1.0 E.U./dL   Nitrite, UA Negative Negative   Leukocytes, UA Trace (A) Negative  Urine Culture   Collection Time: 02/19/23 10:14 AM   Specimen: Urine  Result Value Ref Range   MICRO NUMBER: [id number]    SPECIMEN QUALITY: Adequate    Sample Source NOT GIVEN    STATUS: FINAL    Result:      Less than 10,000 CFU/mL of single Gram positive organism isolated. No further testing will be performed. If clinically indicated, recollection using a method to minimize contamination, with prompt transfer to Urine Culture Transport Tube, is recommended.

## 2023-02-19 ENCOUNTER — Ambulatory Visit (INDEPENDENT_AMBULATORY_CARE_PROVIDER_SITE_OTHER): Payer: Managed Care, Other (non HMO) | Admitting: Family Medicine

## 2023-02-19 ENCOUNTER — Ambulatory Visit (HOSPITAL_BASED_OUTPATIENT_CLINIC_OR_DEPARTMENT_OTHER)
Admission: RE | Admit: 2023-02-19 | Discharge: 2023-02-19 | Disposition: A | Discharge status: Home / Self Care | Payer: Managed Care, Other (non HMO) | Source: Ambulatory Visit | Attending: Family Medicine | Admitting: Family Medicine

## 2023-02-19 VITALS — BP 122/60 | HR 63 | Temp 98.1°F | Resp 18 | Ht 72.0 in | Wt 203.2 lb

## 2023-02-19 DIAGNOSIS — Z01818 Encounter for other preprocedural examination: Secondary | ICD-10-CM

## 2023-02-19 DIAGNOSIS — R82998 Other abnormal findings in urine: Secondary | ICD-10-CM

## 2023-02-19 LAB — POCT URINALYSIS DIP (MANUAL ENTRY)
Bilirubin, UA: NEGATIVE
Blood, UA: NEGATIVE
Glucose, UA: NEGATIVE mg/dL
Ketones, POC UA: NEGATIVE mg/dL
Nitrite, UA: NEGATIVE
Protein Ur, POC: NEGATIVE mg/dL
Spec Grav, UA: <=1.005 — AB (ref 1.010–1.025)
Urobilinogen, UA: 0.2 U/dL
pH, UA: 7.5 (ref 5.0–8.0)

## 2023-02-19 NOTE — Patient Instructions (Addendum)
 Good to see you today- best of luck with your upcoming operation!  I will send results to Dr Cleatrice Burke when they come in

## 2023-02-20 LAB — URINE CULTURE
MICRO NUMBER:: 15946679
SPECIMEN QUALITY:: ADEQUATE

## 2023-02-21 ENCOUNTER — Encounter: Payer: Self-pay | Admitting: Family Medicine

## 2023-02-26 ENCOUNTER — Ambulatory Visit: Payer: Managed Care, Other (non HMO)

## 2023-02-27 LAB — T4F+T3FREE
Free T-3: 3.1 pg/mL
Free Thyroxine: 1.31 ng/dL
Thyroxine (T4): 10.6 ug/dL
Triiodothyronine (T-3), Serum: 114 ng/dL

## 2023-02-27 LAB — CBC WITH DIFFERENTIAL/PLATELET
Basophils Absolute: 0.1 10*3/uL (ref 0.0–0.2)
Basos: 1 %
EOS (ABSOLUTE): 0.1 10*3/uL (ref 0.0–0.4)
Eos: 2 %
Hematocrit: 47.2 % (ref 37.5–51.0)
Hemoglobin: 15.4 g/dL (ref 13.0–17.7)
Immature Grans (Abs): 0 10*3/uL (ref 0.0–0.1)
Immature Granulocytes: 0 %
Lymphocytes Absolute: 3 10*3/uL (ref 0.7–3.1)
Lymphs: 42 %
MCH: 29.7 pg (ref 26.6–33.0)
MCHC: 32.6 g/dL (ref 31.5–35.7)
MCV: 91 fL (ref 79–97)
Monocytes Absolute: 0.5 10*3/uL (ref 0.1–0.9)
Monocytes: 8 %
Neutrophils Absolute: 3.5 10*3/uL (ref 1.4–7.0)
Neutrophils: 47 %
Platelets: 284 10*3/uL (ref 150–450)
RBC: 5.19 x10E6/uL (ref 4.14–5.80)
RDW: 11.7 % (ref 11.6–15.4)
WBC: 7.2 10*3/uL (ref 3.4–10.8)

## 2023-02-27 LAB — COMPREHENSIVE METABOLIC PANEL
ALT: 28 [IU]/L (ref 0–44)
AST: 27 [IU]/L (ref 0–40)
Albumin: 4.5 g/dL (ref 4.1–5.1)
Alkaline Phosphatase: 113 [IU]/L (ref 44–121)
BUN/Creatinine Ratio: 7 — ABNORMAL LOW (ref 9–20)
BUN: 10 mg/dL (ref 6–24)
Bilirubin Total: 0.8 mg/dL (ref 0.0–1.2)
CO2: 22 mmol/L (ref 20–29)
Calcium: 9.6 mg/dL (ref 8.7–10.2)
Chloride: 105 mmol/L (ref 96–106)
Creatinine, Ser: 1.44 mg/dL — ABNORMAL HIGH (ref 0.76–1.27)
Globulin, Total: 2.7 g/dL (ref 1.5–4.5)
Glucose: 80 mg/dL (ref 70–99)
Potassium: 4.2 mmol/L (ref 3.5–5.2)
Sodium: 141 mmol/L (ref 134–144)
Total Protein: 7.2 g/dL (ref 6.0–8.5)
eGFR: 60 mL/min/{1.73_m2} (ref >59)

## 2023-02-27 LAB — HEMOGLOBIN A1C
Est. average glucose Bld gHb Est-mCnc: 88 mg/dL
Hgb A1c MFr Bld: 4.7 % — ABNORMAL LOW (ref 4.8–5.6)

## 2023-02-27 LAB — PROTIME-INR
INR: 1 (ref 0.9–1.2)
Prothrombin Time: 11.2 s (ref 9.1–12.0)

## 2023-02-27 LAB — TSH: TSH: 0.963 u[IU]/mL (ref 0.450–4.500)

## 2023-02-27 LAB — HIV ANTIBODY (ROUTINE TESTING W REFLEX): HIV Screen 4th Generation wRfx: NONREACTIVE

## 2023-02-27 LAB — SICKLE CELL SCREEN: Sickle Cell Screen: POSITIVE — AB

## 2023-03-10 HISTORY — PX: LIPOSUCTION: SHX10

## 2023-03-21 ENCOUNTER — Ambulatory Visit: Payer: Managed Care, Other (non HMO) | Admitting: Family Medicine

## 2023-08-27 LAB — BASIC METABOLIC PANEL WITH GFR
BUN: 9 (ref 4–21)
CO2: 27 — AB (ref 13–22)
Chloride: 103 (ref 99–108)
Creatinine: 1.5 — AB (ref 0.6–1.3)
Glucose: 75
Potassium: 4.5 meq/L (ref 3.5–5.1)
Sodium: 140 (ref 137–147)

## 2023-08-27 LAB — COMPREHENSIVE METABOLIC PANEL WITH GFR
Albumin: 4.4 (ref 3.5–5.0)
Calcium: 9.4 (ref 8.7–10.7)
eGFR: 56

## 2023-08-27 LAB — CBC AND DIFFERENTIAL
HCT: 46 (ref 41–53)
Hemoglobin: 15.7 (ref 13.5–17.5)
Neutrophils Absolute: 4.7
Platelets: 264 K/uL (ref 150–400)
WBC: 8.1

## 2023-08-27 LAB — CBC: RBC: 5.31 — AB (ref 3.87–5.11)

## 2023-08-28 ENCOUNTER — Encounter: Payer: Self-pay | Admitting: Family Medicine

## 2023-09-04 NOTE — Progress Notes (Deleted)
 [facility] [address] [place], [place] [address] [phone]  Date:  09/10/2023   Name:  Erik Weber   DOB:  02-Nov-1975   MRN:  [id number]  PCP:  Watt Harlene BROCKS, MD    Chief Complaint: No chief complaint on file.   History of Present Illness:  Erik Weber is a 48 y.o. very pleasant male patient who presents with the following:  Pt seen today for 6 month recheck visit Last seen by myself in January   History of chronic kidney disease, sleep apnea, hypertension, gastric bypass in September 2020, SS trait, flecainide  for PVCs per cardiology, sickle cell trait He is followed by nephrology-[facility] Dr. Sheena is his general cardiologist, Dr. Inocencio for electrophysiology  At our last visit he was planning to have excess skin removal surgery following weight loss  Labs completed just recently on chart from his nephrologist'  Lab Results  Component Value Date   HGBA1C 4.7 (L) 02/19/2023     Patient Active Problem List   Diagnosis Date Noted   Sickle cell trait (HCC) 05/12/2020   Chronic renal insufficiency    Fracture of base of fifth metacarpal bone of right hand    GERD (gastroesophageal reflux disease)    Hypertension    Morbid obesity (HCC)    Positive TB test    Sleep apnea    Iron  deficiency anemia secondary to inadequate dietary iron  intake 12/04/2019   CKD (chronic kidney disease) stage 3, GFR 30-59 ml/min (HCC) 11/25/2018   OSA (obstructive sleep apnea) 10/28/2018   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   Pneumonitis 10/04/2018   Obesity 08/18/2013   Tinea versicolor 05/19/2013   Hypertriglyceridemia 05/28/2012   Hypogonadism male 06/07/2011   Essential hypertension 09/21/2009   Disorder resulting from impaired renal function 05/01/2007   GERD 04/04/2007    Past Medical History:  Diagnosis Date   Chronic renal insufficiency    CKD (chronic kidney disease) stage 3, GFR  30-59 ml/min (HCC) 11/25/2018   Managed by [facility] Dr Dolan.  Secondary to hypertensive nephropathy    Disorder resulting from impaired renal function 05/01/2007   Thought due to hypertension, followed by Dr. Perri     Essential hypertension 09/21/2009   Qualifier: Diagnosis of  By: Georgian ROSALEA CHARM Lamar    Fracture of base of fifth metacarpal bone of right hand    GERD 04/04/2007   Qualifier: Diagnosis of  By: Georgian ROSALEA CHARM Lamar    GERD (gastroesophageal reflux disease)    hx of    Hypertension    Hypertriglyceridemia 05/28/2012   Hypogonadism male 06/07/2011   Iron  deficiency anemia secondary to inadequate dietary iron  intake 12/04/2019   Morbid obesity (HCC)    Obesity 08/18/2013   OSA (obstructive sleep apnea) 10/28/2018   Pneumonitis 10/04/2018   after accidental inhalation of swimming pool water  during swimmng lesson   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   Sleep apnea    Mild, no cpap needed   Tinea versicolor 05/19/2013    Past Surgical History:  Procedure Laterality Date   ADENOIDECTOMY     childhood   GASTRIC ROUX-EN-Y N/A 10/28/2018   Procedure: LAPAROSCOPIC ROUX-EN-Y GASTRIC BYPASS WITH UPPER ENDOSCOPY;  Surgeon: Tanda Locus, MD;  Location: [facility];  Service: General;  Laterality: N/A;   HAND SURGERY Right 02/2016   UPPER GI ENDOSCOPY  03/10/2016   XI ROBOTIC ASSISTED INGUINAL HERNIA REPAIR WITH MESH Left 10/10/2019   Procedure: XI ROBOTIC ASSISTED LEFT INGUINAL HERNIA REPAIR WITH MESH;  Surgeon: Tanda Locus, MD;  Location: [facility];  Service: General;  Laterality: Left;    Social History   Tobacco Use   Smoking status: Never   Smokeless tobacco: Never  Vaping Use   Vaping status: Never Used  Substance Use Topics   Alcohol use: No    Alcohol/week: 0.0 standard drinks of alcohol   Drug use: No    Family History  Problem Relation Age of Onset   Kidney cancer Mother 68   Hypertension Father        age 3   Diabetes Father     Lung cancer Paternal Uncle    Colon cancer Neg Hx    Prostate cancer Neg Hx    Liver disease Neg Hx    Esophageal cancer Neg Hx    Stomach cancer Neg Hx    Pancreatic cancer Neg Hx     No Known Allergies  Medication list has been reviewed and updated.  Current Outpatient Medications on File Prior to Visit  Medication Sig Dispense Refill   Ferrous Sulfate (IRON  PO) Take by mouth.     flecainide  (TAMBOCOR ) 50 MG tablet Take 1 tablet (50 mg total) by mouth daily. 90 tablet 3   Multiple Vitamins-Minerals (MULTIVITAMIN WITH MINERALS) tablet Take 1 tablet by mouth daily.     topiramate  (TOPAMAX ) 100 MG tablet TAKE 1 TABLET BY MOUTH EVERY DAY 90 tablet 0   VITAMIN D , CHOLECALCIFEROL, PO Take by mouth.     No current facility-administered medications on file prior to visit.    Review of Systems:  As per HPI- otherwise negative.   Physical Examination: There were no vitals filed for this visit. There were no vitals filed for this visit. There is no height or weight on file to calculate BMI. Ideal Body Weight:    GEN: no acute distress. HEENT: Atraumatic, Normocephalic.  Ears and Nose: No external deformity. CV: RRR, No M/G/R. No JVD. No thrill. No extra heart sounds. PULM: CTA B, no wheezes, crackles, rhonchi. No retractions. No resp. distress. No accessory muscle use. ABD: S, NT, ND, +BS. No rebound. No HSM. EXTR: No c/c/e PSYCH: Normally interactive. Conversant.    Assessment and Plan: ***  Signed Harlene Schroeder, MD

## 2023-09-04 NOTE — Patient Instructions (Incomplete)
 Good to see you again today

## 2023-09-10 ENCOUNTER — Ambulatory Visit: Payer: Managed Care, Other (non HMO) | Admitting: Family Medicine

## 2023-09-10 DIAGNOSIS — I10 Essential (primary) hypertension: Secondary | ICD-10-CM

## 2023-09-10 DIAGNOSIS — Z9884 Bariatric surgery status: Secondary | ICD-10-CM

## 2023-09-10 DIAGNOSIS — N183 Chronic kidney disease, stage 3 unspecified: Secondary | ICD-10-CM

## 2023-09-20 NOTE — Patient Instructions (Incomplete)
 Good to see you again today

## 2023-09-20 NOTE — Progress Notes (Unsigned)
 [facility] [address], [address] [place], [place] [address] [phone]  Date:  09/24/2023   Name:  Erik Weber   DOB:  [birth year]   MRN:  [id number]  PCP:  Watt Harlene BROCKS, MD    Chief Complaint: No chief complaint on file.   History of Present Illness:  Erik Weber is a 48 y.o. very pleasant male patient who presents with the following:  Pt seen today for physical exam Last seen by myself in January   History of chronic kidney disease, sleep apnea, hypertension, gastric bypass in September 2020, SS trait, flecainide  for PVCs per cardiology, sickle cell trait He is followed by nephrology-[facility]- seen just last month  Dr. Sheena is his general cardiologist, Dr. Inocencio for electrophysiology  At our last visit he was planning to have excess skin removal surgery following weight loss  Labs completed just recently on chart from his nephrologist'  Lab Results  Component Value Date   HGBA1C 4.7 (L) 02/19/2023     Patient Active Problem List   Diagnosis Date Noted   Sickle cell trait (HCC) 05/12/2020   Chronic renal insufficiency    Fracture of base of fifth metacarpal bone of right hand    GERD (gastroesophageal reflux disease)    Hypertension    Morbid obesity (HCC)    Positive TB test    Sleep apnea    Iron  deficiency anemia secondary to inadequate dietary iron  intake 12/04/2019   CKD (chronic kidney disease) stage 3, GFR 30-59 ml/min (HCC) 11/25/2018   OSA (obstructive sleep apnea) 10/28/2018   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   Pneumonitis 10/04/2018   Obesity 08/18/2013   Tinea versicolor 05/19/2013   Hypertriglyceridemia 05/28/2012   Hypogonadism male 06/07/2011   Essential hypertension 09/21/2009   Disorder resulting from impaired renal function 05/01/2007   GERD 04/04/2007    Past Medical History:  Diagnosis Date   Chronic renal insufficiency    CKD (chronic kidney  disease) stage 3, GFR 30-59 ml/min (HCC) 11/25/2018   Managed by [facility] Dr Dolan.  Secondary to hypertensive nephropathy    Disorder resulting from impaired renal function 05/01/2007   Thought due to hypertension, followed by Dr. Perri     Essential hypertension 09/21/2009   Qualifier: Diagnosis of  By: Georgian ROSALEA CHARM Lamar    Fracture of base of fifth metacarpal bone of right hand    GERD 04/04/2007   Qualifier: Diagnosis of  By: Georgian ROSALEA CHARM Lamar    GERD (gastroesophageal reflux disease)    hx of    Hypertension    Hypertriglyceridemia 05/28/2012   Hypogonadism male 06/07/2011   Iron  deficiency anemia secondary to inadequate dietary iron  intake 12/04/2019   Morbid obesity (HCC)    Obesity 08/18/2013   OSA (obstructive sleep apnea) 10/28/2018   Pneumonitis 10/04/2018   after accidental inhalation of swimming pool water  during swimmng lesson   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   Sleep apnea    Mild, no cpap needed   Tinea versicolor 05/19/2013    Past Surgical History:  Procedure Laterality Date   ADENOIDECTOMY     childhood   GASTRIC ROUX-EN-Y N/A 10/28/2018   Procedure: LAPAROSCOPIC ROUX-EN-Y GASTRIC BYPASS WITH UPPER ENDOSCOPY;  Surgeon: Tanda Locus, MD;  Location: [facility];  Service: General;  Laterality: N/A;   HAND SURGERY Right 02/2016   UPPER  GI ENDOSCOPY  03/10/2016   XI ROBOTIC ASSISTED INGUINAL HERNIA REPAIR WITH MESH Left 10/10/2019   Procedure: XI ROBOTIC ASSISTED LEFT INGUINAL HERNIA REPAIR WITH MESH;  Surgeon: Tanda Locus, MD;  Location: [facility];  Service: General;  Laterality: Left;    Social History   Tobacco Use   Smoking status: Never   Smokeless tobacco: Never  Vaping Use   Vaping status: Never Used  Substance Use Topics   Alcohol use: No    Alcohol/week: 0.0 standard drinks of alcohol   Drug use: No    Family History  Problem Relation Age of Onset   Kidney cancer Mother 5   Hypertension Father        age 49    Diabetes Father    Lung cancer Paternal Uncle    Colon cancer Neg Hx    Prostate cancer Neg Hx    Liver disease Neg Hx    Esophageal cancer Neg Hx    Stomach cancer Neg Hx    Pancreatic cancer Neg Hx     No Known Allergies  Medication list has been reviewed and updated.  Current Outpatient Medications on File Prior to Visit  Medication Sig Dispense Refill   Ferrous Sulfate (IRON  PO) Take by mouth.     flecainide  (TAMBOCOR ) 50 MG tablet Take 1 tablet (50 mg total) by mouth daily. 90 tablet 3   Multiple Vitamins-Minerals (MULTIVITAMIN WITH MINERALS) tablet Take 1 tablet by mouth daily.     topiramate  (TOPAMAX ) 100 MG tablet TAKE 1 TABLET BY MOUTH EVERY DAY 90 tablet 0   VITAMIN D , CHOLECALCIFEROL, PO Take by mouth.     No current facility-administered medications on file prior to visit.    Review of Systems:  As per HPI- otherwise negative.   Physical Examination: There were no vitals filed for this visit. There were no vitals filed for this visit. There is no height or weight on file to calculate BMI. Ideal Body Weight:    GEN: no acute distress. HEENT: Atraumatic, Normocephalic.  Ears and Nose: No external deformity. CV: RRR, No M/G/R. No JVD. No thrill. No extra heart sounds. PULM: CTA B, no wheezes, crackles, rhonchi. No retractions. No resp. distress. No accessory muscle use. ABD: S, NT, ND, +BS. No rebound. No HSM. EXTR: No c/c/e PSYCH: Normally interactive. Conversant.    Assessment and Plan: *** Pt seen today for a CPE- recommend healthy diet and exercise routine  Signed Harlene Schroeder, MD

## 2023-09-24 ENCOUNTER — Other Ambulatory Visit: Payer: Self-pay

## 2023-09-24 ENCOUNTER — Encounter: Payer: Self-pay | Admitting: Family Medicine

## 2023-09-24 ENCOUNTER — Ambulatory Visit (INDEPENDENT_AMBULATORY_CARE_PROVIDER_SITE_OTHER): Admitting: Family Medicine

## 2023-09-24 VITALS — BP 136/86 | HR 64 | Temp 98.4°F | Ht 72.0 in | Wt 213.4 lb

## 2023-09-24 DIAGNOSIS — N182 Chronic kidney disease, stage 2 (mild): Secondary | ICD-10-CM | POA: Diagnosis not present

## 2023-09-24 DIAGNOSIS — Z125 Encounter for screening for malignant neoplasm of prostate: Secondary | ICD-10-CM | POA: Diagnosis not present

## 2023-09-24 DIAGNOSIS — Z0000 Encounter for general adult medical examination without abnormal findings: Secondary | ICD-10-CM

## 2023-09-24 DIAGNOSIS — Z2981 Encounter for HIV pre-exposure prophylaxis: Secondary | ICD-10-CM

## 2023-09-24 DIAGNOSIS — Z13220 Encounter for screening for lipoid disorders: Secondary | ICD-10-CM

## 2023-09-24 LAB — HEPATIC FUNCTION PANEL
ALT: 22 U/L (ref 0–53)
AST: 22 U/L (ref 0–37)
Albumin: 4.5 g/dL (ref 3.5–5.2)
Alkaline Phosphatase: 84 U/L (ref 39–117)
Bilirubin, Direct: 0.2 mg/dL (ref 0.0–0.3)
Total Bilirubin: 0.9 mg/dL (ref 0.2–1.2)
Total Protein: 7.2 g/dL (ref 6.0–8.3)

## 2023-09-24 LAB — PSA: PSA: 0.44 ng/mL (ref 0.10–4.00)

## 2023-09-24 MED ORDER — EMTRICITABINE-TENOFOVIR DF 200-300 MG PO TABS: 1.0000 | ORAL_TABLET | Freq: Every day | ORAL | 3 refills | Status: DC

## 2023-09-25 ENCOUNTER — Ambulatory Visit: Payer: Self-pay | Admitting: Family Medicine

## 2023-09-25 LAB — LIPID PANEL
Cholesterol: 166 mg/dL (ref <200)
HDL: 56 mg/dL (ref >=40)
LDL Cholesterol (Calc): 88 mg/dL
Non-HDL Cholesterol (Calc): 110 mg/dL (ref <130)
Total CHOL/HDL Ratio: 3 (calc) (ref <5.0)
Triglycerides: 122 mg/dL (ref <150)

## 2023-09-25 LAB — HIV ANTIBODY (ROUTINE TESTING W REFLEX): HIV 1&2 Ab, 4th Generation: NONREACTIVE

## 2023-09-28 ENCOUNTER — Telehealth: Payer: Self-pay

## 2023-09-28 ENCOUNTER — Ambulatory Visit (INDEPENDENT_AMBULATORY_CARE_PROVIDER_SITE_OTHER): Admitting: Pharmacist

## 2023-09-28 ENCOUNTER — Other Ambulatory Visit (HOSPITAL_COMMUNITY): Payer: Self-pay

## 2023-09-28 ENCOUNTER — Other Ambulatory Visit: Payer: Self-pay

## 2023-09-28 ENCOUNTER — Encounter: Payer: Self-pay | Admitting: Family

## 2023-09-28 DIAGNOSIS — Z2981 Encounter for HIV pre-exposure prophylaxis: Secondary | ICD-10-CM | POA: Diagnosis not present

## 2023-09-28 DIAGNOSIS — Z113 Encounter for screening for infections with a predominantly sexual mode of transmission: Secondary | ICD-10-CM

## 2023-09-28 NOTE — Progress Notes (Signed)
 NEW REFERRAL TO [facility]

## 2023-09-28 NOTE — Telephone Encounter (Addendum)
 Pharmacy Patient Advocate Encounter  Insurance verification completed.   The patient is insured through [employer]   Ran test claim for Truvada. Currently a quantity of 30 is a 30 day supply and Next fill 10/17/23 . Apretude will need a PA. [name] $956.09 will need a copay card.  This test claim was processed through [facility]- copay amounts may vary at other pharmacies due to pharmacy/plan contracts, or as the patient moves through the different stages of their insurance plan.

## 2023-09-28 NOTE — Telephone Encounter (Signed)
 Submitted a Prior Authorization request to [employer] for [name] Injection via [employer] Portal. Will update once we receive a response.  J Code: CPT:  PA ID:  [id number]

## 2023-09-28 NOTE — Progress Notes (Signed)
 HPI: Erik Weber is a 48 y.o. male who presents to the [facility] pharmacy clinic to discuss and initiate PrEP.  Insured   [x]    Uninsured  []    Patient Active Problem List   Diagnosis Date Noted   Sickle cell trait (HCC) 05/12/2020   Chronic renal insufficiency    Fracture of base of fifth metacarpal bone of right hand    GERD (gastroesophageal reflux disease)    Hypertension    Morbid obesity (HCC)    Positive TB test    Sleep apnea    Iron  deficiency anemia secondary to inadequate dietary iron  intake 12/04/2019   CKD (chronic kidney disease) stage 3, GFR 30-59 ml/min (HCC) 11/25/2018   OSA (obstructive sleep apnea) 10/28/2018   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   Pneumonitis 10/04/2018   Obesity 08/18/2013   Tinea versicolor 05/19/2013   Hypertriglyceridemia 05/28/2012   Hypogonadism male 06/07/2011   Essential hypertension 09/21/2009   Disorder resulting from impaired renal function 05/01/2007    Patient's Medications  New Prescriptions   No medications on file  Previous Medications   EMTRICITABINE -TENOFOVIR  (TRUVADA) 200-300 MG TABLET    Take 1 tablet by mouth daily.   FERROUS SULFATE (IRON  PO)    Take by mouth.   MULTIPLE VITAMINS-MINERALS (MULTIVITAMIN WITH MINERALS) TABLET    Take 1 tablet by mouth daily.   VITAMIN D , CHOLECALCIFEROL, PO    Take by mouth.  Modified Medications   No medications on file  Discontinued Medications   No medications on file       09/28/2023    9:55 AM  CHL HIV PREP FLOWSHEET RESULTS  Insurance Status Insured  Gender at birth Male  # sex partners past 3-6 mos 1  Sex activity preferences Insertive  Condom use Yes  Treated for STI? No  PrEP Eligibility Yes    Labs:  SCr: Lab Results  Component Value Date   CREATININE 1.5 (A) 08/27/2023   CREATININE 1.44 (H) 02/19/2023   CREATININE 1.7 (A) 08/23/2022   CREATININE 1.46 01/25/2022   CREATININE 1.43 09/14/2021   HIV Lab Results  Component Value Date   HIV NON-REACTIVE  09/24/2023   HIV Non Reactive 02/19/2023   HIV NON-REACTIVE 09/18/2022   HIV NON-REACTIVE 06/08/2021   HIV NON-REACTIVE 08/08/2018   Hepatitis B Lab Results  Component Value Date   HEPBSAG NON-REACTIVE 06/08/2021   Hepatitis C Lab Results  Component Value Date   HEPCAB NON-REACTIVE 06/08/2021   Hepatitis A No results found for: HAV RPR and STI Lab Results  Component Value Date   LABRPR NON-REACTIVE 06/08/2021   LABRPR NON REAC 06/11/2015    STI Results GC CT CT  Latest Ref Rng & Units  Negative   06/08/2021 10:11 AM  Negative    06/11/2015  3:10 PM NOT DETECTED   NOT DETECTED     Assessment: Erik Weber presents today for PrEP initiation. He recently saw his PCP for PrEP initiation and was prescribed Descovy. He reports he has yet to start this medication because he wanted to talk about Apretude first. He mentioned concern about medications impacting his kidney function as he already has some impaired kidney function at baseline. We discussed that Descovy could have some impact on his kidneys. He prefers to avoid medications that may affect his kidneys and would like to avoid having to take a daily pill. At this time, he would like to continue with starting Apretude. We have started a prior authorization and will call the patient  once we have updated information. Today, we will check Hepatitis A antibody to assess immunity.   Number of partners in last 3 months: 1 Sexual practices: insertive   Use of prevention/protection: uses condoms  STD history: none History of IVDU? no History of PEP? no  Plan: - Will call patient and update patient on apretude cost - Check Hepatitis A antibody     Elma Fail, PharmD PGY1 Clinical Pharmacist [facility]  09/28/2023 10:04 AM

## 2023-09-29 LAB — HEPATITIS A ANTIBODY, TOTAL: Hepatitis A AB,Total: REACTIVE — AB

## 2023-10-03 ENCOUNTER — Other Ambulatory Visit (HOSPITAL_COMMUNITY): Payer: Self-pay

## 2023-10-03 ENCOUNTER — Ambulatory Visit: Admitting: Pharmacist

## 2023-10-04 ENCOUNTER — Encounter: Payer: Self-pay | Admitting: Family

## 2023-10-04 ENCOUNTER — Telehealth: Payer: Self-pay

## 2023-10-04 ENCOUNTER — Other Ambulatory Visit (HOSPITAL_COMMUNITY): Payer: Self-pay

## 2023-10-04 NOTE — Telephone Encounter (Addendum)
 Received notification from [facility] regarding a prior authorization for [name] Injection. Authorization has been APPROVED from 10/02/23 to 10/02/24.   Per test claim, copay for 180 days supply is $956.09  Patient can fill through [facility]: [phone]   Authorization # [id number] Phone # [phone]

## 2023-10-04 NOTE — Telephone Encounter (Signed)
 Submitted a Prior Authorization request to [employer] for Apretude via [employer]. Will update once we receive a response.  J Code: CPT:  PA ID: [id number]

## 2023-10-10 ENCOUNTER — Encounter: Payer: Self-pay | Admitting: Pharmacist

## 2023-10-10 ENCOUNTER — Other Ambulatory Visit: Payer: Self-pay

## 2023-10-10 ENCOUNTER — Other Ambulatory Visit (HOSPITAL_COMMUNITY): Payer: Self-pay

## 2023-10-10 ENCOUNTER — Telehealth: Payer: Self-pay | Admitting: Pharmacist

## 2023-10-10 DIAGNOSIS — Z2981 Encounter for HIV pre-exposure prophylaxis: Secondary | ICD-10-CM

## 2023-10-10 MED ORDER — YEZTUGO 463.5 MG/1.5ML ~~LOC~~ SOLN
927.0000 mg | SUBCUTANEOUS | 1 refills | Status: AC
  Filled 2023-10-10: qty 3, 180d supply, fill #0

## 2023-10-10 MED ORDER — YEZTUGO 300 MG PO TABS
600.0000 mg | ORAL_TABLET | Freq: Every day | ORAL | 0 refills | Status: AC
  Filled 2023-10-10: qty 4, 2d supply, fill #0

## 2023-10-10 NOTE — Progress Notes (Signed)
 Specialty Pharmacy Initial Fill Coordination Note  Erik Weber is a 48 y.o. male contacted today regarding initial fill of specialty medication(s) Lenacapavir Sodium  [name]   Patient requested Courier to Provider Office   Delivery date: 10/15/23   Verified address: [address] [place] [address]   Medication will be filled on 10/11/23.   Patient is aware of 0.00 copayment.

## 2023-10-10 NOTE — Telephone Encounter (Signed)
 Patient is approved to start [name] for PrEP. Covered at no charge through his insurance. Counseled patient that they will need to complete an oral loading dose upon initiating injections. Counseled that patient will take two Sunlenca  300 mg tablets (600 mg total) orally on the first day of their injection and will take two Sunlenca  300 mg tablets (600 mg total) orally the next day regardless of meals. Counseled patient that Sunlenca  is two separate subcutaneous injections in the abdomen every 6 months. Reviewed that the main side effects are injection-site soreness and nodules. Discussed measures for relief including cold packs and over-the-counter anti-inflammatories. Scheduled with me next Wednesday. Will send prescriptions to [facility]   Alan Geralds, PharmD, CPP, BCIDP, AAHIVP Clinical Pharmacist Practitioner Infectious Diseases Clinical Pharmacist [facility] for [facility]

## 2023-10-11 ENCOUNTER — Other Ambulatory Visit: Payer: Self-pay

## 2023-10-11 ENCOUNTER — Other Ambulatory Visit (HOSPITAL_COMMUNITY): Payer: Self-pay

## 2023-10-11 ENCOUNTER — Other Ambulatory Visit: Payer: Self-pay | Admitting: Pharmacy Technician

## 2023-10-12 ENCOUNTER — Other Ambulatory Visit: Payer: Self-pay

## 2023-10-15 ENCOUNTER — Telehealth: Payer: Self-pay

## 2023-10-15 NOTE — Telephone Encounter (Signed)
 [facility] Patient Advocate Encounter  Patient's medications [name] have been couriered to [facility] from [facility] and will be administered at the patients appointment on 10/17/23.  Charmaine Sharps, CPhT Specialty Pharmacy Patient [facility] for Infectious Disease Phone: [phone] Fax:  [phone]

## 2023-10-16 NOTE — Progress Notes (Unsigned)
 HPI: Erik Weber is a 48 y.o. male who presents to the [facility] pharmacy clinic to discuss and initiate [name].  Insured   [x]    Uninsured  []    Patient Active Problem List   Diagnosis Date Noted   Sickle cell trait (HCC) 05/12/2020   Chronic renal insufficiency    Fracture of base of fifth metacarpal bone of right hand    GERD (gastroesophageal reflux disease)    Hypertension    Morbid obesity (HCC)    Positive TB test    Sleep apnea    Iron  deficiency anemia secondary to inadequate dietary iron  intake 12/04/2019   CKD (chronic kidney disease) stage 3, GFR 30-59 ml/min (HCC) 11/25/2018   OSA (obstructive sleep apnea) 10/28/2018   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   Pneumonitis 10/04/2018   Obesity 08/18/2013   Tinea versicolor 05/19/2013   Hypertriglyceridemia 05/28/2012   Hypogonadism male 06/07/2011   Essential hypertension 09/21/2009   Disorder resulting from impaired renal function 05/01/2007    Patient's Medications  New Prescriptions   No medications on file  Previous Medications   FERROUS SULFATE (IRON  PO)    Take by mouth.   MULTIPLE VITAMINS-MINERALS (MULTIVITAMIN WITH MINERALS) TABLET    Take 1 tablet by mouth daily.   SQ INJECTION LENACAPAVIR ([name]) 463.5 MG/1.5ML SQ INJECTION    Inject 3 mLs (927 mg total) into the skin every 6 (six) months. Administer each injection subcutaneously at separate sites in the abdomen (more or equal to 2 inches from the navel).   VITAMIN D , CHOLECALCIFEROL, PO    Take by mouth.  Modified Medications   No medications on file  Discontinued Medications   No medications on file       09/28/2023    9:55 AM  CHL HIV PREP FLOWSHEET RESULTS  Insurance Status Insured  Gender at birth Male  # sex partners past 3-6 mos 1  Sex activity preferences Insertive  Condom use Yes  Treated for STI? No  PrEP Eligibility Yes    Labs:  SCr: Lab Results  Component Value Date   CREATININE 1.5 (A) 08/27/2023   CREATININE 1.44 (H)  02/19/2023   CREATININE 1.7 (A) 08/23/2022   CREATININE 1.46 01/25/2022   CREATININE 1.43 09/14/2021   HIV Lab Results  Component Value Date   HIV NON-REACTIVE 09/24/2023   HIV Non Reactive 02/19/2023   HIV NON-REACTIVE 09/18/2022   HIV NON-REACTIVE 06/08/2021   HIV NON-REACTIVE 08/08/2018   Hepatitis B Lab Results  Component Value Date   HEPBSAG NON-REACTIVE 06/08/2021   Hepatitis C Lab Results  Component Value Date   HEPCAB NON-REACTIVE 06/08/2021   Hepatitis A Lab Results  Component Value Date   HAV REACTIVE (A) 09/28/2023   RPR and STI Lab Results  Component Value Date   LABRPR NON-REACTIVE 06/08/2021   LABRPR NON REAC 06/11/2015    STI Results GC CT CT  Latest Ref Rng & Units  Negative   06/08/2021 10:11 AM  Negative    06/11/2015  3:10 PM NOT DETECTED   NOT DETECTED     Assessment: Erik Weber is presenting today for his initial [name] injection/pills and to follow up for HIV PrEP. He was previously prescribed Descovy but did not start it since he prefers to not take a pill daily. Screened for acute HIV symptoms such as fatigue, muscle aches, rash, sore throat, lymphadenopathy, headache, night sweats, nausea/vomiting/diarrhea, and fever. Denies any symptoms. Will check HIV antibody today.   Counseled that at this initiation  visit he will take two Sunlenca  300 mg tablets (600 mg) and we will administer two subcutaneous injections in the abdomen four inches apart and four inches from the belly button. Counseled he will take two Sunlenca  300 mg tablets (600 mg) tomorrow to complete the initiation series. Counseled that maintenance Erik Weber is two subcutaneous injections in the abdomen four inches apart and four inches from the belly button for each visit. Explained he will get two injections every 6 months.    Explained that showing up to injection appointments is very important and warned that if appointments are missed, protection will be minimal and the risk of  acquiring HIV becomes much higher. Counseled on possible side effects associated with the injections such as injection site soreness, long-lasting nodules, and redness. Asked to call the clinic or send me a mychart message if they experience any issues. Advised that he can take Motrin or Tylenol  for injection site pain and use an ice pack if needed. He may also pre-treat with Motrin or Tylenol  30-45 minutes before scheduled appointments.   Erik Weber is eligible for the updated flu vaccine but politely declines at this time.   Plan: -Administered [name] (2x 1.5 mL) in the abdomen, 2 inches from belly button and 4 inches apart -Collect HIV antibody today -Next injections scheduled for 04/09/24 with Erik Weber, PharmD PGY1 Pharmacy Resident [facility]  10/16/2023 9:36 PM

## 2023-10-17 ENCOUNTER — Ambulatory Visit (INDEPENDENT_AMBULATORY_CARE_PROVIDER_SITE_OTHER): Admitting: Pharmacist

## 2023-10-17 ENCOUNTER — Other Ambulatory Visit: Payer: Self-pay

## 2023-10-17 DIAGNOSIS — Z2981 Encounter for HIV pre-exposure prophylaxis: Secondary | ICD-10-CM | POA: Diagnosis not present

## 2023-10-17 MED ORDER — LENACAPAVIR SODIUM 463.5 MG/1.5ML ~~LOC~~ SOLN
927.0000 mg | Freq: Once | SUBCUTANEOUS | Status: AC
  Administered 2023-10-17: 927 mg via SUBCUTANEOUS

## 2023-10-18 LAB — HIV ANTIBODY (ROUTINE TESTING W REFLEX)
HIV 1&2 Ab, 4th Generation: NONREACTIVE
HIV FINAL INTERPRETATION: NEGATIVE

## 2023-10-19 ENCOUNTER — Ambulatory Visit: Admitting: Student

## 2023-10-19 NOTE — Progress Notes (Deleted)
  Electrophysiology Office Note:   Date:  10/19/2023  ID:  MICO SPARK, DOB 1975-03-02, MRN [id number]  Primary Cardiologist: Dub Huntsman, DO Electrophysiologist: Will Gladis Norton, MD   Electrophysiologist:  Soyla Gladis Norton, MD  {Click to update primary MD,subspecialty MD or APP then REFRESH:1}    History of Present Illness:   Erik Weber is a 48 y.o. 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   Since last being seen in our clinic the patient reports doing ***.  he denies chest pain, palpitations, dyspnea, PND, orthopnea, nausea, vomiting, dizziness, syncope, edema, weight gain, or early satiety.   Review of systems complete and found to be negative unless listed in HPI.   EP Information / Studies Reviewed:    EKG is ordered today. Personal review as below.       Arrhythmia/Device History No specialty comments available.   Physical Exam:   VS:  There were no vitals taken for this visit.   Wt Readings from Last 3 Encounters:  09/24/23 213 lb 6.4 oz (96.8 kg)  02/19/23 203 lb 3.2 oz (92.2 kg)  01/10/23 204 lb (92.5 kg)     GEN: No acute distress NECK: No JVD; No carotid bruits CARDIAC: {EPRHYTHM:28826}, no murmurs, rubs, gallops RESPIRATORY:  Clear to auscultation without rales, wheezing or rhonchi  ABDOMEN: Soft, non-tender, non-distended EXTREMITIES:  {EDEMA LEVEL:28147::No} edema; No deformity   ASSESSMENT AND PLAN:    PVCs EKG today shows *** *** flecainide   Mobitz 1 AV block Diltiazem  previously stopped due to Mobitz 1 AV block.   If he does have further atrial arrhythmias, may need to restart this. Would need to follow very closely.  {Click here to Review PMH, Prob List, Meds, Allergies, SHx, FHx  :1}   Follow up with {EPMDS:28135::EP Team} {EPFOLLOW UP:28173}  Signed, Ozell Prentice Passey, PA-C

## 2023-10-25 NOTE — Progress Notes (Unsigned)
  Electrophysiology Office Note:   Date:  10/26/2023  ID:  Erik Weber, DOB 20-Mar-1975, MRN [id number]  Primary Cardiologist: Dub Huntsman, DO Electrophysiologist: Will Gladis Norton, MD   Electrophysiologist:  Soyla Gladis Norton, MD      History of Present Illness:   Erik Weber is a 48 y.o. 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   Since last being seen in our clinic the patient reports doing very well. Has been off flecainide  for a couple of months, doesn't .  he denies chest pain, palpitations, dyspnea, PND, orthopnea, nausea, vomiting, dizziness, syncope, edema, weight gain, or early satiety.   Review of systems complete and found to be negative unless listed in HPI.   EP Information / Studies Reviewed:    EKG is ordered today. Personal review as below.  EKG Interpretation Date/Time:  Friday October 26 2023 08:27:40 EDT Ventricular Rate:  59 PR Interval:  212 QRS Duration:  76 QT Interval:  404 QTC Calculation: 399 R Axis:   11  Text Interpretation: Sinus bradycardia with 1st degree A-V block Minimal voltage criteria for LVH, may be normal variant ( R in aVL ) When compared with ECG of 14-Sep-2022 12:08, No significant change was found Confirmed by Lesia Sharper [phone]) on 10/26/2023 8:42:47 AM    Arrhythmia/Device History No specialty comments available.   Physical Exam:   VS:  BP (!) 144/82 (BP Location: Right Arm, Patient Position: Sitting)   Pulse (!) 59   Ht 6' (1.829 m)   Wt 212 lb 6.4 oz (96.3 kg)   SpO2 99%   BMI 28.81 kg/m    Wt Readings from Last 3 Encounters:  10/26/23 212 lb 6.4 oz (96.3 kg)  09/24/23 213 lb 6.4 oz (96.8 kg)  02/19/23 203 lb 3.2 oz (92.2 kg)     GEN: No acute distress NECK: No JVD; No carotid bruits CARDIAC: Regular rate and rhythm, no murmurs, rubs, gallops RESPIRATORY:  Clear to auscultation without rales, wheezing or rhonchi  ABDOMEN: Soft, non-tender,  non-distended EXTREMITIES:  No edema; No deformity   ASSESSMENT AND PLAN:    PVCs EKG today shows NSR without PVCs.  None on extended auscultation.  He has been off flecainide  for several months without issue  Will wear 3 day zio to quantify PVCs.  Mobitz 1 AV block Diltiazem  previously stopped due to Mobitz 1 AV block.   If he does have further atrial arrhythmias, may need to restart this. Would need to follow very closely.   Follow up with EP Team in 12 months as placeholder.   Signed, Sharper Prentice Lesia, PA-C

## 2023-10-26 ENCOUNTER — Ambulatory Visit (INDEPENDENT_AMBULATORY_CARE_PROVIDER_SITE_OTHER)

## 2023-10-26 ENCOUNTER — Encounter: Payer: Self-pay | Admitting: Student

## 2023-10-26 ENCOUNTER — Ambulatory Visit: Attending: Student | Admitting: Student

## 2023-10-26 VITALS — BP 144/82 | HR 59 | Ht 72.0 in | Wt 212.4 lb

## 2023-10-26 DIAGNOSIS — I493 Ventricular premature depolarization: Secondary | ICD-10-CM

## 2023-10-26 NOTE — Patient Instructions (Signed)
 Medication Instructions:  Your physician recommends that you continue on your current medications as directed. Please refer to the Current Medication list given to you today.  *If you need a refill on your cardiac medications before your next appointment, please call your pharmacy*  Lab Work: NONE ordered at this time of appointment   Testing/Procedures: 3 day Zio Monitor   Follow-Up: At [facility], you and your health needs are our priority.  As part of our continuing mission to provide you with exceptional heart care, our providers are all part of one team.  This team includes your primary Cardiologist (physician) and Advanced Practice Providers or APPs (Physician Assistants and Nurse Practitioners) who all work together to provide you with the care you need, when you need it.  Your next appointment:    1 year office visit   Provider:   Donnice Passey PA    We recommend signing up for the patient portal called MyChart.  Sign up information is provided on this After Visit Summary.  MyChart is used to connect with patients for Virtual Visits (Telemedicine).  Patients are able to view lab/test results, encounter notes, upcoming appointments, etc.  Non-urgent messages can be sent to your provider as well.   To learn more about what you can do with MyChart, go to [email or web address].   Other Instructions ZIO XT- Long Term Monitor Instructions  Your physician has requested you wear a ZIO patch monitor for 14 days.  This is a single patch monitor. [employer] supplies one patch monitor per enrollment. Additional stickers are not available. Please do not apply patch if you will be having a Nuclear Stress Test,  Echocardiogram, Cardiac CT, MRI, or Chest Xray during the period you would be wearing the  monitor. The patch cannot be worn during these tests. You cannot remove and re-apply the  ZIO XT patch monitor.  Your ZIO patch monitor will be mailed 3 day USPS to your address on  file. It may take 3-5 days  to receive your monitor after you have been enrolled.  Once you have received your monitor, please review the enclosed instructions. Your monitor  has already been registered assigning a specific monitor serial # to you.  Billing and Patient Assistance Program Information  We have supplied [employer] with any of your insurance information on file for billing purposes. [employer] offers a sliding scale Patient Assistance Program for patients that do not have  insurance, or whose insurance does not completely cover the cost of the ZIO monitor.  You must apply for the Patient Assistance Program to qualify for this discounted rate.  To apply, please call [employer] at [phone], select option 4, select option 2, ask to apply for  Patient Assistance Program. Meredeth will ask your household income, and how many people  are in your household. They will quote your out-of-pocket cost based on that information.  [employer] will also be able to set up a 58-month, interest-free payment plan if needed.  Applying the monitor   Shave hair from upper left chest.  Hold abrader disc by orange tab. Rub abrader in 40 strokes over the upper left chest as  indicated in your monitor instructions.  Clean area with 4 enclosed alcohol pads. Let dry.  Apply patch as indicated in monitor instructions. Patch will be placed under collarbone on left  side of chest with arrow pointing upward.  Rub patch adhesive wings for 2 minutes. Remove white label marked 1. Remove the white  label marked 2.  Rub patch adhesive wings for 2 additional minutes.  While looking in a mirror, press and release button in center of patch. A small green light will  flash 3-4 times. This will be your only indicator that the monitor has been turned on.  Do not shower for the first 24 hours. You may shower after the first 24 hours.  Press the button if you feel a symptom. You will hear a small click. Record Date, Time and   Symptom in the Patient Logbook.  When you are ready to remove the patch, follow instructions on the last 2 pages of Patient  Logbook. Stick patch monitor onto the last page of Patient Logbook.  Place Patient Logbook in the blue and white box. Use locking tab on box and tape box closed  securely. The blue and white box has prepaid postage on it. Please place it in the mailbox as  soon as possible. Your physician should have your test results approximately 7 days after the  monitor has been mailed back to [facility].  Call [facility] Customer Care at [phone] if you have questions regarding  your ZIO XT patch monitor. Call them immediately if you see an orange light blinking on your  monitor.  If your monitor falls off in less than 4 days, contact our Monitor department at [phone].  If your monitor becomes loose or falls off after 4 days call [employer] at [phone] for  suggestions on securing your monitor

## 2023-10-26 NOTE — Progress Notes (Unsigned)
Enrolled patient for a 3 day Zio XT monitor to be mailed to patients home   [place] to read

## 2023-10-30 ENCOUNTER — Other Ambulatory Visit (HOSPITAL_COMMUNITY): Payer: Self-pay

## 2023-11-05 ENCOUNTER — Other Ambulatory Visit: Payer: Self-pay

## 2023-11-30 DIAGNOSIS — I493 Ventricular premature depolarization: Secondary | ICD-10-CM | POA: Diagnosis not present

## 2023-12-04 ENCOUNTER — Ambulatory Visit: Payer: Self-pay | Admitting: Student

## 2024-01-29 ENCOUNTER — Other Ambulatory Visit

## 2024-01-29 ENCOUNTER — Encounter: Payer: Self-pay | Admitting: Gastroenterology

## 2024-01-29 ENCOUNTER — Ambulatory Visit: Admitting: Gastroenterology

## 2024-01-29 ENCOUNTER — Ambulatory Visit: Payer: Self-pay | Admitting: Gastroenterology

## 2024-01-29 VITALS — BP 124/72 | HR 78 | Ht 72.0 in | Wt 210.0 lb

## 2024-01-29 DIAGNOSIS — R1013 Epigastric pain: Secondary | ICD-10-CM | POA: Diagnosis not present

## 2024-01-29 DIAGNOSIS — Z9884 Bariatric surgery status: Secondary | ICD-10-CM | POA: Diagnosis not present

## 2024-01-29 DIAGNOSIS — Z862 Personal history of diseases of the blood and blood-forming organs and certain disorders involving the immune mechanism: Secondary | ICD-10-CM | POA: Diagnosis not present

## 2024-01-29 LAB — CBC WITH DIFFERENTIAL/PLATELET
Basophils Absolute: 0.1 K/uL (ref 0.0–0.1)
Basophils Relative: 0.9 % (ref 0.0–3.0)
Eosinophils Absolute: 0.1 K/uL (ref 0.0–0.7)
Eosinophils Relative: 1 % (ref 0.0–5.0)
HCT: 46 % (ref 39.0–52.0)
Hemoglobin: 15.3 g/dL (ref 13.0–17.0)
Lymphocytes Relative: 36.8 % (ref 12.0–46.0)
Lymphs Abs: 3.4 K/uL (ref 0.7–4.0)
MCHC: 33.3 g/dL (ref 30.0–36.0)
MCV: 86.9 fl (ref 78.0–100.0)
Monocytes Absolute: 1 K/uL (ref 0.1–1.0)
Monocytes Relative: 11.4 % (ref 3.0–12.0)
Neutro Abs: 4.5 K/uL (ref 1.4–7.7)
Neutrophils Relative %: 49.9 % (ref 43.0–77.0)
Platelets: 304 K/uL (ref 150.0–400.0)
RBC: 5.29 Mil/uL (ref 4.22–5.81)
RDW: 12.7 % (ref 11.5–15.5)
WBC: 9.1 K/uL (ref 4.0–10.5)

## 2024-01-29 LAB — COMPREHENSIVE METABOLIC PANEL WITH GFR
ALT: 30 U/L (ref 3–53)
AST: 22 U/L (ref 5–37)
Albumin: 4.4 g/dL (ref 3.5–5.2)
Alkaline Phosphatase: 104 U/L (ref 39–117)
BUN: 10 mg/dL (ref 6–23)
CO2: 30 meq/L (ref 19–32)
Calcium: 9.4 mg/dL (ref 8.4–10.5)
Chloride: 103 meq/L (ref 96–112)
Creatinine, Ser: 1.43 mg/dL (ref 0.40–1.50)
GFR: 57.62 mL/min — ABNORMAL LOW
Glucose, Bld: 82 mg/dL (ref 70–99)
Potassium: 4.6 meq/L (ref 3.5–5.1)
Sodium: 140 meq/L (ref 135–145)
Total Bilirubin: 0.7 mg/dL (ref 0.2–1.2)
Total Protein: 7.9 g/dL (ref 6.0–8.3)

## 2024-01-29 LAB — LIPASE: Lipase: 46 U/L (ref 11.0–59.0)

## 2024-01-29 NOTE — Progress Notes (Signed)
"   Attending Physician's Attestation   I have reviewed the chart.   I agree with the Advanced Practitioner's note, impression, and recommendations with any updates as below. Agree if endoscopic workup is unremarkable, cross-sectional imaging will be the next step.   Aloha Finner, MD  Gastroenterology Advanced Endoscopy Office # [phone]  "

## 2024-01-29 NOTE — Patient Instructions (Addendum)
 Your provider has requested that you go to the basement level for lab work before leaving today. Press B on the elevator. The lab is located at the first door on the left as you exit the elevator.  Please give us  a call if you have new or worsening symptoms.  You have been scheduled for an Endoscopy. Please follow written instructions given to you at your visit today.  If you use inhalers (even only as needed), please bring them with you on the day of your procedure.  If you take any of the following medications, they will need to be adjusted prior to your procedure:   DO NOT TAKE 7 DAYS PRIOR TO TEST- Trulicity (dulaglutide) Ozempic, Wegovy (semaglutide) Mounjaro (tirzepatide) Bydureon Bcise (exanatide extended release)  DO NOT TAKE 1 DAY PRIOR TO YOUR TEST Rybelsus (semaglutide) Adlyxin (lixisenatide) Victoza (liraglutide) Byetta (exanatide) ___________________________________________________________________________  Please follow up sooner if symptoms increase or worsen  Due to recent changes in healthcare laws, you may see the results of your imaging and laboratory studies on MyChart before your provider has had a chance to review them.  We understand that in some cases there may be results that are confusing or concerning to you. Not all laboratory results come back in the same time frame and the provider may be waiting for multiple results in order to interpret others.  Please give us  48 hours in order for your provider to thoroughly review all the results before contacting the office for clarification of your results.   Thank you for trusting me with your gastrointestinal care!   Camie Furbish, PA-C _______________________________________________________  If your blood pressure at your visit was 140/90 or greater, please contact your primary care physician to follow up on this.  _______________________________________________________  If you are age 59 or older, your body  mass index should be between 23-30. Your Body mass index is 28.48 kg/m. If this is out of the aforementioned range listed, please consider follow up with your Primary Care Provider.  If you are age 10 or younger, your body mass index should be between 19-25. Your Body mass index is 28.48 kg/m. If this is out of the aformentioned range listed, please consider follow up with your Primary Care Provider.   ________________________________________________________  The  GI providers would like to encourage you to use MYCHART to communicate with providers for non-urgent requests or questions.  Due to long hold times on the telephone, sending your provider a message by [facility] may be a faster and more efficient way to get a response.  Please allow 48 business hours for a response.  Please remember that this is for non-urgent requests.  _______________________________________________________

## 2024-01-29 NOTE — Progress Notes (Signed)
 "  Erik Weber [id number] 08-12-1975   Chief Complaint: Abdominal pain  Referring Provider: Watt Harlene BROCKS, MD Primary GI MD: Sampson (previous Dr. Teressa)  HPI: Erik Weber is a 48 y.o. male with past medical history of CKD stage III, HTN, GERD, HLD, obesity, IDA, OSA, prior gastric bypass 2020 who presents today for a complaint of abdominal pain.    Patient last seen in office 04/01/2019 by Dr. Teressa.  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 Tanda from [place] [facility].  Preoperative H. pylori blood test was negative.  Had been seen in our office a few years prior for dysphagia and was scheduled for EGD which was later canceled. Following gastric bypass lost about 70 pounds, but had postprandial burning pain lasting for few seconds several times during meals.  Was on PPI.  Plan at that time was for EGD.  He underwent EGD 04/28/2019.  Had 3 exposed surgical staples at otherwise normal GJ anastomosis which were removed with forceps.  Mild inflammation characterized by granularity found in the gastric remnant pouch and biopsies were taken.  Negative for H. pylori.  Recent labs unremarkable.  Negative Cologuard 01/30/2023.   Discussed the use of AI scribe software for clinical note transcription with the patient, who gave verbal consent to proceed.  History of Present Illness Erik Weber is a 48 year old male with prior gastric bypass surgery who presents with several months of intermittent upper abdominal burning pain.  Epigastric pain - Intermittent burning pain in the upper abdomen for several months - Most often occurs in the early morning hours - Pain is not daily but can be severe; one episode caused him to curl up in a ball - Each episode typically lasts a couple of hours - Pain was present at the time of the visit - No clear aggravating or relieving factors identified - Drinks a lot of sweet tea and wonders if this may be  related - Does not use over-the-counter medications for pain; prefers to let symptoms resolve spontaneously  Gastrointestinal surgical history - Gastric bypass performed in 2020 - Upper endoscopy with staple removal in 2021, after which symptoms resolved until current episode - Liposuction performed in February 2025; symptoms began well after that procedure - No recent surgical follow-up  Associated gastrointestinal and constitutional symptoms - No symptoms of acid reflux, heartburn, nausea, vomiting, fever, dysphagia, or food impaction - No unintentional weight loss, diarrhea, constipation, hematochezia, or melena - Bowel movements somewhat more frequent but otherwise unremarkable  Medication and supplement use - Takes over-the-counter iron  without prior issues - Does not use NSAIDs regularly - No recent changes in medications    Previous GI Procedures/Imaging   EGD 04/28/2019 - Anatomically typical appearing small gastric pouch, s/ p Roux gastric bypass 5 months ago.  - Normal afferent and efferent limbs ( intubated 5- 10cm each)  - Three exposed surgical staples at an otherwise normal GJ anastomosis. All three staples were removed with forceps.  - Mild inflammation characterized by granularity was found in the gastric remnant pouch. Biopsies were taken with a cold forceps for histology. Path: Surgical [P], gastric antrum and gastric body  - GASTRIC ANTRAL AND OXYNTIC MUCOSA WITH MILD CHRONIC GASTRITIS  - WARTHIN STARRY STAIN IS NEGATIVE FOR HELICOBACTER PYLORI   Past Medical History:  Diagnosis Date   Chronic renal insufficiency    CKD (chronic kidney disease) stage 3, GFR 30-59 ml/min (HCC) 11/25/2018   Managed by [facility]  Dr Dolan.  Secondary to hypertensive nephropathy    Disorder resulting from impaired renal function 05/01/2007   Thought due to hypertension, followed by Dr. Perri     Essential hypertension 09/21/2009   Qualifier: Diagnosis of  By: Georgian ROSALEA CHARM Lamar    Fracture of base of fifth metacarpal bone of right hand    GERD 04/04/2007   Qualifier: Diagnosis of  By: Georgian ROSALEA CHARM Lamar    GERD (gastroesophageal reflux disease)    hx of    Hypertension    Hypertriglyceridemia 05/28/2012   Hypogonadism male 06/07/2011   Iron  deficiency anemia secondary to inadequate dietary iron  intake 12/04/2019   Morbid obesity (HCC)    Obesity 08/18/2013   OSA (obstructive sleep apnea) 10/28/2018   Pneumonitis 10/04/2018   after accidental inhalation of swimming pool water  during swimmng lesson   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   Sleep apnea    Mild, no cpap needed   Tinea versicolor 05/19/2013    Past Surgical History:  Procedure Laterality Date   ADENOIDECTOMY     childhood   GASTRIC ROUX-EN-Y N/A 10/28/2018   Procedure: LAPAROSCOPIC ROUX-EN-Y GASTRIC BYPASS WITH UPPER ENDOSCOPY;  Surgeon: Tanda Locus, MD;  Location: [facility];  Service: General;  Laterality: N/A;   HAND SURGERY Right 02/2016   UPPER GI ENDOSCOPY  03/10/2016   XI ROBOTIC ASSISTED INGUINAL HERNIA REPAIR WITH MESH Left 10/10/2019   Procedure: XI ROBOTIC ASSISTED LEFT INGUINAL HERNIA REPAIR WITH MESH;  Surgeon: Tanda Locus, MD;  Location: [facility];  Service: General;  Laterality: Left;    Current Outpatient Medications  Medication Sig Dispense Refill   Ferrous Sulfate (IRON  PO) Take by mouth.     Multiple Vitamins-Minerals (MULTIVITAMIN WITH MINERALS) tablet Take 1 tablet by mouth daily.     SQ injection lenacapavir  ([name] ) 463.5 MG/1.5ML SQ injection Inject 3 mLs (927 mg total) into the skin every 6 (six) months. Administer each injection subcutaneously at separate sites in the abdomen (more or equal to 2 inches from the navel). 3 mL 1   VITAMIN D , CHOLECALCIFEROL, PO Take by mouth.     No current facility-administered medications for this visit.    Allergies as of 01/29/2024   (No Known Allergies)    Family History  Problem Relation Age of  Onset   Kidney cancer Mother 1   Hypertension Father        age 25   Diabetes Father    Lung cancer Paternal Uncle    Colon cancer Neg Hx    Prostate cancer Neg Hx    Liver disease Neg Hx    Esophageal cancer Neg Hx    Stomach cancer Neg Hx    Pancreatic cancer Neg Hx     Social History[1]   Review of Systems:    Constitutional: No weight loss, fever, chills Cardiovascular: No chest pain Respiratory: No SOB  Gastrointestinal: See HPI and otherwise negative   Physical Exam:  Vital signs: BP 124/72   Pulse 78   Ht 6' (1.829 m)   Wt 210 lb (95.3 kg)   BMI 28.48 kg/m   Constitutional: Pleasant, well-appearing male in NAD, alert and cooperative Head:  Normocephalic and atraumatic.  Respiratory: Respirations even and unlabored. Lungs clear to auscultation bilaterally.  No wheezes, crackles, or rhonchi.  Cardiovascular:  Regular rate and rhythm. No murmurs. No peripheral edema. Gastrointestinal:  Soft, nondistended, nontender. No rebound or guarding. Normal  bowel sounds. No appreciable masses or hepatomegaly. Rectal:  Not performed.  Neurologic:  Alert and oriented x4;  grossly normal neurologically.  Skin:   Dry and intact without significant lesions or rashes. Psychiatric: Oriented to person, place and time. Demonstrates good judgement and reason without abnormal affect or behaviors.   Echocardiogram 12/26/2019 1. Frequent PVC' s. Left ventricular ejection fraction, by estimation, is 55 to 60% . The left ventricle has normal function. The left ventricle has no regional wall motion abnormalities. There is mild concentric left ventricular hypertrophy. Left ventricular diastolic parameters were normal.  2. Right ventricular systolic function is normal. The right ventricular size is normal. There is normal pulmonary artery systolic pressure.  3. The mitral valve is normal in structure. No evidence of mitral valve regurgitation. No evidence of mitral stenosis.  4. The aortic  valve is tricuspid. Aortic valve regurgitation is not visualized. No aortic stenosis is present.  5. The inferior vena cava is normal in size with greater than 50% respiratory variability, suggesting right atrial pressure of 3 mmHg.   Assessment/Plan:   Assessment & Plan Epigastric pain History of Roux-en-Y gastric bypass Patient with several months of intermittent burning epigastric pain.  Not experiencing acid reflux or heartburn symptoms.  Denies any evidence of GI bleeding.  Does have history of iron  deficiency anemia and is on an iron  supplement.  Differential includes peptic ulcer, gastritis, post-gastric bypass complications, and less likely pancreatitis or other upper gastrointestinal pathology. Upper endoscopy indicated for further evaluation.  - Schedule upper endoscopy. I thoroughly discussed the procedure with the patient to include nature of the procedure, alternatives, benefits, and risks (including but not limited to bleeding, infection, perforation, anesthesia/cardiac/pulmonary complications). Patient verbalized understanding and gave verbal consent to proceed with procedure.  - Labs today: CBC, CMP, lipase - Consider abdominal imaging if no explanation for symptoms on EGD, or if symptoms worsen prior to endoscopic evaluation    Camie Furbish, PA-C [facility] 01/29/2024, 8:38 AM  Patient Care Team: Copland, Harlene BROCKS, MD as PCP - General (Family Medicine) Sheena Pugh, DO as PCP - Cardiology (Cardiology) Inocencio Soyla Lunger, MD as PCP - Electrophysiology (Cardiology) Perri Starleen BROCKS, MD as Consulting Physician (Nephrology) Puschinsky, Charlie ORN., MD as Consulting Physician (Urology)       [1]  Social History Tobacco Use   Smoking status: Never   Smokeless tobacco: Never  Vaping Use   Vaping status: Never Used  Substance Use Topics   Alcohol use: No    Alcohol/week: 0.0 standard drinks of alcohol   Drug use: No   "

## 2024-02-08 ENCOUNTER — Encounter: Payer: Self-pay | Admitting: Gastroenterology

## 2024-02-08 ENCOUNTER — Ambulatory Visit (AMBULATORY_SURGERY_CENTER): Payer: Self-pay | Admitting: Gastroenterology

## 2024-02-08 VITALS — BP 123/80 | HR 67 | Temp 98.3°F | Resp 16 | Ht 72.0 in | Wt 210.0 lb

## 2024-02-08 DIAGNOSIS — K2950 Unspecified chronic gastritis without bleeding: Secondary | ICD-10-CM

## 2024-02-08 DIAGNOSIS — R1013 Epigastric pain: Secondary | ICD-10-CM

## 2024-02-08 MED ORDER — OMEPRAZOLE 40 MG PO CPDR: 40.0000 mg | DELAYED_RELEASE_CAPSULE | Freq: Two times a day (BID) | ORAL | 3 refills | Status: AC

## 2024-02-08 MED ORDER — SODIUM CHLORIDE 0.9 % IV SOLN: 500.0000 mL | Freq: Once | INTRAVENOUS | Status: AC

## 2024-02-08 NOTE — Progress Notes (Signed)
 Called to room to assist during endoscopic procedure.  Patient ID and intended procedure confirmed with present staff. Received instructions for my participation in the procedure from the performing physician.

## 2024-02-08 NOTE — Patient Instructions (Addendum)
 Thank you for letting us  care for your healthcare needs today! Please see handout regarding Gastritis. - Resume previous diet - Start Omeprazole  40 mg twice daily- open the capsules and mix with applesauce or yogurt, then swallow immediately without chewing.  - Continue present medications - Await pathology results - Repeat upper endoscopy in 3 months to check healing - If within the next 3 weeks you are still having issues, reach out via MyChart or call the office. Still let us  know if you are doing better.  YOU HAD AN ENDOSCOPIC PROCEDURE TODAY AT THE [facility]:   Refer to the procedure report that was given to you for any specific questions about what was found during the examination.  If the procedure report does not answer your questions, please call your gastroenterologist to clarify.  If you requested that your care partner not be given the details of your procedure findings, then the procedure report has been included in a sealed envelope for you to review at your convenience later.  YOU SHOULD EXPECT: Some feelings of bloating in the abdomen. Passage of more gas than usual.  Walking can help get rid of the air that was put into your GI tract during the procedure and reduce the bloating. If you had a lower endoscopy (such as a colonoscopy or flexible sigmoidoscopy) you may notice spotting of blood in your stool or on the toilet paper. If you underwent a bowel prep for your procedure, you may not have a normal bowel movement for a few days.  Please Note:  You might notice some irritation and congestion in your nose or some drainage.  This is from the oxygen used during your procedure.  There is no need for concern and it should clear up in a day or so.  SYMPTOMS TO REPORT IMMEDIATELY:  Following upper endoscopy (EGD)  Vomiting of blood or coffee ground material  New chest pain or pain under the shoulder blades  Painful or persistently difficult swallowing  New shortness of  breath  Fever of 100F or higher  Black, tarry-looking stools  For urgent or emergent issues, a gastroenterologist can be reached at any hour by calling [phone]. Do not use MyChart messaging for urgent concerns.    DIET:  We do recommend a small meal at first, but then you may proceed to your regular diet.  Drink plenty of fluids but you should avoid alcoholic beverages for 24 hours.  ACTIVITY:  You should plan to take it easy for the rest of today and you should NOT DRIVE or use heavy machinery until tomorrow (because of the sedation medicines used during the test).    FOLLOW UP: Our staff will call the number listed on your records the next business day following your procedure.  We will call around 7:15- 8:00 am to check on you and address any questions or concerns that you may have regarding the information given to you following your procedure. If we do not reach you, we will leave a message.     If any biopsies were taken you will be contacted by phone or by letter within the next 1-3 weeks.  Please call us  at [phone] if you have not heard about the biopsies in 3 weeks.    SIGNATURES/CONFIDENTIALITY: You and/or your care partner have signed paperwork which will be entered into your electronic medical record.  These signatures attest to the fact that that the information above on your After Visit Summary has been  reviewed and is understood.  Full responsibility of the confidentiality of this discharge information lies with you and/or your care-partner.

## 2024-02-08 NOTE — Progress Notes (Signed)
 I have reviewed the patient's medical history in detail and updated the computerized patient record.

## 2024-02-08 NOTE — Progress Notes (Signed)
 "  GASTROENTEROLOGY PROCEDURE H&P NOTE   Primary Care Physician: Watt Harlene BROCKS, MD  HPI: Erik Weber is a 49 y.o. male who presents for EGD for evaluation of abdominal pain.  Past Medical History:  Diagnosis Date   Chronic renal insufficiency    CKD (chronic kidney disease) stage 3, GFR 30-59 ml/min (HCC) 11/25/2018   Managed by [facility] Dr [facility].  Secondary to hypertensive nephropathy    Disorder resulting from impaired renal function 05/01/2007   Thought due to hypertension, followed by Dr. Perri     Essential hypertension 09/21/2009   Qualifier: Diagnosis of  By: Georgian ROSALEA CHARM Lamar    Fracture of base of fifth metacarpal bone of right hand    GERD 04/04/2007   Qualifier: Diagnosis of  By: Georgian ROSALEA CHARM Lamar    GERD (gastroesophageal reflux disease)    hx of    Hypertension    Hypertriglyceridemia 05/28/2012   Hypogonadism male 06/07/2011   Iron  deficiency anemia secondary to inadequate dietary iron  intake 12/04/2019   Morbid obesity (HCC)    Obesity 08/18/2013   OSA (obstructive sleep apnea) 10/28/2018   Pneumonitis 10/04/2018   after accidental inhalation of swimming pool water  during swimmng lesson   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   Sleep apnea    Mild, no cpap needed   Tinea versicolor 05/19/2013   Past Surgical History:  Procedure Laterality Date   ADENOIDECTOMY     childhood   GASTRIC ROUX-EN-Y N/A 10/28/2018   Procedure: LAPAROSCOPIC ROUX-EN-Y GASTRIC BYPASS WITH UPPER ENDOSCOPY;  Surgeon: Tanda Locus, MD;  Location: [facility];  Service: General;  Laterality: N/A;   HAND SURGERY Right 02/2016   LIPOSUCTION Bilateral 03/2023   Entire abdomen   UPPER GI ENDOSCOPY  03/10/2016   XI ROBOTIC ASSISTED INGUINAL HERNIA REPAIR WITH MESH Left 10/10/2019   Procedure: XI ROBOTIC ASSISTED LEFT INGUINAL HERNIA REPAIR WITH MESH;  Surgeon: Tanda Locus, MD;  Location: [facility];  Service: General;  Laterality: Left;   Current  Outpatient Medications  Medication Sig Dispense Refill   Ferrous Sulfate (IRON  PO) Take by mouth.     Multiple Vitamins-Minerals (MULTIVITAMIN WITH MINERALS) tablet Take 1 tablet by mouth daily.     SQ injection lenacapavir  ([name] ) 463.5 MG/1.5ML SQ injection Inject 3 mLs (927 mg total) into the skin every 6 (six) months. Administer each injection subcutaneously at separate sites in the abdomen (more or equal to 2 inches from the navel). 3 mL 1   VITAMIN D , CHOLECALCIFEROL, PO Take by mouth.     Current Facility-Administered Medications  Medication Dose Route Frequency Provider Last Rate Last Admin   0.9 %  sodium chloride  infusion  500 mL Intravenous Once Mansouraty, Aloha Raddle., MD       Current Medications[1] Allergies[2] Family History  Problem Relation Age of Onset   Kidney cancer Mother 60   Hypertension Father        age 41   Diabetes Father    Lung cancer Paternal Uncle    Colon cancer Neg Hx    Prostate cancer Neg Hx    Liver disease Neg Hx    Esophageal cancer Neg Hx    Stomach cancer Neg Hx    Pancreatic cancer Neg Hx    Social History   Socioeconomic History   Marital status: Married    Spouse name: Not on file   Number of children: 1   Years  of education: 16   Highest education level: Bachelor's degree (e.g., BA, AB, BS)  Occupational History   Occupation: aeronautical engineer  Tobacco Use   Smoking status: Never   Smokeless tobacco: Never  Vaping Use   Vaping status: Never Used  Substance and Sexual Activity   Alcohol use: No    Alcohol/week: 0.0 standard drinks of alcohol   Drug use: No   Sexual activity: Yes    Partners: Male  Other Topics Concern   Not on file  Social History Narrative   Occupation: Works for lab   Single    Adopted daughter - 4   Moved from [place] 4 yrs ago.   Never Smoked   Alcohol use-no   Caffeine use/day:  None   Does Patient Exercise:  yes            Social Drivers of Health   Tobacco Use: Low Risk  (02/08/2024)   Patient History    Smoking Tobacco Use: Never    Smokeless Tobacco Use: Never    Passive Exposure: Not on file  Financial Resource Strain: Low Risk (02/15/2023)   Overall Financial Resource Strain (CARDIA)    Difficulty of Paying Living Expenses: Not hard at all  Food Insecurity: No Food Insecurity (02/15/2023)   Hunger Vital Sign    Worried About Running Out of Food in the Last Year: Never true    Ran Out of Food in the Last Year: Never true  Transportation Needs: No Transportation Needs (02/15/2023)   PRAPARE - Administrator, Civil Service (Medical): No    Lack of Transportation (Non-Medical): No  Physical Activity: Unknown (02/15/2023)   Exercise Vital Sign    Days of Exercise per Week: 0 days    Minutes of Exercise per Session: Not on file  Stress: No Stress Concern Present (02/15/2023)   [employer] of Occupational Health - Occupational Stress Questionnaire    Feeling of Stress : Not at all  Social Connections: Moderately Isolated (02/15/2023)   Social Connection and Isolation Panel    Frequency of Communication with Friends and Family: More than three times a week    Frequency of Social Gatherings with Friends and Family: Once a week    Attends Religious Services: More than 4 times per year    Active Member of [employer] or Organizations: No    Attends Engineer, Structural: Not on file    Marital Status: Separated  Intimate Partner Violence: Not on file  Depression (PHQ2-9): Low Risk (09/24/2023)   Depression (PHQ2-9)    PHQ-2 Score: 0  Alcohol Screen: Not on file  Housing: Low Risk (02/15/2023)   Housing Stability Vital Sign    Unable to Pay for Housing in the Last Year: No    Number of Times Moved in the Last Year: 0    Homeless in the Last Year: No  Utilities: Not on file  Health Literacy: Not on file    Physical Exam: Today's Vitals   02/08/24 0739  BP: 123/79  Pulse: 60  Temp: 98.3 F (36.8 C)  TempSrc: Temporal  SpO2: 99%  Weight:  210 lb (95.3 kg)  Height: 6' (1.829 m)   Body mass index is 28.48 kg/m. GEN: NAD EYE: Sclerae anicteric ENT: MMM CV: Non-tachycardic GI: Soft, NT/ND NEURO:  Alert & Oriented x 3  Lab Results: No results for input(s): WBC, HGB, HCT, PLT in the last 72 hours. BMET No results for input(s): NA, K, CL, CO2, GLUCOSE,  BUN, CREATININE, CALCIUM  in the last 72 hours. LFT No results for input(s): PROT, ALBUMIN, AST, ALT, ALKPHOS, BILITOT, BILIDIR, IBILI in the last 72 hours. PT/INR No results for input(s): LABPROT, INR in the last 72 hours.   Impression / Plan: This is a 49 y.o.male who presents for EGD for evaluation of abdominal pain.  The risks and benefits of endoscopic evaluation/treatment were discussed with the patient and/or family; these include but are not limited to the risk of perforation, infection, bleeding, missed lesions, lack of diagnosis, severe illness requiring hospitalization, as well as anesthesia and sedation related illnesses.  The patient's history has been reviewed, patient examined, no change in status, and deemed stable for procedure.  The patient and/or family was provided an opportunity to ask questions and all were answered.  The patient and/or family is agreeable to proceed.    Aloha Finner, MD [facility] Office # [phone]    [1]  Current Outpatient Medications:    Ferrous Sulfate (IRON  PO), Take by mouth., Disp: , Rfl:    Multiple Vitamins-Minerals (MULTIVITAMIN WITH MINERALS) tablet, Take 1 tablet by mouth daily., Disp: , Rfl:    SQ injection lenacapavir  ([name] ) 463.5 MG/1.5ML SQ injection, Inject 3 mLs (927 mg total) into the skin every 6 (six) months. Administer each injection subcutaneously at separate sites in the abdomen (more or equal to 2 inches from the navel)., Disp: 3 mL, Rfl: 1   VITAMIN D , CHOLECALCIFEROL, PO, Take by mouth., Disp: , Rfl:   Current  Facility-Administered Medications:    0.9 %  sodium chloride  infusion, 500 mL, Intravenous, Once, Mansouraty, Lawernce Earll Jr., MD [2] No Known Allergies  "

## 2024-02-08 NOTE — Op Note (Signed)
 [facility] Patient Name: Erik Weber Procedure Date: 02/08/2024 8:43 AM MRN: [id number] Endoscopist: Aloha Finner , MD, [id number] Age: 49 Referring MD:  Date of Birth: [birth year] Gender: Male Account #: [id number] Procedure:                Upper GI endoscopy Indications:              Upper abdominal pain Medicines:                Monitored Anesthesia Care Procedure:                Pre-Anesthesia Assessment:                           - Prior to the procedure, a History and Physical                            was performed, and patient medications and                            allergies were reviewed. The patient's tolerance of                            previous anesthesia was also reviewed. The risks                            and benefits of the procedure and the sedation                            options and risks were discussed with the patient.                            All questions were answered, and informed consent                            was obtained. Prior Anticoagulants: The patient has                            taken no anticoagulant or antiplatelet agents. ASA                            Grade Assessment: II - A patient with mild systemic                            disease. After reviewing the risks and benefits,                            the patient was deemed in satisfactory condition to                            undergo the procedure.                           After obtaining informed consent, the endoscope was  passed under direct vision. Throughout the                            procedure, the patient's blood pressure, pulse, and                            oxygen saturations were monitored continuously. The                            Olympus Scope [id number] was introduced through the                            mouth, and advanced to the second part of duodenum.                            The upper GI endoscopy was  accomplished without                            difficulty. The patient tolerated the procedure. Scope In: Scope Out: Findings:                 A medium scar was found in the distal esophagus.                            The scar tissue was healthy in appearance.                           No other gross lesions were noted in the entire                            esophagus.                           The Z-line was irregular and was found 39 cm from                            the incisors.                           Evidence of a Roux-en-Y gastrojejunostomy was                            found. The gastrojejunal anastomosis was                            characterized by healthy appearing mucosa. This was                            traversed. The pouch-to-jejunum limb was                            characterized by erosion, erythema and ulceration.                            The pouch is 7 cm in length.  The jejunojejunal                            anastomosis was characterized by healthy appearing                            mucosa. The duodenum-to-jejunum limb was not                            examined as it could not be reached. The excluded                            stomach was not examined as it could not be                            reached. Biopsies from the gastric pouch were taken                            with a cold forceps for histology and Helicobacter                            pylori testing.                           Normal mucosa was found in the rest fo the                            visualized jejunum. Biopsies were taken with a cold                            forceps for histology to rule out enteropathy. Complications:            No immediate complications. Estimated Blood Loss:     Estimated blood loss was minimal. Impression:               - Scars in the distal esophagus. No other gross                            lesions in the entire esophagus.                            - Z-line irregular, 39 cm from the incisors.                           - Roux-en-Y gastrojejunostomy with gastrojejunal                            anastomosis characterized by healthy appearing                            mucosa. Gastric pouch with gastritis and gastric                            ulcers. Pouch is 7 cm in length. Biopsied pouch.                           -  Normal mucosa was found in the visualized                            jejunum. Biopsied. Recommendation:           - The patient will be observed post-procedure,                            until all discharge criteria are met.                           - Discharge patient to home.                           - Patient has a contact number available for                            emergencies. The signs and symptoms of potential                            delayed complications were discussed with the                            patient. Return to normal activities tomorrow.                            Written discharge instructions were provided to the                            patient.                           - Resume previous diet.                           - Start Omeprazole  40 mg twice daily (granules).                            Open the capsule and mix with apple sauce or                            yogurt, then swallow immediately without chewing.                            Do not crush or chew the granules.                           - Continue present medications.                           - Await pathology results.                           - Repeat upper endoscopy in 3 months to check  healing.                           - If within the next 3 weeks he is still having                            issues, he should reach out via MyChart or call the                            office, and we will proceed with additional imaging                            (Abdominal U/S to further workup). If  doing better                            then still let us  know.                           - The findings and recommendations were discussed                            with the patient.                           - The findings and recommendations were discussed                            with the patient's family. Aloha Finner, MD 02/08/2024 9:08:15 AM

## 2024-02-08 NOTE — Progress Notes (Signed)
 Transferred to PACU via stretcher.  Not responding to stimulation at this time.  VSS upon leaving procedure room.

## 2024-02-11 ENCOUNTER — Telehealth: Payer: Self-pay

## 2024-02-11 NOTE — Telephone Encounter (Signed)
" °  Follow up Call-     02/08/2024    7:39 AM  Call back number  Post procedure Call Back phone  # [phone]  Permission to leave phone message Yes     Patient questions:  Do you have a fever, pain , or abdominal swelling? No. Pain Score  0 *  Have you tolerated food without any problems? Yes.    Have you been able to return to your normal activities? Yes.    Do you have any questions about your discharge instructions: Diet   No. Medications  No. Follow up visit  No.  Do you have questions or concerns about your Care? No.  Actions: * If pain score is 4 or above: No action needed, pain <4.   "

## 2024-02-12 ENCOUNTER — Ambulatory Visit: Payer: Self-pay | Admitting: Gastroenterology

## 2024-02-12 LAB — SURGICAL PATHOLOGY

## 2024-02-13 ENCOUNTER — Telehealth: Payer: Self-pay | Admitting: Gastroenterology

## 2024-02-13 NOTE — Telephone Encounter (Signed)
 PT is calling to discuss pathology results. Please advise.

## 2024-02-13 NOTE — Telephone Encounter (Signed)
 The pt has been advised that results are being reviewed and he will expect a letter with results and recommendations when available.

## 2024-02-19 ENCOUNTER — Encounter: Payer: Self-pay | Admitting: Family

## 2024-02-27 ENCOUNTER — Encounter: Payer: Self-pay | Admitting: Pharmacist

## 2024-02-29 ENCOUNTER — Ambulatory Visit: Payer: Self-pay

## 2024-02-29 ENCOUNTER — Ambulatory Visit: Admitting: Family

## 2024-02-29 VITALS — BP 131/83 | HR 64 | Temp 98.4°F | Resp 16 | Ht 72.0 in | Wt 206.0 lb

## 2024-02-29 DIAGNOSIS — H00039 Abscess of eyelid unspecified eye, unspecified eyelid: Secondary | ICD-10-CM | POA: Diagnosis not present

## 2024-02-29 DIAGNOSIS — H109 Unspecified conjunctivitis: Secondary | ICD-10-CM | POA: Diagnosis not present

## 2024-02-29 MED ORDER — CIPROFLOXACIN HCL 0.3 % OP SOLN: 2.0000 [drp] | OPHTHALMIC | 0 refills | Status: AC

## 2024-02-29 MED ORDER — CEPHALEXIN 500 MG PO CAPS: 500.0000 mg | ORAL_CAPSULE | Freq: Three times a day (TID) | ORAL | 0 refills | Status: AC

## 2024-02-29 NOTE — Progress Notes (Signed)
 "  Subjective:     Patient ID: Erik Weber, male    DOB: 1975/05/01, 49 y.o.   MRN: [id number]  Chief Complaint  Patient presents with   Eye Pain    Patient complains of right pain and swelling for 3 days    Eye Pain     Discussed the use of AI scribe software for clinical note transcription with the patient, who gave verbal consent to proceed.  History of Present Illness A 49 year old male who presents with right eye swelling and discomfort following a dental cleaning.  Three days ago, he noticed swelling in his right eye the morning after a dental cleaning, despite wearing protective glasses during the procedure. The swelling is associated with soreness rated as 4 out of 10 when touched, but there is no pain when not touching the eye.  He has been using Visine for the past couple of days without relief. His eyes are sticky in the morning, and both eyes are now affected, with the left eye starting to show similar symptoms, including slight swelling and tearing.  He experiences intermittent blurred vision but vision is OK right now.  No known sick contacts, but he mentions recent contact with his grandchildren and has inquired if he has conjunctivitis.      Health Maintenance Due  Topic Date Due   Pneumococcal Vaccine (1 of 2 - PCV) Never done   Hepatitis B Vaccines 19-59 Average Risk (1 of 3 - 19+ 3-dose series) Never done   Influenza Vaccine  09/07/2023   COVID-19 Vaccine (6 - 2025-26 season) 10/08/2023    Past Medical History:  Diagnosis Date   Chronic renal insufficiency    CKD (chronic kidney disease) stage 3, GFR 30-59 ml/min (HCC) 11/25/2018   Managed by [facility] Dr Dolan.  Secondary to hypertensive nephropathy    Disorder resulting from impaired renal function 05/01/2007   Thought due to hypertension, followed by Dr. Perri     Essential hypertension 09/21/2009   Qualifier: Diagnosis of  By: Georgian ROSALEA CHARM Lamar    Fracture of base of fifth metacarpal  bone of right hand    GERD 04/04/2007   Qualifier: Diagnosis of  By: Georgian ROSALEA CHARM Lamar    GERD (gastroesophageal reflux disease)    hx of    Hypertension    Hypertriglyceridemia 05/28/2012   Hypogonadism male 06/07/2011   Iron  deficiency anemia secondary to inadequate dietary iron  intake 12/04/2019   Morbid obesity (HCC)    Obesity 08/18/2013   OSA (obstructive sleep apnea) 10/28/2018   Pneumonitis 10/04/2018   after accidental inhalation of swimming pool water  during swimmng lesson   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   Sleep apnea    Mild, no cpap needed   Tinea versicolor 05/19/2013    Past Surgical History:  Procedure Laterality Date   ADENOIDECTOMY     childhood   GASTRIC ROUX-EN-Y N/A 10/28/2018   Procedure: LAPAROSCOPIC ROUX-EN-Y GASTRIC BYPASS WITH UPPER ENDOSCOPY;  Surgeon: Tanda Locus, MD;  Location: [facility];  Service: General;  Laterality: N/A;   HAND SURGERY Right 02/2016   LIPOSUCTION Bilateral 03/2023   Entire abdomen   UPPER GI ENDOSCOPY  03/10/2016   XI ROBOTIC ASSISTED INGUINAL HERNIA REPAIR WITH MESH Left 10/10/2019   Procedure: XI ROBOTIC ASSISTED LEFT INGUINAL HERNIA REPAIR WITH MESH;  Surgeon: Tanda Locus, MD;  Location: [facility];  Service: General;  Laterality: Left;  Family History  Problem Relation Age of Onset   Kidney cancer Mother 87   Hypertension Father        age 58   Diabetes Father    Lung cancer Paternal Uncle    Colon cancer Neg Hx    Prostate cancer Neg Hx    Liver disease Neg Hx    Esophageal cancer Neg Hx    Stomach cancer Neg Hx    Pancreatic cancer Neg Hx     Social History   Socioeconomic History   Marital status: Married    Spouse name: Not on file   Number of children: 1   Years of education: 16   Highest education level: Bachelor's degree (e.g., BA, AB, BS)  Occupational History   Occupation: aeronautical engineer  Tobacco Use   Smoking status: Never   Smokeless tobacco: Never   Vaping Use   Vaping status: Never Used  Substance and Sexual Activity   Alcohol use: No    Alcohol/week: 0.0 standard drinks of alcohol   Drug use: No   Sexual activity: Yes    Partners: Male  Other Topics Concern   Not on file  Social History Narrative   Occupation: Works for lab   Single    Adopted daughter - 4   Moved from [place] 4 yrs ago.   Never Smoked   Alcohol use-no   Caffeine use/day:  None   Does Patient Exercise:  yes            Social Drivers of Health   Tobacco Use: Low Risk (02/08/2024)   Patient History    Smoking Tobacco Use: Never    Smokeless Tobacco Use: Never    Passive Exposure: Not on file  Financial Resource Strain: Low Risk (02/15/2023)   Overall Financial Resource Strain (CARDIA)    Difficulty of Paying Living Expenses: Not hard at all  Food Insecurity: No Food Insecurity (02/15/2023)   Hunger Vital Sign    Worried About Running Out of Food in the Last Year: Never true    Ran Out of Food in the Last Year: Never true  Transportation Needs: No Transportation Needs (02/15/2023)   PRAPARE - Administrator, Civil Service (Medical): No    Lack of Transportation (Non-Medical): No  Physical Activity: Unknown (02/15/2023)   Exercise Vital Sign    Days of Exercise per Week: 0 days    Minutes of Exercise per Session: Not on file  Stress: No Stress Concern Present (02/15/2023)   [employer] of Occupational Health - Occupational Stress Questionnaire    Feeling of Stress : Not at all  Social Connections: Moderately Isolated (02/15/2023)   Social Connection and Isolation Panel    Frequency of Communication with Friends and Family: More than three times a week    Frequency of Social Gatherings with Friends and Family: Once a week    Attends Religious Services: More than 4 times per year    Active Member of [employer] or Organizations: No    Attends Banker Meetings: Not on file    Marital Status: Separated  Intimate Partner Violence: Not  on file  Depression (PHQ2-9): Low Risk (02/29/2024)   Depression (PHQ2-9)    PHQ-2 Score: 3  Alcohol Screen: Not on file  Housing: Low Risk (02/15/2023)   Housing Stability Vital Sign    Unable to Pay for Housing in the Last Year: No    Number of Times Moved in the Last Year: 0  Homeless in the Last Year: No  Utilities: Not on file  Health Literacy: Not on file    Outpatient Medications Prior to Visit  Medication Sig Dispense Refill   Ferrous Sulfate (IRON  PO) Take by mouth.     Multiple Vitamins-Minerals (MULTIVITAMIN WITH MINERALS) tablet Take 1 tablet by mouth daily.     omeprazole  (PRILOSEC) 40 MG capsule Take 1 capsule (40 mg total) by mouth 2 (two) times daily. 180 capsule 3   SQ injection lenacapavir  ([name] ) 463.5 MG/1.5ML SQ injection Inject 3 mLs (927 mg total) into the skin every 6 (six) months. Administer each injection subcutaneously at separate sites in the abdomen (more or equal to 2 inches from the navel). 3 mL 1   VITAMIN D , CHOLECALCIFEROL, PO Take by mouth.     Facility-Administered Medications Prior to Visit  Medication Dose Route Frequency Provider Last Rate Last Admin   0.9 %  sodium chloride  infusion  500 mL Intravenous Once Mansouraty, Gabriel Jr., MD        Allergies[1]  Review of Systems  Eyes:  Positive for pain.   See HPI    Objective:    Physical Exam Constitutional:      General: He is not in acute distress.    Appearance: He is well-developed.  HENT:     Head: Normocephalic and atraumatic.  Eyes:     Extraocular Movements: Extraocular movements intact.     Conjunctiva/sclera:     Right eye: Right conjunctiva is injected (R>L). No exudate or hemorrhage.    Left eye: Left conjunctiva is injected. No hemorrhage.    Pupils: Pupils are equal, round, and reactive to light.     Comments: + swelling and tenderness of right upper eyelid (most notable laterally).   Pulmonary:     Effort: Pulmonary effort is normal.  Skin:    General: Skin is  warm and dry.  Neurological:     Mental Status: He is alert and oriented to person, place, and time.  Psychiatric:        Behavior: Behavior normal.        Thought Content: Thought content normal.      BP 131/83 (BP Location: Right Arm, Patient Position: Sitting, Cuff Size: Large)   Pulse 64   Temp 98.4 F (36.9 C) (Oral)   Resp 16   Ht 6' (1.829 m)   Wt 206 lb (93.4 kg)   SpO2 100%   BMI 27.94 kg/m  Wt Readings from Last 3 Encounters:  02/29/24 206 lb (93.4 kg)  02/08/24 210 lb (95.3 kg)  01/29/24 210 lb (95.3 kg)       Assessment & Plan:   Problem List Items Addressed This Visit   None Visit Diagnoses       Cellulitis of eyelid    -  Primary   Relevant Medications   ciprofloxacin  (CILOXAN ) 0.3 % ophthalmic solution     Conjunctivitis of both eyes, unspecified conjunctivitis type       Relevant Medications   cephALEXin  (KEFLEX ) 500 MG capsule      Acute bilateral conjunctivitis with right eyelid cellulitis. Possible viral or bacterial etiology. Differential includes sty. - Prescribed antibiotic eye drops: two drops to each eye every four hours while awake for five days. - Prescribed oral Keflex  TID for seven days. - Advised warm compresses to the eyelid. - Instructed to seek emergency care if severe pain, sudden vision changes, or swelling extends beyond the eyelid. Assessment & Plan     I  am having Theo HERO. Mathias start on ciprofloxacin  and cephALEXin . I am also having him maintain his multivitamin with minerals, (VITAMIN D , CHOLECALCIFEROL, PO), Ferrous Sulfate (IRON  PO), [name] , and omeprazole . We will continue to administer sodium chloride .  Meds ordered this encounter  Medications   ciprofloxacin  (CILOXAN ) 0.3 % ophthalmic solution    Sig: Place 2 drops into both eyes every 4 (four) hours while awake for 5 days. Administer 1 drop, every 2 hours, while awake, for 2 days. Then 1 drop, every 4 hours, while awake, for the next 5 days.    Dispense:  5 mL     Refill:  0    Supervising Provider:   DOMENICA BLACKBIRD A [[id number]]   cephALEXin  (KEFLEX ) 500 MG capsule    Sig: Take 1 capsule (500 mg total) by mouth 3 (three) times daily.    Dispense:  21 capsule    Refill:  0    Supervising Provider:   DOMENICA BLACKBIRD A [[id number]]      [1] No Known Allergies  "

## 2024-02-29 NOTE — Patient Instructions (Signed)
" °  VISIT SUMMARY: You came in today because of swelling and discomfort in your right eye that started after a dental cleaning. The swelling has spread to your left eye, and you have been experiencing intermittent blurred vision.  YOUR PLAN: -BILATERAL CONJUNCTIVITIS AND RIGHT EYELID CELLULITIS: You have an infection in both eyes, known as conjunctivitis, and an infection in your right eyelid, known as cellulitis. This could be caused by either a virus or bacteria. You have been prescribed antibiotic eye drops to use in both eyes every four hours while you are awake for the next five days. Additionally, you will take oral antibiotics (Keflex ) three times a day for seven days. Applying warm compresses to your eyelid will help with the swelling. If you experience severe pain, sudden changes in vision, or if the swelling spreads beyond your eyelid, you should seek emergency care immediately.  INSTRUCTIONS: Please follow up if your symptoms do not improve or worsen after completing the prescribed medications. If you experience any severe symptoms as mentioned, seek emergency care immediately.    Contains text generated by [employer].   "

## 2024-02-29 NOTE — Telephone Encounter (Signed)
 Appt scheduled

## 2024-02-29 NOTE — Progress Notes (Signed)
" ° °  Established Patient Office Visit  Subjective   Patient ID: Erik Weber, male    DOB: 1975-04-27  Age: 49 y.o. MRN: [id number]  Chief Complaint  Patient presents with   Eye Pain    Patient complains of right pain and swelling for 3 days    Eye Pain    Patient in office today for right eye pain for three days. Patient awoke Wednesday morning with right eye soreness noticed it was red and painful to touch 4/10 with no pain if he was not touching it. Has tried visine eye drops with no relief and believes he may have spread possible infection to the left eye. Woke up this morning and both eyes felt gooey to touch.   Review of Systems  Constitutional: Negative.   Eyes:  Positive for pain.  Respiratory: Negative.    Cardiovascular: Negative.      Objective:     BP 131/83 (BP Location: Right Arm, Patient Position: Sitting, Cuff Size: Large)   Pulse 64   Temp 98.4 F (36.9 C) (Oral)   Resp 16   Ht 6' (1.829 m)   Wt 206 lb (93.4 kg)   SpO2 100%   BMI 27.94 kg/m   Physical Exam Constitutional:      Appearance: Normal appearance.  HENT:     Head: Normocephalic.  Eyes:     General:        Right eye: Discharge present.        Left eye: Discharge present.    Comments: Eyes noted to be red with clear drainage noted. Right eyelid swollen   Cardiovascular:     Rate and Rhythm: Normal rate and regular rhythm.     Heart sounds: Normal heart sounds.  Pulmonary:     Effort: Pulmonary effort is normal.     Breath sounds: Normal breath sounds.  Neurological:     Mental Status: He is alert.      Assessment & Plan:  Conjunctivitis  -Keflex rx  Cellulitis of eyelid -Ciprofloxacin  drops rx  Patient Education  Patient encouraged to go to ER if infection spreading to outside of the eyes, severe pain, or vision changes.   Follow up in 3 months or sooner if symptoms worsen or do not improve.   Levon Budd, FNP- Student   "

## 2024-02-29 NOTE — Telephone Encounter (Signed)
 FYI Only or Action Required?: FYI only for provider: appointment scheduled on 1/23.  Patient was last seen in primary care on 09/24/2023 by Copland, Harlene BROCKS, MD.  Called Nurse Triage reporting No chief complaint on file..  Symptoms began several days ago.  Interventions attempted: OTC medications: eye gtts.  Symptoms are: gradually worsening.  Triage Disposition: No disposition on file.  Patient/caregiver understands and will follow disposition?:   Reason for Triage: soreness in right eye, swollen in right eye and drainage in both.  Reason for Disposition  MODERATE eye pain or discomfort (e.g., interferes with normal activities or awakens from sleep; more than mild)  Answer Assessment - Initial Assessment Questions 1. ONSET: When did the pain start? (e.g., minutes, hours, days)     Few days 2. TIMING: Does the pain come and go, or has it been constant since it started? (e.g., constant, intermittent, fleeting)     Intermittent 3. SEVERITY: How bad is the pain?  (Scale 1-10; mild, moderate or severe)     moderate 4. LOCATION: Where does it hurt?  (e.g., eyelid, eye, cheekbone)     Rt eye 5. CAUSE: What do you think is causing the pain?     unknown 6. VISION: Do you have blurred vision or changes in your vision?      denies 7. EYE DISCHARGE: Is there any discharge (pus) from the eye(s)?  If Yes, ask: What color is it?      Sticky white drainage from both eyes 8. FEVER: Do you have a fever? If Yes, ask: What is it, how was it measured, and when did it start?      no 9. OTHER SYMPTOMS: Do you have any other symptoms? (e.g., headache, nasal discharge, facial rash)     no  Protocols used: Eye Pain and Other Symptoms-A-AH

## 2024-03-06 ENCOUNTER — Other Ambulatory Visit: Payer: Self-pay | Admitting: General Surgery

## 2024-03-06 DIAGNOSIS — R1013 Epigastric pain: Secondary | ICD-10-CM

## 2024-03-06 NOTE — Telephone Encounter (Signed)
 See above  See above.

## 2024-03-07 ENCOUNTER — Ambulatory Visit: Admitting: Gastroenterology

## 2024-03-14 ENCOUNTER — Encounter: Payer: Self-pay | Admitting: Family

## 2024-03-19 ENCOUNTER — Other Ambulatory Visit

## 2024-04-09 ENCOUNTER — Ambulatory Visit: Admitting: Pharmacist
